# Patient Record
Sex: Female | Born: 1980 | Race: White | Hispanic: No | Marital: Single | Smoking: Current every day smoker
Health system: Southern US, Community
[De-identification: ages and names within clinical notes are randomized; demographics above are authoritative.]

## PROBLEM LIST (undated history)

## (undated) DIAGNOSIS — M5136 Other intervertebral disc degeneration, lumbar region: Secondary | ICD-10-CM

## (undated) DIAGNOSIS — G4700 Insomnia, unspecified: Secondary | ICD-10-CM

## (undated) DIAGNOSIS — F32A Depression, unspecified: Secondary | ICD-10-CM

## (undated) DIAGNOSIS — J189 Pneumonia, unspecified organism: Secondary | ICD-10-CM

## (undated) DIAGNOSIS — F329 Major depressive disorder, single episode, unspecified: Secondary | ICD-10-CM

## (undated) DIAGNOSIS — F419 Anxiety disorder, unspecified: Secondary | ICD-10-CM

## (undated) DIAGNOSIS — M51369 Other intervertebral disc degeneration, lumbar region without mention of lumbar back pain or lower extremity pain: Secondary | ICD-10-CM

## (undated) DIAGNOSIS — M5126 Other intervertebral disc displacement, lumbar region: Secondary | ICD-10-CM

## (undated) DIAGNOSIS — K8020 Calculus of gallbladder without cholecystitis without obstruction: Secondary | ICD-10-CM

## (undated) DIAGNOSIS — K8010 Calculus of gallbladder with chronic cholecystitis without obstruction: Secondary | ICD-10-CM

## (undated) DIAGNOSIS — Z8619 Personal history of other infectious and parasitic diseases: Secondary | ICD-10-CM

## (undated) HISTORY — PX: CHOLECYSTECTOMY: SHX55

## (undated) HISTORY — PX: BACK SURGERY: SHX140

## (undated) HISTORY — DX: Personal history of other infectious and parasitic diseases: Z86.19

---

## 2002-04-03 ENCOUNTER — Ambulatory Visit (HOSPITAL_COMMUNITY): Admission: RE | Admit: 2002-04-03 | Discharge: 2002-04-03 | Payer: Self-pay | Admitting: Family Medicine

## 2002-04-03 ENCOUNTER — Encounter: Payer: Self-pay | Admitting: Family Medicine

## 2002-09-04 ENCOUNTER — Emergency Department (HOSPITAL_COMMUNITY): Admission: EM | Admit: 2002-09-04 | Discharge: 2002-09-04 | Payer: Self-pay | Admitting: Emergency Medicine

## 2003-02-21 ENCOUNTER — Encounter: Payer: Self-pay | Admitting: Family Medicine

## 2003-02-21 ENCOUNTER — Encounter: Admission: RE | Admit: 2003-02-21 | Discharge: 2003-02-21 | Payer: Self-pay | Admitting: Family Medicine

## 2003-02-27 ENCOUNTER — Other Ambulatory Visit: Admission: RE | Admit: 2003-02-27 | Discharge: 2003-02-27 | Payer: Self-pay | Admitting: Family Medicine

## 2005-03-21 ENCOUNTER — Emergency Department (HOSPITAL_COMMUNITY): Admission: EM | Admit: 2005-03-21 | Discharge: 2005-03-21 | Payer: Self-pay | Admitting: Emergency Medicine

## 2005-04-16 ENCOUNTER — Emergency Department (HOSPITAL_COMMUNITY): Admission: EM | Admit: 2005-04-16 | Discharge: 2005-04-16 | Payer: Self-pay | Admitting: Emergency Medicine

## 2006-02-19 ENCOUNTER — Emergency Department (HOSPITAL_COMMUNITY): Admission: EM | Admit: 2006-02-19 | Discharge: 2006-02-19 | Payer: Self-pay | Admitting: Emergency Medicine

## 2007-03-17 ENCOUNTER — Emergency Department (HOSPITAL_COMMUNITY): Admission: EM | Admit: 2007-03-17 | Discharge: 2007-03-17 | Payer: Self-pay | Admitting: Emergency Medicine

## 2007-06-21 ENCOUNTER — Ambulatory Visit: Payer: Self-pay | Admitting: Internal Medicine

## 2007-06-21 LAB — CONVERTED CEMR LAB
AST: 20 units/L (ref 0–37)
Albumin: 4.1 g/dL (ref 3.5–5.2)
Alkaline Phosphatase: 56 units/L (ref 39–117)
BUN: 13 mg/dL (ref 6–23)
Basophils Absolute: 0.1 10*3/uL (ref 0.0–0.1)
Calcium: 9.3 mg/dL (ref 8.4–10.5)
Chloride: 107 meq/L (ref 96–112)
Creatinine, Ser: 0.7 mg/dL (ref 0.4–1.2)
Eosinophils Absolute: 0.3 10*3/uL (ref 0.0–0.6)
GFR calc non Af Amer: 108 mL/min
HCT: 43.7 % (ref 36.0–46.0)
MCHC: 34.3 g/dL (ref 30.0–36.0)
MCV: 89.7 fL (ref 78.0–100.0)
Monocytes Relative: 9.3 % (ref 3.0–11.0)
Platelets: 451 10*3/uL — ABNORMAL HIGH (ref 150–400)
RBC: 4.87 M/uL (ref 3.87–5.11)
RDW: 13.2 % (ref 11.5–14.6)
TSH: 2.14 microintl units/mL (ref 0.35–5.50)
Total Bilirubin: 0.9 mg/dL (ref 0.3–1.2)

## 2007-06-28 ENCOUNTER — Encounter: Admission: RE | Admit: 2007-06-28 | Discharge: 2007-06-28 | Payer: Self-pay | Admitting: Internal Medicine

## 2007-06-30 DIAGNOSIS — M545 Low back pain: Secondary | ICD-10-CM

## 2007-07-16 ENCOUNTER — Emergency Department (HOSPITAL_COMMUNITY): Admission: EM | Admit: 2007-07-16 | Discharge: 2007-07-16 | Payer: Self-pay | Admitting: Emergency Medicine

## 2007-08-27 ENCOUNTER — Other Ambulatory Visit: Admission: RE | Admit: 2007-08-27 | Discharge: 2007-08-27 | Payer: Self-pay | Admitting: Internal Medicine

## 2008-11-13 ENCOUNTER — Emergency Department (HOSPITAL_COMMUNITY): Admission: EM | Admit: 2008-11-13 | Discharge: 2008-11-13 | Payer: Self-pay | Admitting: Emergency Medicine

## 2009-10-08 ENCOUNTER — Emergency Department (HOSPITAL_COMMUNITY): Admission: EM | Admit: 2009-10-08 | Discharge: 2009-10-08 | Payer: Self-pay | Admitting: Emergency Medicine

## 2010-03-01 ENCOUNTER — Emergency Department (HOSPITAL_COMMUNITY): Admission: EM | Admit: 2010-03-01 | Discharge: 2010-03-02 | Payer: Self-pay | Admitting: Emergency Medicine

## 2010-03-06 ENCOUNTER — Emergency Department (HOSPITAL_COMMUNITY): Admission: EM | Admit: 2010-03-06 | Discharge: 2010-03-06 | Payer: Self-pay | Admitting: Emergency Medicine

## 2010-04-09 ENCOUNTER — Emergency Department: Payer: Self-pay | Admitting: Emergency Medicine

## 2010-08-19 ENCOUNTER — Emergency Department: Payer: Self-pay | Admitting: Emergency Medicine

## 2010-08-20 ENCOUNTER — Emergency Department (HOSPITAL_COMMUNITY): Admission: EM | Admit: 2010-08-20 | Discharge: 2010-08-20 | Payer: Self-pay | Admitting: Emergency Medicine

## 2010-10-04 ENCOUNTER — Inpatient Hospital Stay (HOSPITAL_COMMUNITY): Admission: AD | Admit: 2010-10-04 | Discharge: 2010-10-04 | Payer: Self-pay | Admitting: Obstetrics & Gynecology

## 2011-01-07 ENCOUNTER — Ambulatory Visit
Admission: RE | Admit: 2011-01-07 | Discharge: 2011-01-07 | Payer: Self-pay | Source: Home / Self Care | Attending: Internal Medicine | Admitting: Internal Medicine

## 2011-01-07 DIAGNOSIS — IMO0002 Reserved for concepts with insufficient information to code with codable children: Secondary | ICD-10-CM | POA: Insufficient documentation

## 2011-01-07 DIAGNOSIS — M5137 Other intervertebral disc degeneration, lumbosacral region: Secondary | ICD-10-CM | POA: Insufficient documentation

## 2011-01-15 NOTE — Assessment & Plan Note (Signed)
Summary: LAST APPT:  2008--BACK PAIN--STC   Vital Signs:  Patient profile:   30 year old female Height:      66 inches Weight:      194.50 pounds BMI:     31.51 O2 Sat:      97 % on Room air Temp:     98.5 degrees F oral Pulse rate:   121 / minute BP sitting:   132 / 80  (left arm) Cuff size:   regular  Vitals Entered By: Zella Ball Ewing CMA (AAMA) (January 07, 2011 3:04 PM)  O2 Flow:  Room air CC: Low Back Pain/RE   CC:  Low Back Pain/RE.  History of Present Illness: here to re-establish with recurrent episode of LBP , first episode since lumbar surgury in 2008, now 3 days severe, constant, persistent, left sided with radicaular pain to the calf; no numbness but ? mild worsening weakness (but was not back to 100% after the first surgury);  No fever, trauma, wt loss but does do fairfly freq bending with job. No falls.  No bowel or bladder changes.  Pt denies CP, worsening sob, doe, wheezing, orthopnea, pnd, worsening LE edema, palps, dizziness or syncope  Pt denies other new neuro symptoms such as headache, facial weakness.  standing makes better, sitting for over several mintues makes worse.  There are mult family members, cousins et al with similar issues and hx of lumbar surgury in their 20's  Preventive Screening-Counseling & Management  Alcohol-Tobacco     Smoking Status: current      Drug Use:  no.    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 mother with HTN grandmother with lung cancer grandfather with heart disease - died at 30yo  Social History: Reviewed history and no changes required. 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   Head:  normocephalic and atraumatic.   Eyes:  vision grossly intact, pupils equal, and pupils round.   Ears:  R ear normal and L ear normal.   Nose:  no external deformity and no nasal discharge.   Mouth:  no gingival abnormalities and pharynx pink and moist.   Neck:  supple and no masses.   Lungs:  normal respiratory effort and normal breath sounds.   Heart:  normal rate and regular rhythm.   Abdomen:  soft,  non-tender, and normal bowel sounds.   Msk:  no joint tenderness and no joint swelling.  , no lumbar or paravertebral tenderness, swelling , erythema or rash Extremities:  no edema, no erythema  Neurologic:  cranial nerves II-XII intact, strength normal in all extremities, sensation intact to light touch, and DTRs symmetrical and normal.  except for very mild LLE diffuse weakness, difficult to asses due to pain Psych:  not depressed appearing and slightly anxious.     Impression & Recommendations:  Problem # 1:  LUMBAR RADICULOPATHY, LEFT (ICD-724.4) acute, recurrent episode similar to prior 2008 culminating in surgury; suspect recurrent disc herniation ;  ok for  pain med, but ideally should have MRI and ortho referral;  pt will call her insurance to see if will cover the MRI and let us know as she has "partial insurance" with her current part time job, and to find out name of former ortho at [facility] who did so well for her with the previous surgury; so that we can order the MRI and do the referral which are appropriate  Her updated medication list for this problem includes:    Oxycodone Hcl 5 Mg Tabs (Oxycodone hcl) .Marland Kitchen... 1-2 by mouth q 6 hrs as needed pain  Complete Medication List: 1)  Oxycodone Hcl 5 Mg Tabs (Oxycodone hcl) .Marland Kitchen.. 1-2 by mouth q 6 hrs as needed pain  Patient Instructions: 1)  Please take all new medications as prescribed 2)  Continue all previous medications as before this visit  3)  Please let us know if you would like me to order the MRI, or have it done at [facility] 4)  Please let us know the name of the orthopedic at [facility] so that we can do the referral 5)  Continue all previous medications as before this visit  6)  Please schedule a follow-up appointment as needed. Prescriptions: OXYCODONE HCL 5 MG TABS (OXYCODONE HCL) 1-2 by mouth q 6 hrs as needed pain  #60 x 0   Entered and Authorized by:   Corwin Levins MD   Signed by:   Corwin Levins MD on 01/07/2011   Method used:   Print then Give to Patient   RxID:   [id number]    Orders Added: 1)  New Patient Level IV [86578]

## 2011-01-19 ENCOUNTER — Telehealth: Payer: Self-pay | Admitting: Internal Medicine

## 2011-01-20 ENCOUNTER — Encounter: Payer: Self-pay | Admitting: Internal Medicine

## 2011-02-03 ENCOUNTER — Telehealth: Payer: Self-pay | Admitting: Internal Medicine

## 2011-02-04 NOTE — Progress Notes (Signed)
Summary: MRI, Refill  Phone Note Call from Patient Call back at Home Phone [phone]   Caller: Patient Summary of Call: Pt called requesting referral to have MRI done (not at [facility]) in 6 weeks when her Insurance will be active. Pt is also requesting a refill of Oxycodone. Initial call taken by: Margaret Pyle, CMA,  January 19, 2011 2:46 PM  Follow-up for Phone Call        ok for refill now;  pt should call at the approp time and I will order the MRI Follow-up by: Corwin Levins MD,  January 19, 2011 4:59 PM  Additional Follow-up for Phone Call Additional follow up Details #1::        both pt's home and work numbers are disconnected. Rx in cabinet for pick up. Margaret Pyle, CMA  January 20, 2011 8:10 AM     Prescriptions: OXYCODONE HCL 5 MG TABS (OXYCODONE HCL) 1-2 by mouth q 6 hrs as needed pain  #80 x 0   Entered and Authorized by:   Corwin Levins MD   Signed by:   Corwin Levins MD on 01/19/2011   Method used:   Print then Give to Patient   RxID:   [id number]

## 2011-02-10 NOTE — Medication Information (Signed)
Summary: Rx History  Rx History   Imported By: Lester Alma 02/05/2011 11:01:53  _____________________________________________________________________  External Attachment:    Type:   Image     Comment:   External Document

## 2011-02-12 ENCOUNTER — Ambulatory Visit: Payer: Self-pay | Admitting: Internal Medicine

## 2011-02-12 DIAGNOSIS — Z0289 Encounter for other administrative examinations: Secondary | ICD-10-CM

## 2011-02-19 NOTE — Progress Notes (Signed)
Summary: Oxycodone  Phone Note Call from Patient Call back at Home Phone [phone]   Caller: Patient Summary of Call: Pt called requesting refill of Oxycodone Initial call taken by: Margaret Pyle, CMA,  February 03, 2011 2:37 PM  Follow-up for Phone Call        has the MRI been done? Follow-up by: Corwin Levins MD,  February 03, 2011 3:01 PM  Additional Follow-up for Phone Call Additional follow up Details #1::        Pt stated 02/06 that her Insurance will be active in 6 weeks Additional Follow-up by: Margaret Pyle, CMA,  February 03, 2011 3:13 PM    Additional Follow-up for Phone Call Additional follow up Details #2::    I am uncomfortable with continuing pain med as before for another 6 wks   can pt provide documentation that her insurance will be "active" on a specific date (such as a note from an HR person?) Follow-up by: Corwin Levins MD,  February 03, 2011 5:46 PM  Additional Follow-up for Phone Call Additional follow up Details #3:: Details for Additional Follow-up Action Taken: Pt advised and will request above information form her HR dept. Margaret Pyle, CMA  February 04, 2011 10:52 AM   Pt called stating that she has ben laid off and no longer has full insurance (partial insurance from other part time job only, that will not cover MRI per pt). Pt states she will check if insurance will cover Ortho but she says she does not think so. Pt is requesting Rx of oxycodone until she is "able to figure things out because this is affecting my quality if life". Please advise. Margaret Pyle, CMA  February 09, 2011 4:31 PM   Prescriptions: OXYCODONE HCL 5 MG TABS (OXYCODONE HCL) 1-2 by mouth q 6 hrs as needed pain  #80 x 0   Entered and Authorized by:   Corwin Levins MD   Signed by:   Corwin Levins MD on 02/09/2011   Method used:   Print then Give to Patient   RxID:   [id number]   ok for one med refill only, then we have to insist on  tapering off (such as change to tramadol with next refill)  as I cannot continue pain meds she has indefinitely without further eval and tx, as this is not good medical practice Corwin Levins MD  February 09, 2011 5:07 PM   left message on machine for pt to return my call. Margaret Pyle, CMA  February 10, 2011 8:39 AM  Pt advised ain detail and expressed understanding, Rx in cabinet for pt pick up Margaret Pyle, CMA  February 10, 2011 11:33 AM

## 2011-02-25 LAB — HCG, QUANTITATIVE, PREGNANCY: hCG, Beta Chain, Quant, S: 434 m[IU]/mL — ABNORMAL HIGH (ref <5)

## 2011-07-26 ENCOUNTER — Emergency Department (HOSPITAL_COMMUNITY)
Admission: EM | Admit: 2011-07-26 | Discharge: 2011-07-26 | Disposition: A | Discharge status: Home / Self Care | Payer: PRIVATE HEALTH INSURANCE | Attending: Emergency Medicine | Admitting: Emergency Medicine

## 2011-07-26 DIAGNOSIS — L0231 Cutaneous abscess of buttock: Secondary | ICD-10-CM | POA: Insufficient documentation

## 2011-07-30 LAB — WOUND CULTURE

## 2011-11-19 LAB — OB RESULTS CONSOLE GC/CHLAMYDIA
Chlamydia: NEGATIVE
Gonorrhea: NEGATIVE

## 2011-11-19 LAB — OB RESULTS CONSOLE ABO/RH

## 2011-11-19 LAB — OB RESULTS CONSOLE ANTIBODY SCREEN: Antibody Screen: NEGATIVE

## 2011-11-19 LAB — OB RESULTS CONSOLE RUBELLA ANTIBODY, IGM: Rubella: IMMUNE

## 2011-11-19 LAB — OB RESULTS CONSOLE HEPATITIS B SURFACE ANTIGEN: Hepatitis B Surface Ag: NEGATIVE

## 2011-12-15 NOTE — L&D Delivery Note (Signed)
Patient was C/C/2 and pushed for approx 2 hrs with epidural.   NSVD female infant, Apgars 8/9, weight 7#10.   The patient had a second laceration repaired with 2-0 vicryl. Fundus was firm. EBL approx 400cc Placenta was delivered, with abundant meconium stained membranes found adherent to uterine wall.  Manual removal performed. Vagina was clear.  Baby was vigorous to bedside.  Crystal Acosta

## 2012-05-02 LAB — OB RESULTS CONSOLE GBS: GBS: NEGATIVE

## 2012-05-31 ENCOUNTER — Encounter (HOSPITAL_COMMUNITY): Payer: Self-pay | Admitting: *Deleted

## 2012-05-31 ENCOUNTER — Telehealth (HOSPITAL_COMMUNITY): Payer: Self-pay | Admitting: *Deleted

## 2012-05-31 NOTE — Telephone Encounter (Signed)
Preadmission screen  

## 2012-06-05 ENCOUNTER — Encounter (HOSPITAL_COMMUNITY): Payer: Self-pay

## 2012-06-05 ENCOUNTER — Inpatient Hospital Stay (HOSPITAL_COMMUNITY)
Admission: RE | Admit: 2012-06-05 | Discharge: 2012-06-08 | DRG: 774 | Disposition: A | Discharge status: Home / Self Care | Payer: Medicaid Other | Source: Ambulatory Visit | Attending: Obstetrics and Gynecology | Admitting: Obstetrics and Gynecology

## 2012-06-05 VITALS — BP 98/66 | HR 85 | Temp 97.7°F | Resp 18 | Ht 66.0 in | Wt 222.0 lb

## 2012-06-05 DIAGNOSIS — O411090 Infection of amniotic sac and membranes, unspecified, unspecified trimester, not applicable or unspecified: Secondary | ICD-10-CM | POA: Diagnosis present

## 2012-06-05 DIAGNOSIS — O480 Post-term pregnancy: Secondary | ICD-10-CM

## 2012-06-05 LAB — TYPE AND SCREEN
ABO/RH(D): O POS
Antibody Screen: NEGATIVE

## 2012-06-05 LAB — CBC
MCV: 87.7 fL (ref 78.0–100.0)
Platelets: 378 10*3/uL (ref 150–400)
RBC: 4.47 MIL/uL (ref 3.87–5.11)
WBC: 14.8 10*3/uL — ABNORMAL HIGH (ref 4.0–10.5)

## 2012-06-05 MED ORDER — IBUPROFEN 600 MG PO TABS: 600.0000 mg | ORAL_TABLET | Freq: Four times a day (QID) | ORAL | Status: DC | PRN

## 2012-06-05 MED ORDER — MISOPROSTOL 25 MCG QUARTER TABLET
25.0000 ug | ORAL_TABLET | ORAL | Status: DC | PRN
  Administered 2012-06-05: 25 ug via VAGINAL
  Filled 2012-06-05: qty 1
  Filled 2012-06-05: qty 0.25

## 2012-06-05 MED ORDER — LACTATED RINGERS IV SOLN
INTRAVENOUS | Status: DC
  Administered 2012-06-05 – 2012-06-06 (×2): via INTRAVENOUS
  Administered 2012-06-06: 125 mL/h via INTRAVENOUS

## 2012-06-05 MED ORDER — ZOLPIDEM TARTRATE 10 MG PO TABS
10.0000 mg | ORAL_TABLET | Freq: Every evening | ORAL | Status: DC | PRN
  Administered 2012-06-05: 10 mg via ORAL
  Filled 2012-06-05: qty 1

## 2012-06-05 MED ORDER — LIDOCAINE HCL (PF) 1 % IJ SOLN
30.0000 mL | INTRAMUSCULAR | Status: DC | PRN
  Filled 2012-06-05: qty 30

## 2012-06-05 MED ORDER — ACETAMINOPHEN 325 MG PO TABS
650.0000 mg | ORAL_TABLET | ORAL | Status: DC | PRN
  Administered 2012-06-06 – 2012-06-07 (×2): 650 mg via ORAL
  Filled 2012-06-05 (×2): qty 2

## 2012-06-05 MED ORDER — CITRIC ACID-SODIUM CITRATE 334-500 MG/5ML PO SOLN: 30.0000 mL | ORAL | Status: DC | PRN

## 2012-06-05 MED ORDER — ONDANSETRON HCL 4 MG/2ML IJ SOLN
4.0000 mg | Freq: Four times a day (QID) | INTRAMUSCULAR | Status: DC | PRN
  Administered 2012-06-06 (×2): 4 mg via INTRAVENOUS
  Filled 2012-06-05 (×2): qty 2

## 2012-06-05 MED ORDER — OXYTOCIN BOLUS FROM INFUSION
250.0000 mL | Freq: Once | INTRAVENOUS | Status: AC
  Administered 2012-06-06: 250 mL via INTRAVENOUS
  Filled 2012-06-05: qty 500

## 2012-06-05 MED ORDER — LACTATED RINGERS IV SOLN
500.0000 mL | INTRAVENOUS | Status: DC | PRN
  Administered 2012-06-06 (×2): 500 mL via INTRAVENOUS

## 2012-06-05 MED ORDER — OXYTOCIN 40 UNITS IN LACTATED RINGERS INFUSION - SIMPLE MED
62.5000 mL/h | Freq: Once | INTRAVENOUS | Status: AC
  Administered 2012-06-06: 2.5 [IU]/h via INTRAVENOUS
  Filled 2012-06-05: qty 1000

## 2012-06-05 MED ORDER — FLEET ENEMA 7-19 GM/118ML RE ENEM: 1.0000 | ENEMA | RECTAL | Status: DC | PRN

## 2012-06-05 MED ORDER — OXYCODONE-ACETAMINOPHEN 5-325 MG PO TABS
1.0000 | ORAL_TABLET | ORAL | Status: DC | PRN
  Administered 2012-06-07 (×2): 1 via ORAL
  Filled 2012-06-05 (×2): qty 1

## 2012-06-06 ENCOUNTER — Encounter (HOSPITAL_COMMUNITY): Payer: Self-pay | Admitting: Anesthesiology

## 2012-06-06 ENCOUNTER — Inpatient Hospital Stay (HOSPITAL_COMMUNITY): Payer: Medicaid Other | Admitting: Anesthesiology

## 2012-06-06 ENCOUNTER — Encounter (HOSPITAL_COMMUNITY): Payer: Self-pay

## 2012-06-06 MED ORDER — PHENYLEPHRINE 40 MCG/ML (10ML) SYRINGE FOR IV PUSH (FOR BLOOD PRESSURE SUPPORT): 80.0000 ug | PREFILLED_SYRINGE | INTRAVENOUS | Status: DC | PRN

## 2012-06-06 MED ORDER — TERBUTALINE SULFATE 1 MG/ML IJ SOLN: 0.2500 mg | Freq: Once | INTRAMUSCULAR | Status: AC | PRN

## 2012-06-06 MED ORDER — DIPHENHYDRAMINE HCL 50 MG/ML IJ SOLN: 12.5000 mg | INTRAMUSCULAR | Status: DC | PRN

## 2012-06-06 MED ORDER — BUTORPHANOL TARTRATE 2 MG/ML IJ SOLN
1.0000 mg | INTRAMUSCULAR | Status: DC | PRN
  Administered 2012-06-06 (×2): 1 mg via INTRAVENOUS
  Filled 2012-06-06 (×2): qty 1

## 2012-06-06 MED ORDER — DEXTROSE 5 % IV SOLN
150.0000 mg | Freq: Three times a day (TID) | INTRAVENOUS | Status: DC
  Administered 2012-06-06: 150 mg via INTRAVENOUS
  Filled 2012-06-06 (×3): qty 3.75

## 2012-06-06 MED ORDER — OXYTOCIN 40 UNITS IN LACTATED RINGERS INFUSION - SIMPLE MED
1.0000 m[IU]/min | INTRAVENOUS | Status: DC
  Administered 2012-06-06: 1 m[IU]/min via INTRAVENOUS

## 2012-06-06 MED ORDER — LACTATED RINGERS IV SOLN
500.0000 mL | Freq: Once | INTRAVENOUS | Status: AC
  Administered 2012-06-06: 500 mL via INTRAVENOUS

## 2012-06-06 MED ORDER — FENTANYL 2.5 MCG/ML BUPIVACAINE 1/10 % EPIDURAL INFUSION (WH - ANES)
14.0000 mL/h | INTRAMUSCULAR | Status: DC
  Administered 2012-06-06 (×5): 14 mL/h via EPIDURAL
  Filled 2012-06-06 (×5): qty 60

## 2012-06-06 MED ORDER — EPHEDRINE 5 MG/ML INJ: 10.0000 mg | INTRAVENOUS | Status: DC | PRN

## 2012-06-06 MED ORDER — EPHEDRINE 5 MG/ML INJ
10.0000 mg | INTRAVENOUS | Status: DC | PRN
  Filled 2012-06-06 (×2): qty 4

## 2012-06-06 MED ORDER — OXYTOCIN 40 UNITS IN LACTATED RINGERS INFUSION - SIMPLE MED: 1.0000 m[IU]/min | INTRAVENOUS | Status: DC

## 2012-06-06 MED ORDER — PHENYLEPHRINE 40 MCG/ML (10ML) SYRINGE FOR IV PUSH (FOR BLOOD PRESSURE SUPPORT)
80.0000 ug | PREFILLED_SYRINGE | INTRAVENOUS | Status: DC | PRN
  Filled 2012-06-06 (×2): qty 5

## 2012-06-06 MED ORDER — LIDOCAINE HCL (PF) 1 % IJ SOLN
INTRAMUSCULAR | Status: DC | PRN
  Administered 2012-06-06 (×2): 5 mL

## 2012-06-06 MED ORDER — SODIUM BICARBONATE 8.4 % IV SOLN
INTRAVENOUS | Status: DC | PRN
  Administered 2012-06-06: 3 mL via EPIDURAL

## 2012-06-06 MED ORDER — SODIUM CHLORIDE 0.9 % IV SOLN
2.0000 g | Freq: Four times a day (QID) | INTRAVENOUS | Status: DC
  Administered 2012-06-06: 2 g via INTRAVENOUS
  Filled 2012-06-06 (×4): qty 2000

## 2012-06-06 NOTE — Anesthesia Procedure Notes (Signed)
Epidural Patient location during procedure: OB Start time: 06/06/2012 7:38 AM  Staffing Anesthesiologist: Brayton Caves R Performed by: anesthesiologist   Preanesthetic Checklist Completed: patient identified, site marked, surgical consent, pre-op evaluation, timeout performed, IV checked, risks and benefits discussed and monitors and equipment checked  Epidural Patient position: sitting Prep: site prepped and draped and DuraPrep Patient monitoring: continuous pulse ox and blood pressure Approach: midline Injection technique: LOR air and LOR saline  Needle:  Needle type: Tuohy  Needle gauge: 17 G Needle length: 9 cm Needle insertion depth: 6 cm Catheter type: closed end flexible Catheter size: 19 Gauge Catheter at skin depth: 11 cm Test dose: negative  Assessment Events: blood not aspirated, injection not painful, no injection resistance, negative IV test and no paresthesia  Additional Notes Patient identified.  Risk benefits discussed including failed block, incomplete pain control, headache, nerve damage, paralysis, blood pressure changes, nausea, vomiting, reactions to medication both toxic or allergic, and postpartum back pain.  Patient expressed understanding and wished to proceed.  All questions were answered.  Sterile technique used throughout procedure and epidural site dressed with sterile barrier dressing. No paresthesia or other complications noted.The patient did not experience any signs of intravascular injection such as tinnitus or metallic taste in mouth nor signs of intrathecal spread such as rapid motor block. Please see nursing notes for vital signs.

## 2012-06-06 NOTE — Progress Notes (Signed)
Pt feeling pressure with contractions. FHT 150 mid to mod var with early variable decels TOCO q2 SVE 9/100/+1 --no change in 2 hrs Alerted pt with temp of 101.3 Chorioamnionitis -  Start Ampicillin 2g q6 and Gentamicin 1.5mg /kg q8 Tylenol for fever Cont current mgmt. If no change at next exam will discuss c/s.

## 2012-06-06 NOTE — Progress Notes (Signed)
Pt comfortable s/p epidural. Pit @9  FHT 125 mod var +accels, period of decel/not picking up well after last cervix check, now resolved and reassurring TOCO q2-3 SVE 6/90/0 Cont pitocin, current mgmt

## 2012-06-06 NOTE — H&P (Signed)
31 y.o. [redacted]w[redacted]d  G3P0020 comes in for schedule IOL due to past dates.  Otherwise has good fetal movement and no bleeding.  Past Medical History  Diagnosis Date  . H/O varicella     Past Surgical History  Procedure Date  . Back surgery     OB History    Grav Para Term Preterm Abortions TAB SAB Ect Mult Living   3 0   2  2   0     # Outc Date GA Lbr Len/2nd Wgt Sex Del Anes PTL Lv   1 SAB 2010           2 SAB 2011           3 CUR               History   Social History  . Marital Status: Single    Spouse Name: N/A    Number of Children: N/A  . Years of Education: N/A   Occupational History  . Not on file.   Social History Main Topics  . Smoking status: Former Games developer  . Smokeless tobacco: Never Used  . Alcohol Use: No  . Drug Use: No  . Sexually Active:    Other Topics Concern  . Not on file   Social History Narrative  . No narrative on file   Review of patient's allergies indicates no known allergies.   Prenatal Course:  MOS, smoker, history of back surgery due to buglding disc.  Filed Vitals:   06/06/12 0900  BP: 115/64  Pulse: 78  Temp:   Resp: 18     Lungs/Cor:  NAD Abdomen:  soft, gravid Ex:  no cords, erythema SVE:  1/50/-2 at admission - now 1/70/-2/soft/midposition BS =5 FHTs:  125, good STV, NST R Toco:  q4-5   A/P   Admit to L&D for IOL Pt received one dose of misoprostol overnight, but was contracting to frequently to receive subsequent doses.  Ctx became strong and painful, patient received epidural and is now comfortable.  Still ctx too frequently for cytotec.  At next cervical check will consider starting low dose pitocin if still ctx to often.  GBS Neg  Naven Giambalvo

## 2012-06-06 NOTE — Anesthesia Preprocedure Evaluation (Signed)

## 2012-06-07 LAB — CBC
HCT: 35.5 % — ABNORMAL LOW (ref 36.0–46.0)
MCH: 29.7 pg (ref 26.0–34.0)
MCV: 89.4 fL (ref 78.0–100.0)
Platelets: 325 10*3/uL (ref 150–400)
RDW: 15.2 % (ref 11.5–15.5)

## 2012-06-07 MED ORDER — PRENATAL MULTIVITAMIN CH
1.0000 | ORAL_TABLET | Freq: Every day | ORAL | Status: DC
  Administered 2012-06-07 – 2012-06-08 (×2): 1 via ORAL
  Filled 2012-06-07 (×2): qty 1

## 2012-06-07 MED ORDER — ZOLPIDEM TARTRATE 5 MG PO TABS
5.0000 mg | ORAL_TABLET | Freq: Every evening | ORAL | Status: DC | PRN
  Administered 2012-06-07: 5 mg via ORAL
  Filled 2012-06-07: qty 1

## 2012-06-07 MED ORDER — TETANUS-DIPHTH-ACELL PERTUSSIS 5-2.5-18.5 LF-MCG/0.5 IM SUSP
0.5000 mL | Freq: Once | INTRAMUSCULAR | Status: AC
  Administered 2012-06-07: 0.5 mL via INTRAMUSCULAR
  Filled 2012-06-07: qty 0.5

## 2012-06-07 MED ORDER — SENNOSIDES-DOCUSATE SODIUM 8.6-50 MG PO TABS
2.0000 | ORAL_TABLET | Freq: Every day | ORAL | Status: DC
  Administered 2012-06-07: 2 via ORAL

## 2012-06-07 MED ORDER — GENTAMICIN SULFATE 40 MG/ML IJ SOLN
150.0000 mg | Freq: Once | INTRAVENOUS | Status: AC
  Administered 2012-06-07: 150 mg via INTRAVENOUS
  Filled 2012-06-07: qty 3.75

## 2012-06-07 MED ORDER — LACTATED RINGERS IV SOLN
INTRAVENOUS | Status: DC
  Administered 2012-06-07: 03:00:00 via INTRAVENOUS

## 2012-06-07 MED ORDER — LANOLIN HYDROUS EX OINT: TOPICAL_OINTMENT | CUTANEOUS | Status: DC | PRN

## 2012-06-07 MED ORDER — SODIUM CHLORIDE 0.9 % IV SOLN
2.0000 g | Freq: Once | INTRAVENOUS | Status: AC
  Administered 2012-06-07: 2 g via INTRAVENOUS
  Filled 2012-06-07: qty 2000

## 2012-06-07 MED ORDER — ONDANSETRON HCL 4 MG/2ML IJ SOLN: 4.0000 mg | INTRAMUSCULAR | Status: DC | PRN

## 2012-06-07 MED ORDER — SIMETHICONE 80 MG PO CHEW: 80.0000 mg | CHEWABLE_TABLET | ORAL | Status: DC | PRN

## 2012-06-07 MED ORDER — ONDANSETRON HCL 4 MG PO TABS: 4.0000 mg | ORAL_TABLET | ORAL | Status: DC | PRN

## 2012-06-07 MED ORDER — WITCH HAZEL-GLYCERIN EX PADS: 1.0000 "application " | MEDICATED_PAD | CUTANEOUS | Status: DC | PRN

## 2012-06-07 MED ORDER — OXYCODONE-ACETAMINOPHEN 5-325 MG PO TABS
1.0000 | ORAL_TABLET | ORAL | Status: DC | PRN
  Administered 2012-06-07 (×2): 2 via ORAL
  Administered 2012-06-07 (×2): 1 via ORAL
  Administered 2012-06-08 (×2): 2 via ORAL
  Filled 2012-06-07 (×2): qty 1
  Filled 2012-06-07 (×4): qty 2

## 2012-06-07 MED ORDER — DIPHENHYDRAMINE HCL 25 MG PO CAPS: 25.0000 mg | ORAL_CAPSULE | Freq: Four times a day (QID) | ORAL | Status: DC | PRN

## 2012-06-07 MED ORDER — IBUPROFEN 600 MG PO TABS
600.0000 mg | ORAL_TABLET | Freq: Four times a day (QID) | ORAL | Status: DC
  Administered 2012-06-07 – 2012-06-08 (×6): 600 mg via ORAL
  Filled 2012-06-07 (×6): qty 1

## 2012-06-07 MED ORDER — DIBUCAINE 1 % RE OINT: 1.0000 "application " | TOPICAL_OINTMENT | RECTAL | Status: DC | PRN

## 2012-06-07 MED ORDER — BENZOCAINE-MENTHOL 20-0.5 % EX AERO
1.0000 "application " | INHALATION_SPRAY | CUTANEOUS | Status: DC | PRN
  Administered 2012-06-07: 1 via TOPICAL
  Filled 2012-06-07: qty 56

## 2012-06-07 NOTE — Progress Notes (Signed)
UR chart review completed.  

## 2012-06-07 NOTE — Anesthesia Postprocedure Evaluation (Signed)
  Anesthesia Post-op Note  Patient: Crystal Acosta  Procedure(s) Performed: * No procedures listed *  Patient Location: Mother/KBaby Baby  Anesthesia Type: Epidural  Level of Consciousness: awake  Airway and Oxygen Therapy: Patient Spontanous Breathing  Post-op Pain: none  Post-op Assessment: Post-op Vital signs reviewed  Post-op Vital Signs: Reviewed and stable  Complications: No apparent anesthesia complications

## 2012-06-07 NOTE — Progress Notes (Signed)
PPD#1 Pt without complaints. Lochia-wnl Tmax- 102.4, no fever today WBC-34.4 ABD- nontender IMP/Stable Plan/ routine care

## 2012-06-08 MED ORDER — IBUPROFEN 600 MG PO TABS: 600.0000 mg | ORAL_TABLET | Freq: Four times a day (QID) | ORAL | Status: AC | PRN

## 2012-06-08 MED ORDER — OXYCODONE-ACETAMINOPHEN 5-325 MG PO TABS: 2.0000 | ORAL_TABLET | ORAL | Status: AC | PRN

## 2012-06-08 NOTE — Discharge Summary (Signed)
Obstetric Discharge Summary Reason for Admission: induction of labor Prenatal Procedures: NST and ultrasound Intrapartum Procedures: spontaneous vaginal delivery Postpartum Procedures: none Complications-Operative and Postpartum: 2nd degree perineal laceration Hemoglobin  Date Value Range Status  06/07/2012 11.8* 12.0 - 15.0 g/dL Final     HCT  Date Value Range Status  06/07/2012 35.5* 36.0 - 46.0 % Final    Physical Exam:  General: alert, cooperative and appears stated age Lochia: appropriate Uterine Fundus: firm   Discharge Diagnoses: Term Pregnancy-delivered  Discharge Information: Date: 06/08/2012 Activity: pelvic rest Diet: routine Medications: Ibuprofen and Percocet Condition: stable Instructions: refer to practice specific booklet Discharge to: home Follow-up Information    Follow up with CALLAHAN, SIDNEY, DO. Call in 4 weeks. (For a postpartum evaluation)    Contact information:   [address] [facility] [place] [address] [phone]          Newborn Data: Live born female  Birth Weight: 7 lb 10.2 oz (3464 g) APGAR: 8, 9  Home with mother.  Kadence Mikkelson H. 06/08/2012, 9:29 AM

## 2012-06-12 ENCOUNTER — Inpatient Hospital Stay (HOSPITAL_COMMUNITY): Admission: AD | Admit: 2012-06-12 | Payer: Self-pay | Source: Ambulatory Visit | Admitting: Obstetrics and Gynecology

## 2012-06-21 ENCOUNTER — Emergency Department (HOSPITAL_COMMUNITY)
Admission: EM | Admit: 2012-06-21 | Discharge: 2012-06-21 | Disposition: A | Discharge status: Home / Self Care | Payer: Medicaid Other | Attending: Emergency Medicine | Admitting: Emergency Medicine

## 2012-06-21 ENCOUNTER — Encounter (HOSPITAL_COMMUNITY): Payer: Self-pay | Admitting: Emergency Medicine

## 2012-06-21 ENCOUNTER — Emergency Department (HOSPITAL_COMMUNITY): Payer: Medicaid Other

## 2012-06-21 DIAGNOSIS — O99893 Other specified diseases and conditions complicating puerperium: Secondary | ICD-10-CM | POA: Insufficient documentation

## 2012-06-21 DIAGNOSIS — K829 Disease of gallbladder, unspecified: Secondary | ICD-10-CM | POA: Insufficient documentation

## 2012-06-21 DIAGNOSIS — R109 Unspecified abdominal pain: Secondary | ICD-10-CM | POA: Insufficient documentation

## 2012-06-21 DIAGNOSIS — K8020 Calculus of gallbladder without cholecystitis without obstruction: Secondary | ICD-10-CM | POA: Insufficient documentation

## 2012-06-21 DIAGNOSIS — R300 Dysuria: Secondary | ICD-10-CM | POA: Insufficient documentation

## 2012-06-21 DIAGNOSIS — R112 Nausea with vomiting, unspecified: Secondary | ICD-10-CM | POA: Insufficient documentation

## 2012-06-21 DIAGNOSIS — R10819 Abdominal tenderness, unspecified site: Secondary | ICD-10-CM | POA: Insufficient documentation

## 2012-06-21 LAB — CBC WITH DIFFERENTIAL/PLATELET
Basophils Absolute: 0 10*3/uL (ref 0.0–0.1)
Basophils Relative: 0 % (ref 0–1)
Eosinophils Absolute: 0.2 10*3/uL (ref 0.0–0.7)
HCT: 42.3 % (ref 36.0–46.0)
Hemoglobin: 14.4 g/dL (ref 12.0–15.0)
MCH: 29.9 pg (ref 26.0–34.0)
MCHC: 34 g/dL (ref 30.0–36.0)
Monocytes Absolute: 0.5 10*3/uL (ref 0.1–1.0)
Monocytes Relative: 5 % (ref 3–12)
Neutro Abs: 6.8 10*3/uL (ref 1.7–7.7)
Neutrophils Relative %: 71 % (ref 43–77)
RDW: 13.9 % (ref 11.5–15.5)

## 2012-06-21 LAB — COMPREHENSIVE METABOLIC PANEL
AST: 15 U/L (ref 0–37)
Albumin: 3.6 g/dL (ref 3.5–5.2)
BUN: 9 mg/dL (ref 6–23)
Chloride: 105 mEq/L (ref 96–112)
Creatinine, Ser: 0.66 mg/dL (ref 0.50–1.10)
Total Bilirubin: 0.5 mg/dL (ref 0.3–1.2)
Total Protein: 7.3 g/dL (ref 6.0–8.3)

## 2012-06-21 LAB — URINALYSIS, ROUTINE W REFLEX MICROSCOPIC
Hgb urine dipstick: NEGATIVE
Nitrite: NEGATIVE
Specific Gravity, Urine: 1.021 (ref 1.005–1.030)
Urobilinogen, UA: 0.2 mg/dL (ref 0.0–1.0)
pH: 7.5 (ref 5.0–8.0)

## 2012-06-21 LAB — LIPASE, BLOOD: Lipase: 21 U/L (ref 11–59)

## 2012-06-21 MED ORDER — ONDANSETRON HCL 4 MG/2ML IJ SOLN
4.0000 mg | Freq: Once | INTRAMUSCULAR | Status: AC
  Administered 2012-06-21: 4 mg via INTRAVENOUS
  Filled 2012-06-21: qty 2

## 2012-06-21 MED ORDER — HYDROMORPHONE HCL PF 1 MG/ML IJ SOLN
1.0000 mg | Freq: Once | INTRAMUSCULAR | Status: AC
  Administered 2012-06-21: 1 mg via INTRAVENOUS
  Filled 2012-06-21: qty 1

## 2012-06-21 MED ORDER — ONDANSETRON 8 MG PO TBDP: 8.0000 mg | ORAL_TABLET | Freq: Three times a day (TID) | ORAL | Status: AC | PRN

## 2012-06-21 MED ORDER — OXYCODONE-ACETAMINOPHEN 7.5-325 MG PO TABS: 1.0000 | ORAL_TABLET | ORAL | Status: DC | PRN

## 2012-06-21 MED ORDER — SODIUM CHLORIDE 0.9 % IV SOLN
Freq: Once | INTRAVENOUS | Status: AC
  Administered 2012-06-21: 11:00:00 via INTRAVENOUS

## 2012-06-21 MED ORDER — ONDANSETRON 8 MG PO TBDP
8.0000 mg | ORAL_TABLET | Freq: Once | ORAL | Status: AC
  Administered 2012-06-21: 8 mg via ORAL
  Filled 2012-06-21: qty 1

## 2012-06-21 MED ORDER — HYDROMORPHONE HCL PF 1 MG/ML IJ SOLN
2.0000 mg | Freq: Once | INTRAMUSCULAR | Status: AC
  Administered 2012-06-21: 2 mg via INTRAVENOUS
  Filled 2012-06-21: qty 2

## 2012-06-21 NOTE — ED Notes (Signed)
[id number]<AV> Expected date:<BR> Expected time:<BR> Means of arrival:<BR> Comments:<BR> Closed

## 2012-06-21 NOTE — ED Notes (Signed)
States that this am she was awaken with right flank pain and dysuria. States that she tries to urinate and it burns badly. States that she has nausea and vomited before arrival.

## 2012-06-21 NOTE — ED Provider Notes (Addendum)
History     CSN: [id number]  Arrival date & time 06/21/12  6213   First MD Initiated Contact with Patient 06/21/12 [phone]      Chief Complaint  Patient presents with  . Flank Pain  . Nausea  . Emesis  . Dysuria    (Consider location/radiation/quality/duration/timing/severity/associated sxs/prior treatment) Patient is a 31 y.o. female presenting with flank pain, vomiting, and dysuria. The history is provided by the patient.  Flank Pain  Emesis   Dysuria  Associated symptoms include vomiting and flank pain.   patient here with colicky right-sided abdominal pain x1 day. No fever or vomiting. Does note nausea. No association with food. Pain starts in her right upper to mid abdomen and radiates to her back. Pain has been colicky and nothing makes it better or worse. No hematuria. No medications taken prior to arrival  Past Medical History  Diagnosis Date  . H/O varicella     Past Surgical History  Procedure Date  . Back surgery     Family History  Problem Relation Age of Onset  . Hypertension Mother   . Heart disease Maternal Grandfather     History  Substance Use Topics  . Smoking status: Former Games developer  . Smokeless tobacco: Never Used  . Alcohol Use: No    OB History    Grav Para Term Preterm Abortions TAB SAB Ect Mult Living   3 1 1  2  2   1       Review of Systems  Gastrointestinal: Positive for vomiting.  Genitourinary: Positive for dysuria and flank pain.  All other systems reviewed and are negative.    Allergies  Review of patient's allergies indicates no known allergies.  Home Medications   Current Outpatient Rx  Name Route Sig Dispense Refill  . PRENATAL MULTIVITAMIN CH Oral Take 1 tablet by mouth daily.      BP 133/92  Pulse 84  Temp 98.6 F (37 C) (Oral)  Resp 23  SpO2 100%  LMP 08/21/2011  Breastfeeding? No  Physical Exam  Nursing note and vitals reviewed. Constitutional: She is oriented to person, place, and time. She appears  well-developed and well-nourished.  Non-toxic appearance. No distress.  HENT:  Head: Normocephalic and atraumatic.  Eyes: Conjunctivae, EOM and lids are normal. Pupils are equal, round, and reactive to light.  Neck: Normal range of motion. Neck supple. No tracheal deviation present. No mass present.  Cardiovascular: Normal rate, regular rhythm and normal heart sounds.  Exam reveals no gallop.   No murmur heard. Pulmonary/Chest: Effort normal and breath sounds normal. No stridor. No respiratory distress. She has no decreased breath sounds. She has no wheezes. She has no rhonchi. She has no rales.  Abdominal: Soft. Normal appearance and bowel sounds are normal. She exhibits no distension. There is tenderness in the right upper quadrant and right lower quadrant. There is no rigidity, no rebound, no guarding and no CVA tenderness.  Musculoskeletal: Normal range of motion. She exhibits no edema and no tenderness.  Neurological: She is alert and oriented to person, place, and time. She has normal strength. No cranial nerve deficit or sensory deficit. GCS eye subscore is 4. GCS verbal subscore is 5. GCS motor subscore is 6.  Skin: Skin is warm and dry. No abrasion and no rash noted.  Psychiatric: She has a normal mood and affect. Her speech is normal and behavior is normal.    ED Course  Procedures (including critical care time)   Labs Reviewed  CBC WITH DIFFERENTIAL  COMPREHENSIVE METABOLIC PANEL  LIPASE, BLOOD  URINALYSIS, ROUTINE W REFLEX MICROSCOPIC  URINE CULTURE   No results found.   No diagnosis found.    MDM  CT and ultrasound results noted and patient has cholelithiasis without cholelithiasis. She's been given pain medication here and her pain is controlled and she will be given referral to the general surgeon on call        Toy Baker, MD 06/21/12 1440  Toy Baker, MD 06/21/12 1453

## 2012-06-22 LAB — URINE CULTURE

## 2012-07-02 ENCOUNTER — Encounter (HOSPITAL_COMMUNITY): Payer: Self-pay | Admitting: Emergency Medicine

## 2012-07-02 ENCOUNTER — Emergency Department (HOSPITAL_COMMUNITY): Payer: Medicaid Other | Admitting: Anesthesiology

## 2012-07-02 ENCOUNTER — Encounter (HOSPITAL_COMMUNITY)
Admission: EM | Disposition: A | Discharge status: Home / Self Care | Payer: Self-pay | Source: Home / Self Care | Attending: Emergency Medicine

## 2012-07-02 ENCOUNTER — Encounter (HOSPITAL_COMMUNITY): Payer: Self-pay | Admitting: Anesthesiology

## 2012-07-02 ENCOUNTER — Emergency Department (HOSPITAL_COMMUNITY)
Admission: EM | Admit: 2012-07-02 | Discharge: 2012-07-02 | Disposition: A | Discharge status: Home / Self Care | Payer: Medicaid Other | Attending: Emergency Medicine | Admitting: Emergency Medicine

## 2012-07-02 DIAGNOSIS — K8020 Calculus of gallbladder without cholecystitis without obstruction: Secondary | ICD-10-CM

## 2012-07-02 DIAGNOSIS — R1011 Right upper quadrant pain: Secondary | ICD-10-CM

## 2012-07-02 DIAGNOSIS — K8050 Calculus of bile duct without cholangitis or cholecystitis without obstruction: Secondary | ICD-10-CM

## 2012-07-02 HISTORY — DX: Calculus of gallbladder without cholecystitis without obstruction: K80.20

## 2012-07-02 LAB — CBC WITH DIFFERENTIAL/PLATELET
Basophils Relative: 0 % (ref 0–1)
Eosinophils Absolute: 0.3 10*3/uL (ref 0.0–0.7)
Eosinophils Relative: 3 % (ref 0–5)
HCT: 41.5 % (ref 36.0–46.0)
Hemoglobin: 14.3 g/dL (ref 12.0–15.0)
MCH: 30.7 pg (ref 26.0–34.0)
MCHC: 34.5 g/dL (ref 30.0–36.0)
Monocytes Absolute: 0.5 10*3/uL (ref 0.1–1.0)
Monocytes Relative: 6 % (ref 3–12)

## 2012-07-02 LAB — COMPREHENSIVE METABOLIC PANEL
Albumin: 3.8 g/dL (ref 3.5–5.2)
BUN: 9 mg/dL (ref 6–23)
Calcium: 9.5 mg/dL (ref 8.4–10.5)
Creatinine, Ser: 0.68 mg/dL (ref 0.50–1.10)
Total Bilirubin: 0.4 mg/dL (ref 0.3–1.2)
Total Protein: 7.2 g/dL (ref 6.0–8.3)

## 2012-07-02 LAB — LIPASE, BLOOD: Lipase: 18 U/L (ref 11–59)

## 2012-07-02 LAB — URINALYSIS, ROUTINE W REFLEX MICROSCOPIC
Ketones, ur: NEGATIVE mg/dL
Nitrite: NEGATIVE
Specific Gravity, Urine: 1.015 (ref 1.005–1.030)
pH: 7.5 (ref 5.0–8.0)

## 2012-07-02 LAB — URINE MICROSCOPIC-ADD ON

## 2012-07-02 SURGERY — LAPAROSCOPIC CHOLECYSTECTOMY WITH INTRAOPERATIVE CHOLANGIOGRAM
Anesthesia: General

## 2012-07-02 MED ORDER — FENTANYL CITRATE 0.05 MG/ML IJ SOLN
50.0000 ug | Freq: Once | INTRAMUSCULAR | Status: AC
  Administered 2012-07-02: 50 ug via INTRAVENOUS

## 2012-07-02 MED ORDER — SODIUM CHLORIDE 0.9 % IV BOLUS (SEPSIS)
500.0000 mL | Freq: Once | INTRAVENOUS | Status: AC
  Administered 2012-07-02: 1000 mL via INTRAVENOUS

## 2012-07-02 MED ORDER — HYDROMORPHONE HCL PF 1 MG/ML IJ SOLN: 0.5000 mg | INTRAMUSCULAR | Status: DC | PRN

## 2012-07-02 MED ORDER — SODIUM CHLORIDE 0.9 % IV SOLN
INTRAVENOUS | Status: DC
  Administered 2012-07-02: 15:00:00 via INTRAVENOUS

## 2012-07-02 MED ORDER — FENTANYL CITRATE 0.05 MG/ML IJ SOLN
50.0000 ug | Freq: Once | INTRAMUSCULAR | Status: AC
  Administered 2012-07-02: 50 ug via INTRAVENOUS
  Filled 2012-07-02: qty 2

## 2012-07-02 MED ORDER — OXYCODONE-ACETAMINOPHEN 5-325 MG PO TABS: 2.0000 | ORAL_TABLET | ORAL | Status: AC | PRN

## 2012-07-02 MED ORDER — ONDANSETRON HCL 4 MG/2ML IJ SOLN
4.0000 mg | Freq: Once | INTRAMUSCULAR | Status: AC
  Administered 2012-07-02: 4 mg via INTRAVENOUS
  Filled 2012-07-02: qty 2

## 2012-07-02 MED ORDER — ONDANSETRON HCL 4 MG/2ML IJ SOLN: 4.0000 mg | Freq: Four times a day (QID) | INTRAMUSCULAR | Status: DC | PRN

## 2012-07-02 NOTE — ED Provider Notes (Signed)
History     CSN: [id number]  Arrival date & time 07/02/12  1151   First MD Initiated Contact with Patient 07/02/12 1242      Chief Complaint  Patient presents with  . Abdominal Pain    (Consider location/radiation/quality/duration/timing/severity/associated sxs/prior treatment) Patient is a 31 y.o. female presenting with abdominal pain. The history is provided by the patient.  Abdominal Pain The primary symptoms of the illness include abdominal pain and nausea. The primary symptoms of the illness do not include shortness of breath, vomiting or diarrhea.  Symptoms associated with the illness do not include back pain.   patient's had abdominal pain for last couple weeks. She was seen in ER on July 9 and diagnosed with gallstones after an ultrasound and CT. She's had increasing frequency and intensity the pains. It initially was with eating, nausea and state whether it is associated with it anymore. No fevers. She's had some nausea without vomiting. No diarrhea.  Past Medical History  Diagnosis Date  . H/O varicella   . Gallstones     Past Surgical History  Procedure Date  . Back surgery     Family History  Problem Relation Age of Onset  . Hypertension Mother   . Heart disease Maternal Grandfather     History  Substance Use Topics  . Smoking status: Former Games developer  . Smokeless tobacco: Never Used  . Alcohol Use: No    OB History    Grav Para Term Preterm Abortions TAB SAB Ect Mult Living   3 1 1  2  2   1       Review of Systems  Constitutional: Positive for appetite change. Negative for activity change.  HENT: Negative for neck stiffness.   Eyes: Negative for pain.  Respiratory: Negative for chest tightness and shortness of breath.   Cardiovascular: Negative for chest pain and leg swelling.  Gastrointestinal: Positive for nausea and abdominal pain. Negative for vomiting and diarrhea.  Genitourinary: Negative for flank pain.  Musculoskeletal: Negative for back  pain.  Skin: Negative for rash.  Neurological: Negative for weakness, numbness and headaches.  Psychiatric/Behavioral: Negative for behavioral problems.    Allergies  Review of patient's allergies indicates no known allergies.  Home Medications   Current Outpatient Rx  Name Route Sig Dispense Refill  . ONDANSETRON HCL 8 MG PO TABS Oral Take 8 mg by mouth every 8 (eight) hours as needed. nausea    . PRENATAL MULTIVITAMIN CH Oral Take 1 tablet by mouth daily.      BP 102/60  Pulse 78  Temp 98.7 F (37.1 C) (Oral)  Resp 16  Ht 5\' 6"  (1.676 m)  Wt 195 lb (88.451 kg)  BMI 31.47 kg/m2  SpO2 98%  Breastfeeding? Unknown  Physical Exam  Nursing note and vitals reviewed. Constitutional: She is oriented to person, place, and time. She appears well-developed and well-nourished.       Patient appears uncomfortable  HENT:  Head: Normocephalic and atraumatic.  Eyes: EOM are normal. Pupils are equal, round, and reactive to light.  Neck: Normal range of motion. Neck supple.  Cardiovascular: Regular rhythm and normal heart sounds.   No murmur heard.      Mild tachycardia  Pulmonary/Chest: Effort normal and breath sounds normal. No respiratory distress. She has no wheezes. She has no rales.  Abdominal: Soft. Bowel sounds are normal. She exhibits no distension. There is tenderness. There is no rebound and no guarding.       Upper abdominal tenderness,  worse in the right side without guarding or rebound.  Musculoskeletal: Normal range of motion.  Neurological: She is alert and oriented to person, place, and time. No cranial nerve deficit.  Skin: Skin is warm and dry.  Psychiatric: She has a normal mood and affect. Her speech is normal.    ED Course  Procedures (including critical care time)  Labs Reviewed  CBC WITH DIFFERENTIAL - Abnormal; Notable for the following:    Platelets 468 (*)     All other components within normal limits  URINALYSIS, ROUTINE W REFLEX MICROSCOPIC -  Abnormal; Notable for the following:    APPearance CLOUDY (*)     Hgb urine dipstick LARGE (*)     Leukocytes, UA LARGE (*)     All other components within normal limits  COMPREHENSIVE METABOLIC PANEL  LIPASE, BLOOD  PREGNANCY, URINE  URINE MICROSCOPIC-ADD ON   No results found.   1. Biliary colic       MDM  Patient with right upper abdominal pain. Recently diagnosed with gallstones. Continued and increasing pain. Laboratory reassuring. Patient was seen in the ER by DrByerly and will be admitted.        Juliet Rude. Rubin Payor, MD 07/02/12 1555

## 2012-07-02 NOTE — Anesthesia Preprocedure Evaluation (Deleted)
Anesthesia Evaluation  Patient identified by MRN, date of birth, ID band Patient awake    Reviewed: Allergy & Precautions, H&P , Patient's Chart, lab work & pertinent test results  Airway Mallampati: II TM Distance: >3 FB Neck ROM: full    Dental No notable dental hx. (+) Teeth Intact and Dental Advisory Given   Pulmonary neg pulmonary ROS,  breath sounds clear to auscultation  Pulmonary exam normal       Cardiovascular negative cardio ROS  Rhythm:regular Rate:Normal     Neuro/Psych negative neurological ROS  negative psych ROS   GI/Hepatic negative GI ROS, Neg liver ROS,   Endo/Other  negative endocrine ROS  Renal/GU negative Renal ROS     Musculoskeletal   Abdominal   Peds  Hematology negative hematology ROS (+)   Anesthesia Other Findings   Reproductive/Obstetrics                           Anesthesia Physical Anesthesia Plan  ASA: I and Emergent  Anesthesia Plan:    Post-op Pain Management:    Induction: Intravenous  Airway Management Planned: Oral ETT  Additional Equipment:   Intra-op Plan:   Post-operative Plan: Extubation in OR  Informed Consent: I have reviewed the patients History and Physical, chart, labs and discussed the procedure including the risks, benefits and alternatives for the proposed anesthesia with the patient or authorized representative who has indicated his/her understanding and acceptance.   Dental advisory given  Plan Discussed with: CRNA  Anesthesia Plan Comments:         Anesthesia Quick Evaluation

## 2012-07-02 NOTE — H&P (Signed)
Crystal Acosta is an 31 y.o. female.   Chief Complaint: Gallstones HPI:  Pt is a 31 year old female who presents with increasing frequency of pain attacks in RUQ.  She is 6 weeks post partum.  She was seen in ED July 9, and was diagnosed with gallstones.  She had severe episode of pain at that time.  She now is at the point where she has a constant aching, nagging pain in the RUQ.  She has severe pains that have been occuring daily, even when not eating anything.  She has nausea and vomiting.  She denies fevers/ chills.  She has an appointment at [facility], but it is not for several weeks.  The pain is worse than labor.    Past Medical History  Diagnosis Date  . H/O varicella   . Gallstones     Past Surgical History  Procedure Date  . Back surgery     Family History  Problem Relation Age of Onset  . Hypertension Mother   . Heart disease Maternal Grandfather    Social History:  reports that she has quit smoking. She has never used smokeless tobacco. She reports that she does not drink alcohol or use illicit drugs.  Allergies: No Known Allergies  MedicationsLong-Term  Prescriptions Show Facility-Administered Medications   ondansetron (ZOFRAN) 8 MG tablet  Prenatal Vit-Fe Fumarate-FA (PRENATAL MULTIVITAMIN) TABS     Results for orders placed during the hospital encounter of 07/02/12 (from the past 48 hour(s))  URINALYSIS, ROUTINE W REFLEX MICROSCOPIC     Status: Abnormal   Collection Time   07/02/12  1:00 PM      Component Value Range Comment   Color, Urine YELLOW  YELLOW    APPearance CLOUDY (*) CLEAR    Specific Gravity, Urine 1.015  1.005 - 1.030    pH 7.5  5.0 - 8.0    Glucose, UA NEGATIVE  NEGATIVE mg/dL    Hgb urine dipstick LARGE (*) NEGATIVE    Bilirubin Urine NEGATIVE  NEGATIVE    Ketones, ur NEGATIVE  NEGATIVE mg/dL    Protein, ur NEGATIVE  NEGATIVE mg/dL    Urobilinogen, UA 1.0  0.0 - 1.0 mg/dL    Nitrite NEGATIVE  NEGATIVE    Leukocytes, UA LARGE (*) NEGATIVE     PREGNANCY, URINE     Status: Normal   Collection Time   07/02/12  1:00 PM      Component Value Range Comment   Preg Test, Ur NEGATIVE  NEGATIVE   URINE MICROSCOPIC-ADD ON     Status: Normal   Collection Time   07/02/12  1:00 PM      Component Value Range Comment   Squamous Epithelial / LPF RARE  RARE    WBC, UA 11-20  <3 WBC/hpf    RBC / HPF 0-2  <3 RBC/hpf    Bacteria, UA RARE  RARE   CBC WITH DIFFERENTIAL     Status: Abnormal   Collection Time   07/02/12  1:30 PM      Component Value Range Comment   WBC 8.5  4.0 - 10.5 K/uL    RBC 4.66  3.87 - 5.11 MIL/uL    Hemoglobin 14.3  12.0 - 15.0 g/dL    HCT 91.4  78.2 - 95.6 %    MCV 89.1  78.0 - 100.0 fL    MCH 30.7  26.0 - 34.0 pg    MCHC 34.5  30.0 - 36.0 g/dL    RDW 13.4  11.5 - 15.5 %    Platelets 468 (*) 150 - 400 K/uL    Neutrophils Relative 63  43 - 77 %    Neutro Abs 5.3  1.7 - 7.7 K/uL    Lymphocytes Relative 28  12 - 46 %    Lymphs Abs 2.4  0.7 - 4.0 K/uL    Monocytes Relative 6  3 - 12 %    Monocytes Absolute 0.5  0.1 - 1.0 K/uL    Eosinophils Relative 3  0 - 5 %    Eosinophils Absolute 0.3  0.0 - 0.7 K/uL    Basophils Relative 0  0 - 1 %    Basophils Absolute 0.0  0.0 - 0.1 K/uL   COMPREHENSIVE METABOLIC PANEL     Status: Normal   Collection Time   07/02/12  1:30 PM      Component Value Range Comment   Sodium 135  135 - 145 mEq/L    Potassium 3.8  3.5 - 5.1 mEq/L    Chloride 100  96 - 112 mEq/L    CO2 24  19 - 32 mEq/L    Glucose, Bld 99  70 - 99 mg/dL    BUN 9  6 - 23 mg/dL    Creatinine, Ser 4.09  0.50 - 1.10 mg/dL    Calcium 9.5  8.4 - 81.1 mg/dL    Total Protein 7.2  6.0 - 8.3 g/dL    Albumin 3.8  3.5 - 5.2 g/dL    AST 19  0 - 37 U/L    ALT 17  0 - 35 U/L    Alkaline Phosphatase 75  39 - 117 U/L    Total Bilirubin 0.4  0.3 - 1.2 mg/dL    GFR calc non Af Amer >90  >90 mL/min    GFR calc Af Amer >90  >90 mL/min   LIPASE, BLOOD     Status: Normal   Collection Time   07/02/12  1:30 PM      Component Value  Range Comment   Lipase 18  11 - 59 U/L    No results found.  Review of Systems  Constitutional: Negative.   HENT: Negative.   Eyes: Negative.   Respiratory: Negative.   Cardiovascular: Negative.   Gastrointestinal: Positive for nausea, vomiting and abdominal pain. Negative for diarrhea and constipation.  Genitourinary: Positive for flank pain.  Musculoskeletal: Negative.   Skin: Negative.   Neurological: Negative.   Endo/Heme/Allergies: Negative.   Psychiatric/Behavioral: Negative.     Blood pressure 102/60, pulse 78, temperature 98.7 F (37.1 C), temperature source Oral, resp. rate 16, height 5\' 6"  (1.676 m), weight 195 lb (88.451 kg), SpO2 98.00%, unknown if currently breastfeeding. Physical Exam  Constitutional: She is oriented to person, place, and time. She appears well-developed and well-nourished. She appears distressed.  HENT:  Head: Normocephalic and atraumatic.  Mouth/Throat: No oropharyngeal exudate.  Eyes: Conjunctivae are normal. Pupils are equal, round, and reactive to light. No scleral icterus.  Neck: Normal range of motion. Neck supple. No tracheal deviation present. No thyromegaly present.  Cardiovascular: Normal rate, regular rhythm, normal heart sounds and intact distal pulses.  Exam reveals no gallop and no friction rub.   No murmur heard. Respiratory: Effort normal and breath sounds normal. No respiratory distress. She has no wheezes. She has no rales. She exhibits no tenderness.  GI: Soft. Bowel sounds are normal. She exhibits no distension and no mass. There is tenderness (ruq). There is no rebound and  no guarding.  Musculoskeletal: Normal range of motion. She exhibits no edema and no tenderness.  Lymphadenopathy:    She has no cervical adenopathy.  Neurological: She is alert and oriented to person, place, and time. Coordination normal.  Skin: Skin is warm and dry. No rash noted. She is not diaphoretic. No erythema. No pallor.  Psychiatric: She has a  normal mood and affect. Her behavior is normal. Judgment and thought content normal.     Assessment/Plan Cholecystitis- IVF, IV antibiotics To OR for lap chole with cholangiogram.   The surgical procedure was described to the patient in detail.  The patient was given educational material.  I discussed the incision type and location, the location of the gallbladder, the anatomy of the bile ducts and arteries, and the typical progression of surgery.  I discussed the possibility of converting to an open operation.  I advised of the risks of bleeding, infection, damage to other structures (such as the bile duct, intestine or liver), bile leak, need for other procedures or surgeries, and post op diarrhea/constipation.      Karolyn Messing 07/02/2012, 3:42 PM

## 2012-07-02 NOTE — ED Notes (Signed)
Spoke with Dr. Donell Beers regarding admission/pain management

## 2012-07-02 NOTE — ED Provider Notes (Signed)
8 PM patient resting comfortably alert no distress. Per discussion with Dr. Rubin Payor plan patient to have prescription for Percocet. Patient to followup with [facility] [place] [facility] on 07/04/2012 for followup and to arrange cholecystectomy. Return for fever if feels worse  Doug Sou, MD 07/02/12 2001

## 2012-07-02 NOTE — ED Notes (Signed)
RN to obtain labs with start of IV 

## 2012-07-02 NOTE — ED Notes (Signed)
Continues to c/o right upper abd pain. Radiates into back, between shoulder blades.

## 2012-07-02 NOTE — ED Notes (Signed)
Pt presents w/ RUQ pain onset 2.5 hrs w/ emesis x 2. Pt Dx w/ gallstones July 9, appt w/ surgeon is not until Aug 6. Pt states she continues to have attacks of pain w/ emesis and is unable to get into see the surgeon any sooner.

## 2012-07-04 ENCOUNTER — Other Ambulatory Visit (INDEPENDENT_AMBULATORY_CARE_PROVIDER_SITE_OTHER): Payer: Self-pay | Admitting: General Surgery

## 2012-07-04 ENCOUNTER — Telehealth (INDEPENDENT_AMBULATORY_CARE_PROVIDER_SITE_OTHER): Payer: Self-pay | Admitting: General Surgery

## 2012-07-04 NOTE — Telephone Encounter (Signed)
Pt called to request pain meds to bridge to GB surgery.  Stated she was seen in ER over weekend by Dr. Donell Beers.  Please advise.

## 2012-07-06 ENCOUNTER — Ambulatory Visit: Admit: 2012-07-06 | Payer: Self-pay | Admitting: General Surgery

## 2012-07-06 SURGERY — LAPAROSCOPIC CHOLECYSTECTOMY WITH INTRAOPERATIVE CHOLANGIOGRAM
Anesthesia: General

## 2012-07-07 ENCOUNTER — Ambulatory Visit (INDEPENDENT_AMBULATORY_CARE_PROVIDER_SITE_OTHER): Payer: Medicaid Other | Admitting: General Surgery

## 2012-07-08 ENCOUNTER — Encounter (HOSPITAL_COMMUNITY): Payer: Self-pay | Admitting: Pharmacy Technician

## 2012-07-08 ENCOUNTER — Inpatient Hospital Stay (HOSPITAL_COMMUNITY): Admission: RE | Admit: 2012-07-08 | Payer: Medicaid Other | Source: Ambulatory Visit

## 2012-07-08 ENCOUNTER — Encounter (HOSPITAL_COMMUNITY): Payer: Self-pay | Admitting: *Deleted

## 2012-07-08 NOTE — Pre-Procedure Instructions (Signed)
PT CALLED-STATES SHE IS UNABLE TO GET TRANSPORTATION TO [facility] FOR PREOP VISIT TODAY - STATES SHE DOES NOT HAVE A CAR.  SAME DAY SURGERY INSTRUCTIONS DISCUSSED WITH PT BY PHONE--PT TOLD SHE MUST ARRIVE TO SHORT STAY BY 5:30 AM Monday 7/29--THAT SHE SHOULD PLAN TO BE IN THE PARKING AREA BETWEEN 0500 AND 0515--BECAUSE IT TAKES A WHILE TO PARK AND WALK UP TO HOSPITAL ENTRANCE AND THEN FIND SHORT STAY.  PT VOICED UNDERSTANDING. PT INSTRUCTED TO TRY TO BUY BETASEPT SOAP AT THE DRUG STORE TO SHOWER WITH THE NIGHT BEFORE HER SURGERY AND THE AM OF HER SURGERY.  PT INSTRUCTED NOT TO USE ON HER FACE OR PRIVATE AREAS AND NOT TO SHAVE SKIN 48 HOURS BEFORE USING-TO AVOID SKIN IRRITATION.

## 2012-07-10 ENCOUNTER — Inpatient Hospital Stay (HOSPITAL_COMMUNITY)
Admission: EM | Admit: 2012-07-10 | Discharge: 2012-07-13 | DRG: 419 | Disposition: A | Discharge status: Home / Self Care | Payer: Medicaid Other | Attending: General Surgery | Admitting: General Surgery

## 2012-07-10 ENCOUNTER — Encounter (HOSPITAL_COMMUNITY): Payer: Self-pay | Admitting: *Deleted

## 2012-07-10 DIAGNOSIS — K8020 Calculus of gallbladder without cholecystitis without obstruction: Secondary | ICD-10-CM

## 2012-07-10 DIAGNOSIS — R1011 Right upper quadrant pain: Secondary | ICD-10-CM

## 2012-07-10 DIAGNOSIS — K838 Other specified diseases of biliary tract: Secondary | ICD-10-CM | POA: Diagnosis present

## 2012-07-10 DIAGNOSIS — F17200 Nicotine dependence, unspecified, uncomplicated: Secondary | ICD-10-CM | POA: Diagnosis present

## 2012-07-10 DIAGNOSIS — K8001 Calculus of gallbladder with acute cholecystitis with obstruction: Principal | ICD-10-CM | POA: Diagnosis present

## 2012-07-10 DIAGNOSIS — K8010 Calculus of gallbladder with chronic cholecystitis without obstruction: Secondary | ICD-10-CM

## 2012-07-10 HISTORY — DX: Calculus of gallbladder with chronic cholecystitis without obstruction: K80.10

## 2012-07-10 LAB — URINALYSIS, ROUTINE W REFLEX MICROSCOPIC
Glucose, UA: NEGATIVE mg/dL
Ketones, ur: NEGATIVE mg/dL
Protein, ur: NEGATIVE mg/dL
pH: 8 (ref 5.0–8.0)

## 2012-07-10 LAB — CBC WITH DIFFERENTIAL/PLATELET
Basophils Absolute: 0 10*3/uL (ref 0.0–0.1)
Lymphocytes Relative: 24 % (ref 12–46)
Neutro Abs: 5.7 10*3/uL (ref 1.7–7.7)
Neutrophils Relative %: 68 % (ref 43–77)
Platelets: 389 10*3/uL (ref 150–400)
RDW: 13.3 % (ref 11.5–15.5)
WBC: 8.3 10*3/uL (ref 4.0–10.5)

## 2012-07-10 LAB — COMPREHENSIVE METABOLIC PANEL
ALT: 12 U/L (ref 0–35)
AST: 21 U/L (ref 0–37)
Albumin: 4.1 g/dL (ref 3.5–5.2)
CO2: 19 mEq/L (ref 19–32)
Chloride: 102 mEq/L (ref 96–112)
GFR calc non Af Amer: >90 mL/min (ref >90)
Sodium: 137 mEq/L (ref 135–145)
Total Bilirubin: 0.4 mg/dL (ref 0.3–1.2)

## 2012-07-10 LAB — SURGICAL PCR SCREEN
MRSA, PCR: NEGATIVE
Staphylococcus aureus: NEGATIVE

## 2012-07-10 LAB — URINE MICROSCOPIC-ADD ON

## 2012-07-10 MED ORDER — HYDROMORPHONE HCL PF 1 MG/ML IJ SOLN
1.0000 mg | Freq: Once | INTRAMUSCULAR | Status: AC
  Administered 2012-07-10: 1 mg via INTRAVENOUS

## 2012-07-10 MED ORDER — HYDROMORPHONE 0.3 MG/ML IV SOLN
INTRAVENOUS | Status: DC
  Administered 2012-07-10 – 2012-07-11 (×3): via INTRAVENOUS
  Administered 2012-07-11: 2.7 mg via INTRAVENOUS
  Administered 2012-07-11: 4.8 mg via INTRAVENOUS
  Administered 2012-07-11: 4.3 mg via INTRAVENOUS
  Administered 2012-07-11: 3.83 mg via INTRAVENOUS
  Administered 2012-07-11: 0.3 mg via INTRAVENOUS
  Administered 2012-07-12: 14:00:00 via INTRAVENOUS
  Administered 2012-07-12: 2.4 mg via INTRAVENOUS
  Administered 2012-07-12: 20:00:00 via INTRAVENOUS
  Administered 2012-07-13: 7.77 mg via INTRAVENOUS
  Administered 2012-07-13: 04:00:00 via INTRAVENOUS
  Administered 2012-07-13: 4.2 mg via INTRAVENOUS
  Administered 2012-07-13: 1.8 mg via INTRAVENOUS
  Filled 2012-07-10 (×8): qty 25

## 2012-07-10 MED ORDER — CEFAZOLIN SODIUM-DEXTROSE 2-3 GM-% IV SOLR
2.0000 g | Freq: Three times a day (TID) | INTRAVENOUS | Status: DC
  Administered 2012-07-10 – 2012-07-11 (×2): 2 g via INTRAVENOUS
  Filled 2012-07-10 (×3): qty 50

## 2012-07-10 MED ORDER — SODIUM CHLORIDE 0.9 % IV SOLN
INTRAVENOUS | Status: DC
  Administered 2012-07-10: 1000 mL via INTRAVENOUS

## 2012-07-10 MED ORDER — ONDANSETRON HCL 4 MG/2ML IJ SOLN
4.0000 mg | Freq: Once | INTRAMUSCULAR | Status: AC
Start: 2012-07-10 — End: 2012-07-10
  Administered 2012-07-10: 4 mg via INTRAVENOUS
  Filled 2012-07-10: qty 2

## 2012-07-10 MED ORDER — HYDROMORPHONE HCL PF 1 MG/ML IJ SOLN
INTRAMUSCULAR | Status: AC
  Filled 2012-07-10: qty 1

## 2012-07-10 MED ORDER — ONDANSETRON HCL 4 MG/2ML IJ SOLN: 4.0000 mg | Freq: Four times a day (QID) | INTRAMUSCULAR | Status: DC | PRN

## 2012-07-10 MED ORDER — NALOXONE HCL 0.4 MG/ML IJ SOLN: 0.4000 mg | INTRAMUSCULAR | Status: DC | PRN

## 2012-07-10 MED ORDER — DIPHENHYDRAMINE HCL 50 MG/ML IJ SOLN: 12.5000 mg | Freq: Four times a day (QID) | INTRAMUSCULAR | Status: DC | PRN

## 2012-07-10 MED ORDER — ZOLPIDEM TARTRATE 10 MG PO TABS
10.0000 mg | ORAL_TABLET | Freq: Once | ORAL | Status: AC
  Administered 2012-07-10: 10 mg via ORAL
  Filled 2012-07-10: qty 1

## 2012-07-10 MED ORDER — POTASSIUM CHLORIDE IN NACL 20-0.9 MEQ/L-% IV SOLN
INTRAVENOUS | Status: DC
  Administered 2012-07-10 – 2012-07-13 (×5): via INTRAVENOUS
  Filled 2012-07-10 (×8): qty 1000

## 2012-07-10 MED ORDER — DIPHENHYDRAMINE HCL 12.5 MG/5ML PO ELIX: 12.5000 mg | ORAL_SOLUTION | Freq: Four times a day (QID) | ORAL | Status: DC | PRN

## 2012-07-10 MED ORDER — SODIUM CHLORIDE 0.9 % IJ SOLN: 9.0000 mL | INTRAMUSCULAR | Status: DC | PRN

## 2012-07-10 MED ORDER — HYDROMORPHONE HCL PF 1 MG/ML IJ SOLN
1.0000 mg | Freq: Once | INTRAMUSCULAR | Status: AC
  Administered 2012-07-10: 1 mg via INTRAVENOUS
  Filled 2012-07-10: qty 1

## 2012-07-10 NOTE — Anesthesia Preprocedure Evaluation (Addendum)

## 2012-07-10 NOTE — ED Notes (Signed)
Pt with right sided abdominal pain which started this AM.  Pt. Was due for cholecystectomy tomorrow.  Pt reports vomiting x1.

## 2012-07-10 NOTE — H&P (Signed)
NAMEANJELIQUE MAKAR DOB: [birth year] MRN: [id number]                                                                                      DATE: 07/10/2012 IMPRESSION:  Gallstones, chronic calculus cholecystitis with acute biliary colic; maybe developing acute cholecystitis  PLAN:   She is already scheduled for laparoscopic cholecystectomy tomorrow. We will go ahead and admit her this afternoon, start her on IV fluids and antibiotics, and get her better pain control for the evening.                 CC:  Chief Complaint  Patient presents with  . Abdominal Pain    HPI:  Crystal Acosta is a 31 y.o.  female who presents for evaluation of Right upper quadrant abdominal pain. She is about 6 weeks postpartum. She developed biliary-type symptoms a few weeks ago and was seen in the emergency department, scheduled for surgery and then rescheduled for tomorrow. She has developed another episode of biliary colic which started early this morning and has not resolved despite a few doses of Dilaudid within the emergency department. She has some nausea that developed at the onset of her pain but is not nauseated now. She has not had any diarrhea  PMH:  has a past medical history of H/O varicella and Gallstones.  PSH:   has past surgical history that includes Back surgery.  ALLERGIES:  No Known Allergies  MEDICATIONS: Current facility-administered medications:0.9 %  sodium chloride infusion, , Intravenous, Continuous, Cheri Guppy, MD, Last Rate: 125 mL/hr at 07/10/12 1319, 1,000 mL at 07/10/12 1319;  HYDROmorphone (DILAUDID) injection 1 mg, 1 mg, Intravenous, Once, Flint Melter, MD, 1 mg at 07/10/12 1328;  HYDROmorphone (DILAUDID) injection 1 mg, 1 mg, Intravenous, Once, Flint Melter, MD, 1 mg at 07/10/12 1422 ondansetron (ZOFRAN) injection 4 mg, 4 mg, Intravenous, Once, Flint Melter, MD, 4 mg at 07/10/12 1328 Current outpatient prescriptions:ibuprofen (ADVIL,MOTRIN) 600 MG tablet, Take 600  mg by mouth every 6 (six) hours as needed., Disp: , Rfl: ;  ondansetron (ZOFRAN) 8 MG tablet, Take 8 mg by mouth every 8 (eight) hours as needed. nausea, Disp: , Rfl: ;  oxyCODONE-acetaminophen (PERCOCET/ROXICET) 5-325 MG per tablet, Take 2 tablets by mouth every 4 (four) hours as needed for pain., Disp: 16 tablet, Rfl: 0 Prenatal Vit-Fe Fumarate-FA (PRENATAL MULTIVITAMIN) TABS, Take 1 tablet by mouth daily., Disp: , Rfl:   ROS: She Has a negative review of systems except for pertinent things in the history of present illness EXAM:   VITAL SIGNS: BP 109/63  Pulse 64  Temp 98 F (36.7 C) (Oral)  Resp 20  Wt 195 lb (88.451 kg)  SpO2 96%  LMP 07/09/2012 GENERAL:  The patient is alert, oriented, and generally healthy-appearing, NAD. Mood and affect are normal.  HEENT:  The head is normocephalic, the eyes nonicteric, the pupils were round regular and equal. EOMs are normal. Pharynx normal. Dentition good.  NECK:  The neck is supple and there are no masses or thyromegaly.  LUNGS: Normal respirations and clear to auscultation.  HEART: Regular rhythm, with no murmurs rubs or  gallops. Pulses are intact carotid dorsalis pedis and posterior tibial. No significant varicosities are noted.   ABDOMEN: She is here tender in the right upper quadrant with mild guarding. Rest of her abdomen is soft. There is no rebound. Bowel sounds are present.  EXTREMITIES:  Good range of motion, no edema.   DATA REVIEWED:  Her ultrasound previously showed gallstones without evidence of acute cholecystitis. Today her white count is normal as are her liver functions.    Daivon Rayos J 07/10/2012  CC: No ref. provider found, No primary provider on file.

## 2012-07-10 NOTE — ED Provider Notes (Signed)
History     CSN: [id number]  Arrival date & time 07/10/12  1243   First MD Initiated Contact with Patient 07/10/12 1318      Chief Complaint  Patient presents with  . Abdominal Pain    (Consider location/radiation/quality/duration/timing/severity/associated sxs/prior treatment) HPI Comments: Crystal Acosta is a 31 y.o. Female who has had right upper quadrant pain for several days, worsening. She's also had intermittent pain for about 3 weeks. She was diagnosed with cholecystitis and is due for me tomorrow morning. She's had nausea without vomiting. She denies fever, chills, cough, shortness of breath, or chest pain. She did not take narcotics at home today.  Patient is a 31 y.o. female presenting with abdominal pain. The history is provided by the patient.  Abdominal Pain The primary symptoms of the illness include abdominal pain.    Past Medical History  Diagnosis Date  . H/O varicella   . Gallstones     Past Surgical History  Procedure Date  . Back surgery     Family History  Problem Relation Age of Onset  . Hypertension Mother   . Heart disease Maternal Grandfather     History  Substance Use Topics  . Smoking status: Current Everyday Smoker -- 0.5 packs/day for 12 years    Types: Cigarettes  . Smokeless tobacco: Never Used  . Alcohol Use: No    OB History    Grav Para Term Preterm Abortions TAB SAB Ect Mult Living   3 1 1  2  2   1       Review of Systems  Gastrointestinal: Positive for abdominal pain.  All other systems reviewed and are negative.    Allergies  Review of patient's allergies indicates no known allergies.  Home Medications   Current Outpatient Rx  Name Route Sig Dispense Refill  . IBUPROFEN 600 MG PO TABS Oral Take 600 mg by mouth every 6 (six) hours as needed.    Marland Kitchen ONDANSETRON HCL 8 MG PO TABS Oral Take 8 mg by mouth every 8 (eight) hours as needed. nausea    . OXYCODONE-ACETAMINOPHEN 5-325 MG PO TABS Oral Take 2 tablets by mouth  every 4 (four) hours as needed for pain. 16 tablet 0  . PRENATAL MULTIVITAMIN CH Oral Take 1 tablet by mouth daily.      BP 109/63  Pulse 64  Temp 98 F (36.7 C) (Oral)  Resp 20  Wt 195 lb (88.451 kg)  SpO2 96%  LMP 07/09/2012  Physical Exam  Nursing note and vitals reviewed. Constitutional: She is oriented to person, place, and time. She appears well-developed and well-nourished.  HENT:  Head: Normocephalic and atraumatic.  Eyes: Conjunctivae and EOM are normal. Pupils are equal, round, and reactive to light.  Neck: Normal range of motion and phonation normal. Neck supple.  Cardiovascular: Normal rate, regular rhythm and intact distal pulses.   Pulmonary/Chest: Effort normal and breath sounds normal. She exhibits no tenderness.  Abdominal: Soft. She exhibits no distension. There is tenderness (moderate tenderness to deep palpation right upper quadrant). There is no rebound and no guarding.  Musculoskeletal: Normal range of motion.  Neurological: She is alert and oriented to person, place, and time. She has normal strength. She exhibits normal muscle tone.  Skin: Skin is warm and dry.  Psychiatric: She has a normal mood and affect. Her behavior is normal. Judgment and thought content normal.    ED Course  Procedures (including critical care time) Emergency department treatment: IV fluids. IV,  Dilaudid, x3.  Case discussed with on-call general surgeon. He will admit the patient.   Labs Reviewed  COMPREHENSIVE METABOLIC PANEL - Abnormal; Notable for the following:    Glucose, Bld 100 (*)     BUN 5 (*)     All other components within normal limits  URINALYSIS, ROUTINE W REFLEX MICROSCOPIC - Abnormal; Notable for the following:    APPearance CLOUDY (*)     Hgb urine dipstick SMALL (*)     Leukocytes, UA SMALL (*)     All other components within normal limits  CBC WITH DIFFERENTIAL  LIPASE, BLOOD  URINE MICROSCOPIC-ADD ON   No results found.   1. Cholelithiasis        MDM  Cholelithiasis with persistent pain. Patient admitted for management.        Flint Melter, MD 07/10/12 [phone]

## 2012-07-10 NOTE — ED Notes (Signed)
Pt states "was scheduled to have gallbladder removed tomorrow, was told if I had to come back before she may go ahead and do the surgery"; Dr. Is Donell Beers

## 2012-07-11 ENCOUNTER — Inpatient Hospital Stay (HOSPITAL_COMMUNITY): Payer: Medicaid Other

## 2012-07-11 ENCOUNTER — Ambulatory Visit (HOSPITAL_COMMUNITY): Admission: RE | Admit: 2012-07-11 | Payer: Medicaid Other | Source: Ambulatory Visit | Admitting: General Surgery

## 2012-07-11 ENCOUNTER — Inpatient Hospital Stay (HOSPITAL_COMMUNITY): Payer: Medicaid Other | Admitting: Anesthesiology

## 2012-07-11 ENCOUNTER — Encounter (HOSPITAL_COMMUNITY): Payer: Self-pay | Admitting: Anesthesiology

## 2012-07-11 ENCOUNTER — Encounter (HOSPITAL_COMMUNITY): Admission: EM | Disposition: A | Discharge status: Home / Self Care | Payer: Self-pay | Source: Home / Self Care

## 2012-07-11 HISTORY — PX: CHOLECYSTECTOMY: SHX55

## 2012-07-11 LAB — COMPREHENSIVE METABOLIC PANEL
ALT: 10 U/L (ref 0–35)
AST: 11 U/L (ref 0–37)
Albumin: 2.9 g/dL — ABNORMAL LOW (ref 3.5–5.2)
Alkaline Phosphatase: 52 U/L (ref 39–117)
CO2: 25 mEq/L (ref 19–32)
Chloride: 107 mEq/L (ref 96–112)
Creatinine, Ser: 0.75 mg/dL (ref 0.50–1.10)
GFR calc non Af Amer: >90 mL/min (ref >90)
Potassium: 3.8 mEq/L (ref 3.5–5.1)
Sodium: 140 mEq/L (ref 135–145)
Total Bilirubin: 0.1 mg/dL — ABNORMAL LOW (ref 0.3–1.2)

## 2012-07-11 LAB — LIPASE, BLOOD: Lipase: 18 U/L (ref 11–59)

## 2012-07-11 LAB — HEPATIC FUNCTION PANEL
ALT: 10 U/L (ref 0–35)
AST: 12 U/L (ref 0–37)
Albumin: 2.9 g/dL — ABNORMAL LOW (ref 3.5–5.2)

## 2012-07-11 LAB — CBC
Hemoglobin: 12.7 g/dL (ref 12.0–15.0)
MCH: 30.3 pg (ref 26.0–34.0)
Platelets: 351 10*3/uL (ref 150–400)
RBC: 4.19 MIL/uL (ref 3.87–5.11)
WBC: 10.3 10*3/uL (ref 4.0–10.5)

## 2012-07-11 SURGERY — LAPAROSCOPIC CHOLECYSTECTOMY WITH INTRAOPERATIVE CHOLANGIOGRAM
Anesthesia: General | Site: Abdomen | Wound class: Clean Contaminated

## 2012-07-11 MED ORDER — LIDOCAINE HCL 1 % IJ SOLN
INTRAMUSCULAR | Status: DC | PRN
  Administered 2012-07-11: 8.5 mL

## 2012-07-11 MED ORDER — LACTATED RINGERS IV SOLN: INTRAVENOUS | Status: DC

## 2012-07-11 MED ORDER — BUPIVACAINE-EPINEPHRINE PF 0.25-1:200000 % IJ SOLN
INTRAMUSCULAR | Status: DC | PRN
  Administered 2012-07-11: 8.5 mL

## 2012-07-11 MED ORDER — SODIUM CHLORIDE 0.9 % IV SOLN
1.5000 g | Freq: Once | INTRAVENOUS | Status: AC
  Administered 2012-07-12: 1.5 g via INTRAVENOUS
  Filled 2012-07-11: qty 1.5

## 2012-07-11 MED ORDER — LIDOCAINE HCL (CARDIAC) 20 MG/ML IV SOLN
INTRAVENOUS | Status: DC | PRN
  Administered 2012-07-11: 75 mg via INTRAVENOUS

## 2012-07-11 MED ORDER — IOHEXOL 300 MG/ML  SOLN
INTRAMUSCULAR | Status: DC | PRN
  Administered 2012-07-11: 7 mL via INTRAVENOUS

## 2012-07-11 MED ORDER — LORAZEPAM 2 MG/ML IJ SOLN
0.5000 mg | Freq: Three times a day (TID) | INTRAMUSCULAR | Status: DC | PRN
  Administered 2012-07-11 – 2012-07-13 (×4): 1 mg via INTRAVENOUS
  Filled 2012-07-11 (×4): qty 1

## 2012-07-11 MED ORDER — PRENATAL MULTIVITAMIN CH
1.0000 | ORAL_TABLET | Freq: Every day | ORAL | Status: DC
  Administered 2012-07-11 – 2012-07-13 (×2): 1 via ORAL
  Filled 2012-07-11 (×3): qty 1

## 2012-07-11 MED ORDER — LACTATED RINGERS IV SOLN
INTRAVENOUS | Status: DC | PRN
  Administered 2012-07-11: 07:00:00 via INTRAVENOUS

## 2012-07-11 MED ORDER — PROPOFOL 10 MG/ML IV BOLUS
INTRAVENOUS | Status: DC | PRN
  Administered 2012-07-11: 200 mg via INTRAVENOUS

## 2012-07-11 MED ORDER — LACTATED RINGERS IR SOLN
Status: DC | PRN
  Administered 2012-07-11: 1000 mL

## 2012-07-11 MED ORDER — CYCLOSPORINE 0.05 % OP EMUL
1.0000 [drp] | Freq: Two times a day (BID) | OPHTHALMIC | Status: DC
  Administered 2012-07-11 – 2012-07-13 (×4): 1 [drp] via OPHTHALMIC
  Filled 2012-07-11 (×7): qty 1

## 2012-07-11 MED ORDER — ZOLPIDEM TARTRATE 10 MG PO TABS
10.0000 mg | ORAL_TABLET | Freq: Once | ORAL | Status: AC
  Administered 2012-07-11: 10 mg via ORAL
  Filled 2012-07-11: qty 1

## 2012-07-11 MED ORDER — ROCURONIUM BROMIDE 100 MG/10ML IV SOLN
INTRAVENOUS | Status: DC | PRN
  Administered 2012-07-11: 50 mg via INTRAVENOUS

## 2012-07-11 MED ORDER — DEXTROSE 5 % IV SOLN
2.0000 g | INTRAVENOUS | Status: AC
  Administered 2012-07-11: 2 g via INTRAVENOUS
  Filled 2012-07-11: qty 2

## 2012-07-11 MED ORDER — ONDANSETRON HCL 4 MG/2ML IJ SOLN
INTRAMUSCULAR | Status: DC | PRN
  Administered 2012-07-11: 4 mg via INTRAVENOUS

## 2012-07-11 MED ORDER — GLYCOPYRROLATE 0.2 MG/ML IJ SOLN
INTRAMUSCULAR | Status: DC | PRN
  Administered 2012-07-11: .5 mg via INTRAVENOUS

## 2012-07-11 MED ORDER — IBUPROFEN 600 MG PO TABS
600.0000 mg | ORAL_TABLET | Freq: Four times a day (QID) | ORAL | Status: DC | PRN
  Filled 2012-07-11: qty 1

## 2012-07-11 MED ORDER — NEOSTIGMINE METHYLSULFATE 1 MG/ML IJ SOLN
INTRAMUSCULAR | Status: DC | PRN
  Administered 2012-07-11: 3.5 mg via INTRAVENOUS

## 2012-07-11 MED ORDER — ONDANSETRON HCL 4 MG PO TABS: 8.0000 mg | ORAL_TABLET | Freq: Three times a day (TID) | ORAL | Status: DC | PRN

## 2012-07-11 MED ORDER — LORAZEPAM BOLUS VIA INFUSION: 0.5000 mg | Freq: Three times a day (TID) | INTRAVENOUS | Status: DC | PRN

## 2012-07-11 MED ORDER — FENTANYL CITRATE 0.05 MG/ML IJ SOLN
INTRAMUSCULAR | Status: DC | PRN
  Administered 2012-07-11 (×2): 50 ug via INTRAVENOUS
  Administered 2012-07-11: 100 ug via INTRAVENOUS
  Administered 2012-07-11: 150 ug via INTRAVENOUS

## 2012-07-11 MED ORDER — FENTANYL CITRATE 0.05 MG/ML IJ SOLN
25.0000 ug | INTRAMUSCULAR | Status: DC | PRN
  Administered 2012-07-11 (×4): 25 ug via INTRAVENOUS

## 2012-07-11 MED ORDER — SODIUM CHLORIDE 0.9 % IR SOLN
Status: DC | PRN
  Administered 2012-07-11: 1000 mL

## 2012-07-11 MED ORDER — OXYCODONE-ACETAMINOPHEN 5-325 MG PO TABS
2.0000 | ORAL_TABLET | ORAL | Status: DC | PRN
  Administered 2012-07-12: 2 via ORAL
  Filled 2012-07-11 (×2): qty 1

## 2012-07-11 MED ORDER — MIDAZOLAM HCL 5 MG/5ML IJ SOLN
INTRAMUSCULAR | Status: DC | PRN
  Administered 2012-07-11: 2 mg via INTRAVENOUS

## 2012-07-11 MED ORDER — ACETAMINOPHEN 10 MG/ML IV SOLN
INTRAVENOUS | Status: DC | PRN
  Administered 2012-07-11: 1000 mg via INTRAVENOUS

## 2012-07-11 SURGICAL SUPPLY — 37 items
APPLIER CLIP ROT 10 11.4 M/L (STAPLE) ×2
CANISTER SUCTION 2500CC (MISCELLANEOUS) ×2 IMPLANT
CHLORAPREP W/TINT 26ML (MISCELLANEOUS) ×2 IMPLANT
CLIP APPLIE ROT 10 11.4 M/L (STAPLE) ×1 IMPLANT
CLOTH BEACON ORANGE TIMEOUT ST (SAFETY) ×2 IMPLANT
CONT SPECI 4OZ STER CLIK (MISCELLANEOUS) ×2 IMPLANT
COVER MAYO STAND STRL (DRAPES) ×2 IMPLANT
COVER SURGICAL LIGHT HANDLE (MISCELLANEOUS) ×2 IMPLANT
DECANTER SPIKE VIAL GLASS SM (MISCELLANEOUS) ×2 IMPLANT
DERMABOND ADVANCED (GAUZE/BANDAGES/DRESSINGS) ×1
DERMABOND ADVANCED .7 DNX12 (GAUZE/BANDAGES/DRESSINGS) ×1 IMPLANT
DRAPE C-ARM 42X72 X-RAY (DRAPES) ×2 IMPLANT
DRAPE LAPAROSCOPIC ABDOMINAL (DRAPES) ×2 IMPLANT
ELECT REM PT RETURN 9FT ADLT (ELECTROSURGICAL) ×2
ELECTRODE REM PT RTRN 9FT ADLT (ELECTROSURGICAL) ×1 IMPLANT
GLOVE BIO SURGEON STRL SZ 6 (GLOVE) ×4 IMPLANT
GLOVE BIOGEL PI IND STRL 6.5 (GLOVE) ×1 IMPLANT
GLOVE BIOGEL PI IND STRL 7.0 (GLOVE) ×1 IMPLANT
GLOVE BIOGEL PI INDICATOR 6.5 (GLOVE) ×1
GLOVE BIOGEL PI INDICATOR 7.0 (GLOVE) ×1
GLOVE INDICATOR 6.5 STRL GRN (GLOVE) ×4 IMPLANT
GOWN PREVENTION PLUS XLARGE (GOWN DISPOSABLE) IMPLANT
GOWN PREVENTION PLUS XXLARGE (GOWN DISPOSABLE) ×2 IMPLANT
GOWN STRL NON-REIN LRG LVL3 (GOWN DISPOSABLE) IMPLANT
HEMOSTAT SURGICEL 4X8 (HEMOSTASIS) IMPLANT
KIT BASIN OR (CUSTOM PROCEDURE TRAY) ×2 IMPLANT
POUCH SPECIMEN RETRIEVAL 10MM (ENDOMECHANICALS) ×2 IMPLANT
SET CHOLANGIOGRAPH MIX (MISCELLANEOUS) ×2 IMPLANT
SET IRRIG TUBING LAPAROSCOPIC (IRRIGATION / IRRIGATOR) ×2 IMPLANT
SOLUTION ANTI FOG 6CC (MISCELLANEOUS) ×2 IMPLANT
SUT MNCRL AB 4-0 PS2 18 (SUTURE) ×2 IMPLANT
TOWEL OR 17X26 10 PK STRL BLUE (TOWEL DISPOSABLE) ×4 IMPLANT
TRAY LAP CHOLE (CUSTOM PROCEDURE TRAY) ×2 IMPLANT
TROCAR BLADELESS OPT 5 75 (ENDOMECHANICALS) ×4 IMPLANT
TROCAR XCEL BLUNT TIP 100MML (ENDOMECHANICALS) ×2 IMPLANT
TROCAR XCEL NON-BLD 11X100MML (ENDOMECHANICALS) ×2 IMPLANT
TUBING INSUFFLATION 10FT LAP (TUBING) ×2 IMPLANT

## 2012-07-11 NOTE — Progress Notes (Signed)
Pt does not feel comfortable signing the consent for for her procedure tomorrow and is requesting further explanation.  Crystal Acosta

## 2012-07-11 NOTE — Op Note (Signed)
Laparoscopic Cholecystectomy with IOC Procedure Note  Indications: This patient presents with biliary colic and will undergo laparoscopic cholecystectomy.  Pre-operative Diagnosis: Calculus of bile duct with acute cholecystitis and obstruction  Post-operative Diagnosis: Same  Surgeon: Almond Lint   Anesthesia: General endotracheal anesthesia and local  ASA Class: 2  Procedure Details  The patient was seen again in the Holding Room. The risks, benefits, complications, treatment options, and expected outcomes were discussed with the patient. The possibilities of  bleeding, recurrent infection, damage to nearby structures, the need for additional procedures, failure to diagnose a condition, the possible need to convert to an open procedure, and creating a complication requiring transfusion or operation were discussed with the patient. The likelihood of improving the patient's symptoms with return to their baseline status is good.    The patient and/or family concurred with the proposed plan, giving informed consent. The site of surgery properly noted. The patient was taken to Operating Room, and the procedure verified as Laparoscopic Cholecystectomy with Intraoperative Cholangiogram. A Time Out was held and the above information confirmed.  Prior to the induction of general anesthesia, antibiotic prophylaxis was administered. General endotracheal anesthesia was then administered and tolerated well. After the induction, the abdomen was prepped with Chloraprep and draped in the sterile fashion. The patient was positioned in the supine position.  Local anesthetic agent was injected into the skin near the umbilicus and an incision made. We dissected down to the abdominal fascia with blunt dissection.  The fascia was incised vertically and we entered the peritoneal cavity bluntly.  A pursestring suture of 0-Vicryl was placed around the fascial opening.  The Hasson cannula was inserted and secured with  the stay suture.  Pneumoperitoneum was then created with CO2 and tolerated well without any adverse changes in the patient's vital signs. An 11-mm port was placed in the subxiphoid position.  Two 5-mm ports were placed in the right upper quadrant. All skin incisions were infiltrated with a local anesthetic agent before making the incision and placing the trocars.   We positioned the patient in reverse Trendelenburg, tilted slightly to the patient's left.  The gallbladder was identified, the fundus grasped and retracted cephalad. Adhesions were lysed bluntly and with the electrocautery where indicated, taking care not to injure any adjacent organs or viscus. The infundibulum was grasped and retracted laterally, exposing the peritoneum overlying the triangle of Calot. This was then divided and exposed in a blunt fashion. A critical view of the cystic duct and cystic artery was obtained.  The cystic duct was clearly identified and bluntly dissected circumferentially. The cystic duct was ligated with a clip distally.   An incision was made in the cystic duct and the [facility] cholangiogram catheter introduced. The catheter was secured using a clip. A cholangiogram was then performed, which showed good visualization of the distal and proximal biliary tree with what appeared to be a two filling defects in the common duct. However, contrast did flow easily into the duodenum. The catheter was then removed. These could not be flushed out.  The cystic duct was then ligated with clips and divided. The cystic artery was identified, dissected free, ligated with clips and divided as well.   The gallbladder was dissected from the liver bed in retrograde fashion with the electrocautery. The gallbladder was removed and placed in an Endocatch bag.  The gallbladder and Endocatch bag were then removed through the umbilical port site.  The liver bed was irrigated and inspected. Hemostasis was achieved with the  electrocautery. Copious  irrigation was utilized and was repeatedly aspirated until clear.    We again inspected the right upper quadrant for hemostasis.  Pneumoperitoneum was released as we removed the trocars.   The pursestring suture was used to close the umbilical fascia.  4-0 Monocryl was used to close the skin.   The skin was cleaned and dry, and Dermabond was applied. The patient was then extubated and brought to the recovery room in stable condition. Instrument, sponge, and needle counts were correct at closure and at the conclusion of the case.   Findings:  Cholelithiasis, cholangiogram with a two filling defects in the duct, but contrast went into the duodenum.    Estimated Blood Loss: Minimal         Specimens: Gallbladder           Complications: None; patient tolerated the procedure well.         Disposition: PACU - hemodynamically stable.         Condition: stable

## 2012-07-11 NOTE — Interval H&P Note (Signed)
History and Physical Interval Note:  07/11/2012 7:30 AM  Crystal Acosta  has presented today for surgery, with the diagnosis of cholecystitis  The various methods of treatment have been discussed with the patient and family. After consideration of risks, benefits and other options for treatment, the patient has consented to  Procedure(s) (LRB): LAPAROSCOPIC CHOLECYSTECTOMY WITH INTRAOPERATIVE CHOLANGIOGRAM (N/A) as a surgical intervention .  The patient's history has been reviewed, patient examined, no change in status, stable for surgery.  I have reviewed the patient's chart and labs.  Questions were answered to the patient's satisfaction.     [name]

## 2012-07-11 NOTE — Anesthesia Postprocedure Evaluation (Signed)
  Anesthesia Post-op Note  Patient: Crystal Acosta  Procedure(s) Performed: Procedure(s) (LRB): LAPAROSCOPIC CHOLECYSTECTOMY WITH INTRAOPERATIVE CHOLANGIOGRAM (N/A)  Patient Location: PACU  Anesthesia Type: General  Level of Consciousness: awake and alert   Airway and Oxygen Therapy: Patient Spontanous Breathing  Post-op Pain: mild  Post-op Assessment: Post-op Vital signs reviewed, Patient's Cardiovascular Status Stable, Respiratory Function Stable, Patent Airway and No signs of Nausea or vomiting  Post-op Vital Signs: stable  Complications: No apparent anesthesia complications

## 2012-07-11 NOTE — Consult Note (Signed)
[facility] CON[facility] Reason for consult:CBD stone  Referring Physician: Dr Hurshel Party is an 31 y.o. female.  HPI: Patient has been found to have gallstones and was scheduled for an elective laparoscopic cholecystectomy it had to be admitted due to worsening biliary colic yesterday. She underwent laparoscopic cholecystectomy today with a finding of 2 small filling defects in the common bile duct consistent with floating common bile duct stones.  Past Medical History  Diagnosis Date  . H/O varicella   . Gallstones     Past Surgical History  Procedure Date  . Back surgery     Family History  Problem Relation Age of Onset  . Hypertension Mother   . Heart disease Maternal Grandfather     Social History:  reports that she has been smoking Cigarettes.  She has a 6 pack-year smoking history. She has never used smokeless tobacco. She reports that she does not drink alcohol or use illicit drugs.  Allergies: No Known Allergies  Medications;    . cefOXitin  2 g Intravenous 60 min Pre-Op  . cycloSPORINE  1 drop Both Eyes BID  .  HYDROmorphone (DILAUDID) injection  1 mg Intravenous Once  . HYDROmorphone PCA 0.3 mg/mL   Intravenous Q4H  . prenatal multivitamin  1 tablet Oral Daily  . zolpidem  10 mg Oral Once  . DISCONTD:  ceFAZolin (ANCEF) IV  2 g Intravenous Q8H   PRN Meds diphenhydrAMINE, diphenhydrAMINE, ibuprofen, LORazepam, naloxone, ondansetron (ZOFRAN) IV, ondansetron, oxyCODONE-acetaminophen, sodium chloride, DISCONTD: bupivacaine-EPINEPHrine (PF), DISCONTD: fentaNYL, DISCONTD: iohexol, DISCONTD: lactated ringers, DISCONTD: lidocaine, DISCONTD: LORazepam, DISCONTD: sodium chloride irrigation Results for orders placed during the hospital encounter of 07/10/12 (from the past 48 hour(s))  CBC WITH DIFFERENTIAL     Status: Normal   Collection Time   07/10/12  1:25 PM      Component Value Range Comment   WBC 8.3  4.0 - 10.5 K/uL    RBC 4.79  3.87 - 5.11  MIL/uL    Hemoglobin 14.5  12.0 - 15.0 g/dL    HCT 16.1  09.6 - 04.5 %    MCV 86.6  78.0 - 100.0 fL    MCH 30.3  26.0 - 34.0 pg    MCHC 34.9  30.0 - 36.0 g/dL    RDW 40.9  81.1 - 91.4 %    Platelets 389  150 - 400 K/uL    Neutrophils Relative 68  43 - 77 %    Neutro Abs 5.7  1.7 - 7.7 K/uL    Lymphocytes Relative 24  12 - 46 %    Lymphs Abs 2.0  0.7 - 4.0 K/uL    Monocytes Relative 5  3 - 12 %    Monocytes Absolute 0.4  0.1 - 1.0 K/uL    Eosinophils Relative 2  0 - 5 %    Eosinophils Absolute 0.2  0.0 - 0.7 K/uL    Basophils Relative 1  0 - 1 %    Basophils Absolute 0.0  0.0 - 0.1 K/uL   COMPREHENSIVE METABOLIC PANEL     Status: Abnormal   Collection Time   07/10/12  1:25 PM      Component Value Range Comment   Sodium 137  135 - 145 mEq/L    Potassium 4.2  3.5 - 5.1 mEq/L    Chloride 102  96 - 112 mEq/L    CO2 19  19 - 32 mEq/L    Glucose, Bld 100 (*) 70 -  99 mg/dL    BUN 5 (*) 6 - 23 mg/dL    Creatinine, Ser 0.98  0.50 - 1.10 mg/dL    Calcium 9.7  8.4 - 11.9 mg/dL    Total Protein 7.4  6.0 - 8.3 g/dL    Albumin 4.1  3.5 - 5.2 g/dL    AST 21  0 - 37 U/L SLIGHT HEMOLYSIS   ALT 12  0 - 35 U/L    Alkaline Phosphatase 66  39 - 117 U/L    Total Bilirubin 0.4  0.3 - 1.2 mg/dL    GFR calc non Af Amer >90  >90 mL/min    GFR calc Af Amer >90  >90 mL/min   LIPASE, BLOOD     Status: Normal   Collection Time   07/10/12  1:25 PM      Component Value Range Comment   Lipase 16  11 - 59 U/L   URINALYSIS, ROUTINE W REFLEX MICROSCOPIC     Status: Abnormal   Collection Time   07/10/12  1:34 PM      Component Value Range Comment   Color, Urine YELLOW  YELLOW    APPearance CLOUDY (*) CLEAR    Specific Gravity, Urine 1.007  1.005 - 1.030    pH 8.0  5.0 - 8.0    Glucose, UA NEGATIVE  NEGATIVE mg/dL    Hgb urine dipstick SMALL (*) NEGATIVE    Bilirubin Urine NEGATIVE  NEGATIVE    Ketones, ur NEGATIVE  NEGATIVE mg/dL    Protein, ur NEGATIVE  NEGATIVE mg/dL    Urobilinogen, UA 1.0  0.0 -  1.0 mg/dL    Nitrite NEGATIVE  NEGATIVE    Leukocytes, UA SMALL (*) NEGATIVE   URINE MICROSCOPIC-ADD ON     Status: Normal   Collection Time   07/10/12  1:34 PM      Component Value Range Comment   Squamous Epithelial / LPF RARE  RARE    WBC, UA 0-2  <3 WBC/hpf    RBC / HPF 0-2  <3 RBC/hpf   SURGICAL PCR SCREEN     Status: Normal   Collection Time   07/10/12  9:47 PM      Component Value Range Comment   MRSA, PCR NEGATIVE  NEGATIVE    Staphylococcus aureus NEGATIVE  NEGATIVE   CBC     Status: Normal   Collection Time   07/11/12  3:20 AM      Component Value Range Comment   WBC 10.3  4.0 - 10.5 K/uL    RBC 4.19  3.87 - 5.11 MIL/uL    Hemoglobin 12.7  12.0 - 15.0 g/dL    HCT 14.7  82.9 - 56.2 %    MCV 89.0  78.0 - 100.0 fL    MCH 30.3  26.0 - 34.0 pg    MCHC 34.0  30.0 - 36.0 g/dL    RDW 13.0  86.5 - 78.4 %    Platelets 351  150 - 400 K/uL   LIPASE, BLOOD     Status: Normal   Collection Time   07/11/12  3:20 AM      Component Value Range Comment   Lipase 18  11 - 59 U/L   HEPATIC FUNCTION PANEL     Status: Abnormal   Collection Time   07/11/12  3:20 AM      Component Value Range Comment   Total Protein 5.5 (*) 6.0 - 8.3 g/dL    Albumin 2.9 (*) 3.5 -  5.2 g/dL    AST 12  0 - 37 U/L    ALT 10  0 - 35 U/L    Alkaline Phosphatase 52  39 - 117 U/L    Total Bilirubin 0.1 (*) 0.3 - 1.2 mg/dL    Bilirubin, Direct <1.6  0.0 - 0.3 mg/dL    Indirect Bilirubin NOT CALCULATED  0.3 - 0.9 mg/dL   HCG, SERUM, QUALITATIVE     Status: Normal   Collection Time   07/11/12  3:20 AM      Component Value Range Comment   Preg, Serum NEGATIVE  NEGATIVE   COMPREHENSIVE METABOLIC PANEL     Status: Abnormal   Collection Time   07/11/12  3:20 AM      Component Value Range Comment   Sodium 140  135 - 145 mEq/L    Potassium 3.8  3.5 - 5.1 mEq/L    Chloride 107  96 - 112 mEq/L    CO2 25  19 - 32 mEq/L    Glucose, Bld 92  70 - 99 mg/dL    BUN 8  6 - 23 mg/dL    Creatinine, Ser 1.09  0.50 - 1.10  mg/dL    Calcium 8.2 (*) 8.4 - 10.5 mg/dL    Total Protein 5.4 (*) 6.0 - 8.3 g/dL    Albumin 2.9 (*) 3.5 - 5.2 g/dL    AST 11  0 - 37 U/L    ALT 10  0 - 35 U/L    Alkaline Phosphatase 52  39 - 117 U/L    Total Bilirubin 0.1 (*) 0.3 - 1.2 mg/dL    GFR calc non Af Amer >90  >90 mL/min    GFR calc Af Amer >90  >90 mL/min     Dg Cholangiogram Operative  07/11/2012  *RADIOLOGY REPORT*  Clinical Data:   Cholelithiasis, Laproscopic cholecystectomy  INTRAOPERATIVE CHOLANGIOGRAM  Technique:  Cholangiographic images from the C-arm fluoroscopic device were submitted for interpretation post-operatively.  Please see the procedural report for the amount of contrast and the fluoroscopy time utilized.  Comparison:  06/21/2012  Findings:  2 small round filling defects in the proximal CBD suspicious for nonobstructing choledocholithiasis.  No biliary dilatation.  Visualized biliary confluence, common hepatic duct, cystic duct and common bile duct are all patent.  Contrast opacifies duodenum.  IMPRESSION: Proximal CBD small round filling defects (2) consistent with nonobstructing choledocholithiasis.  Original Report Authenticated By: Judie Petit. Ruel Favors, M.D.                Blood pressure 107/65, pulse 71, temperature 98.9 F (37.2 C), temperature source Oral, resp. rate 24, height 5\' 6"  (1.676 m), weight 88.451 kg (195 lb), last menstrual period 07/09/2012, SpO2 100.00%.  Physical exam:   Gen.-somewhat obese white female who is complaining of abdominal pain but is alert and oriented Eyes-sclerae are nonicteric Lungs-clear Heart-regular rate and rhythm without murmurs or gallops. Abdomen-nondistended and soft but tender in the epigastrium. Bowel sounds are faint.   Assessment: 1. CBD stones on intraoperative cholangiogram. Have reviewed films clearly 2 floating defects consistent with CBD stones.  Plan: 1. We'll proceed with ERCP/sphincterotomy/stone extraction tomorrow at 10:15 by Dr. Ewing Schlein.  Has been approximately 15 minutes discussing the procedure with the patient and her mother. Risk of failure to cannulate, pancreatitis, bleeding discussed. Questions were asked and answered.   Crystal Acosta JR,Sherine Cortese L 07/11/2012, 3:45 PM

## 2012-07-11 NOTE — Transfer of Care (Signed)
Immediate Anesthesia Transfer of Care Note  Patient: Crystal Acosta  Procedure(s) Performed: Procedure(s) (LRB): LAPAROSCOPIC CHOLECYSTECTOMY WITH INTRAOPERATIVE CHOLANGIOGRAM (N/A)  Patient Location: PACU  Anesthesia Type: General  Level of Consciousness: awake, alert  and oriented  Airway & Oxygen Therapy: Patient Spontanous Breathing and Patient connected to face mask oxygen  Post-op Assessment: Report given to PACU RN and Post -op Vital signs reviewed and stable  Post vital signs: Reviewed and stable  Complications: No apparent anesthesia complications

## 2012-07-12 ENCOUNTER — Encounter (HOSPITAL_COMMUNITY): Admission: EM | Disposition: A | Discharge status: Home / Self Care | Payer: Self-pay | Source: Home / Self Care

## 2012-07-12 ENCOUNTER — Encounter (HOSPITAL_COMMUNITY): Payer: Self-pay | Admitting: Anesthesiology

## 2012-07-12 ENCOUNTER — Inpatient Hospital Stay (HOSPITAL_COMMUNITY): Payer: Medicaid Other | Admitting: Anesthesiology

## 2012-07-12 ENCOUNTER — Inpatient Hospital Stay (HOSPITAL_COMMUNITY): Payer: Medicaid Other

## 2012-07-12 ENCOUNTER — Encounter (HOSPITAL_COMMUNITY): Payer: Self-pay

## 2012-07-12 HISTORY — PX: ERCP: SHX5425

## 2012-07-12 LAB — CBC
MCHC: 32.5 g/dL (ref 30.0–36.0)
Platelets: 296 10*3/uL (ref 150–400)
RDW: 14 % (ref 11.5–15.5)
WBC: 11.3 10*3/uL — ABNORMAL HIGH (ref 4.0–10.5)

## 2012-07-12 LAB — COMPREHENSIVE METABOLIC PANEL
ALT: 37 U/L — ABNORMAL HIGH (ref 0–35)
Alkaline Phosphatase: 86 U/L (ref 39–117)
BUN: 6 mg/dL (ref 6–23)
CO2: 28 mEq/L (ref 19–32)
GFR calc Af Amer: >90 mL/min (ref >90)
GFR calc non Af Amer: >90 mL/min (ref >90)
Glucose, Bld: 100 mg/dL — ABNORMAL HIGH (ref 70–99)
Potassium: 3.6 mEq/L (ref 3.5–5.1)
Sodium: 139 mEq/L (ref 135–145)
Total Bilirubin: 0.2 mg/dL — ABNORMAL LOW (ref 0.3–1.2)

## 2012-07-12 LAB — TYPE AND SCREEN
ABO/RH(D): O POS
Antibody Screen: NEGATIVE

## 2012-07-12 SURGERY — ERCP, WITH INTERVENTION IF INDICATED
Anesthesia: Monitor Anesthesia Care

## 2012-07-12 SURGERY — ENDOSCOPIC RETROGRADE CHOLANGIOPANCREATOGRAPHY (ERCP)
Anesthesia: General

## 2012-07-12 MED ORDER — SODIUM CHLORIDE 0.9 % IV SOLN
INTRAVENOUS | Status: DC | PRN
  Administered 2012-07-12: 12:00:00

## 2012-07-12 MED ORDER — SUCCINYLCHOLINE CHLORIDE 20 MG/ML IJ SOLN
INTRAMUSCULAR | Status: DC | PRN
  Administered 2012-07-12: 100 mg via INTRAVENOUS

## 2012-07-12 MED ORDER — MIDAZOLAM HCL 5 MG/5ML IJ SOLN
INTRAMUSCULAR | Status: DC | PRN
  Administered 2012-07-12 (×2): 1 mg via INTRAVENOUS

## 2012-07-12 MED ORDER — LACTATED RINGERS IV SOLN
INTRAVENOUS | Status: DC | PRN
  Administered 2012-07-12: 10:00:00 via INTRAVENOUS

## 2012-07-12 MED ORDER — PROPOFOL 10 MG/ML IV EMUL
INTRAVENOUS | Status: DC | PRN
  Administered 2012-07-12: 200 mg via INTRAVENOUS

## 2012-07-12 MED ORDER — IBUPROFEN 600 MG PO TABS
600.0000 mg | ORAL_TABLET | Freq: Four times a day (QID) | ORAL | Status: DC | PRN
  Filled 2012-07-12: qty 1

## 2012-07-12 MED ORDER — LACTATED RINGERS IV SOLN: INTRAVENOUS | Status: DC

## 2012-07-12 MED ORDER — HYDROMORPHONE HCL PF 1 MG/ML IJ SOLN
INTRAMUSCULAR | Status: AC
  Filled 2012-07-12: qty 2

## 2012-07-12 MED ORDER — KETOROLAC TROMETHAMINE 30 MG/ML IJ SOLN: 30.0000 mg | Freq: Three times a day (TID) | INTRAMUSCULAR | Status: DC | PRN

## 2012-07-12 MED ORDER — FENTANYL CITRATE 0.05 MG/ML IJ SOLN
INTRAMUSCULAR | Status: DC | PRN
  Administered 2012-07-12 (×4): 50 ug via INTRAVENOUS

## 2012-07-12 MED ORDER — FENTANYL CITRATE 0.05 MG/ML IJ SOLN: 25.0000 ug | INTRAMUSCULAR | Status: DC | PRN

## 2012-07-12 MED ORDER — ZOLPIDEM TARTRATE 10 MG PO TABS
10.0000 mg | ORAL_TABLET | Freq: Once | ORAL | Status: AC
  Administered 2012-07-12: 10 mg via ORAL
  Filled 2012-07-12: qty 1

## 2012-07-12 MED ORDER — ZOLPIDEM TARTRATE 10 MG PO TABS: 10.0000 mg | ORAL_TABLET | Freq: Once | ORAL | Status: DC

## 2012-07-12 MED ORDER — LIDOCAINE HCL (CARDIAC) 20 MG/ML IV SOLN
INTRAVENOUS | Status: DC | PRN
  Administered 2012-07-12: 50 mg via INTRAVENOUS

## 2012-07-12 MED ORDER — HYDROMORPHONE HCL PF 2 MG/ML IJ SOLN
1.5000 mg | Freq: Once | INTRAMUSCULAR | Status: AC
  Administered 2012-07-12: 1.5 mg via INTRAVENOUS

## 2012-07-12 NOTE — Op Note (Signed)
[facility] [address] [place], [place]  [address]  ERCP PROCEDURE REPORT  PATIENT:  Crystal Acosta, Crystal Acosta  MR#:  [id number] BIRTHDATE:  September 05, 1981  GENDER:  female  ENDOSCOPIST:  Vida Rigger, MD ASSISTANT:  Claudie Revering, RN York Pellant, Dorisann Frames  PROCEDURE DATE:  07/12/2012 PROCEDURE:  ERCP with balloon dilation, ERCP with sphincterotomy ASA CLASS:  Class I  INDICATIONS:  Positive Intra-Op cholangiogram  MEDICATIONS:  Per general anesthesia TOPICAL ANESTHETIC: Not used   DESCRIPTION OF PROCEDURE:   After the risks benefits and alternatives of the procedure were thoroughly explained, informed consent was obtained.  The Pentax ERCP [id number] endoscope was introduced through the mouth and advanced to the second portion of the duodenum. A normal appearing ampulla was brought into view and using the triple lumen sphincterotome loaded with the JAG Jagwire deep selective cannulation was obtained on the first attempt and the wire was advanced into the intrahepatics and contrast was injected which on the first injection we thought we saw at least 1 small stone. There were no pancreatic duct injections and no wire advancements and we went ahead and initially proceeded with a medium-sized sphincterotomy in the customary fashion until we had adequate biliary drainage and could get the fully bowed sphincterotome easily in and out of the duct. We then exchanged the sphincter tone for the 12-15 mm adjustable balloon and proceeded with 3 balloon pull-through at 12 mm and some minimal sludge was delivered on the first pull-through but no obvious stone was seen we then proceeded with an occlusion cholangiogram which was normal and we then withdrew the wire back to the CBD and advanced to the other intrahepatics branch and proceeded with one more balloon pull-through of the other side again without any stones being delivered or any resistance. The wire and the balloon were  removed The examination was normal with no endoscopic findings except a small second portion of the duodenum diverticula.  The scope was then completely withdrawn from the patient and the procedure terminated. <<PROCEDUREIMAGES>>  COMPLICATIONS:  A complication of none occurred on 07/12/2012 at.  ENDOSCOPIC IMPRESSION: Questionable  CBD stones seen on initial injection with minimal sludge only delivered after multiple balloon pull-through after sphincterotomy as above 2. No pancreatic duct injections or wire advancements 3. Small second portion of the duodenum diverticulum seen but no other endoscopic finding on limited exam  RECOMMENDATIONS: Customary post ERCP orders and observation and if no delayed complication hopefully home soon per surgery and happy to see back when necessary  ______________________________ Vida Rigger, MD  CC:  Almond Lint, MD  n. Rosalie DoctorVida Rigger at 07/12/2012 11:56 AM  Adora Fridge, [phone]

## 2012-07-12 NOTE — Anesthesia Preprocedure Evaluation (Addendum)

## 2012-07-12 NOTE — Preoperative (Signed)
Beta Blockers   Reason not to administer Beta Blockers:Not Applicable 

## 2012-07-12 NOTE — Discharge Summary (Signed)
Physician Discharge Summary  Patient ID: DEREK HUNEYCUTT MRN: [id number] DOB/AGE: [birth year] 31 y.o.  Admit date: 07/10/2012 Discharge date: 07/12/2012  Admission Diagnoses: Calculus of bile duct with acute cholecystitis and obstruction   Discharge Diagnoses: Calculus of bile duct with acute cholecystitis and obstruction, s/p cholecystectomy with IOC 07/11/12, Dr. Donell Beers  ERCP with balloon dilation, ERCP with sphincterotomy, 07/12/12 Dr. Ewing Schlein  Pt has a 57 week old baby at home. She is not breast feeding.  Possible anxiety issues Principal Problem:  *Chronic calculus cholecystitis   PROCEDURES: 1. Laparoscopic cholecystectomy with IOC 07/11/12, Dr. Donell Beers 2.ERCP with sphincterotomy 07/12/12 DR. [facility] Course: Gallstones, chronic calculus cholecystitis with acute biliary colic; maybe developing acute cholecystitis She is already scheduled for laparoscopic cholecystectomy tomorrow. We will go ahead and admit her this afternoon, start her on IV fluids and antibiotics, and get her better pain control for the evening. She was admitted by Dr. Jamey Ripa, and taken to the OR the following day.  Post op IOC showed 2 filling defects and plans were made for ERCP on 07/12/12 by Dr. Ewing Schlein.  She was having trouble with pain and anxiety post op and was placed on dilaudid PCA, and ativan IV.  Post ERCP she was again having significant issues with pain, and was placed on PCA and toradol.IV. She is 5 weeks post partum, but not breast feeding. She continued to have what she called shooting pains RUQ, incision looks fine, her labs are all normal.  We planned on leaving her here POD 1, but she wants to go home and be with her child.  She has lost her IV and does not want to restart it. She is off her PCA and want to go home. She says she will call if she has more problems.  We have told her to continue ibuprofen and percocet for now.    Condition on D/C:  Improving  Disposition: 01-Home or Self  Care   Medication List  As of 07/12/2012  1:59 PM   ASK your doctor about these medications         ibuprofen 600 MG tablet   Commonly known as: ADVIL,MOTRIN   Take 600 mg by mouth every 6 (six) hours as needed.      ondansetron 8 MG tablet   Commonly known as: ZOFRAN   Take 8 mg by mouth every 8 (eight) hours as needed. nausea      oxyCODONE-acetaminophen 5-325 MG per tablet   Commonly known as: PERCOCET/ROXICET   Take 2 tablets by mouth every 4 (four) hours as needed for pain.      prenatal multivitamin Tabs   Take 1 tablet by mouth daily.           Follow-up Information    Follow up with [facility], [place]. Schedule an appointment as soon as possible for a visit in 2 weeks.   Contact information:   [employer] [address] [place] [address] [phone]          Signed: Sherrie George 07/12/2012, 1:59 PM

## 2012-07-12 NOTE — Progress Notes (Signed)
Day of Surgery  Subjective: Having allot of pain, both before the ERCP and even more now, after ERCP. She was maxing out PCA yesterday before her ERCP today.  Objective: Vital signs in last 24 hours: Temp:  [98.1 F (36.7 C)-99.6 F (37.6 C)] 99.6 F (37.6 C) (07/30 1310) Pulse Rate:  [62-79] 67  (07/30 0600) Resp:  [15-25] 25  (07/30 1320) BP: (101-136)/(50-90) 136/89 mmHg (07/30 1320) SpO2:  [93 %-100 %] 93 % (07/30 1320) Last BM Date: 07/09/12  720 PO yesterday, Tm 99.6, WBC up some, new labs in AM  Intake/Output from previous day: 07/29 0701 - 07/30 0700 In: 4076.7 [P.O.:720; I.V.:3356.7] Out: 4150 [Urine:4000] Intake/Output this shift: Total I/O In: 700 [I.V.:700] Out: 1000 [Urine:1000]  General appearance: alert, moderate distress and rapid short respirations GI: incisions look fine, no distension, complaining of pain RUQ.  BS hypoactive  Lab Results:   Basename 07/12/12 0405 07/11/12 0320  WBC 11.3* 10.3  HGB 12.3 12.7  HCT 37.8 37.3  PLT 296 351    BMET  Basename 07/12/12 0405 07/11/12 0320  NA 139 140  K 3.6 3.8  CL 102 107  CO2 28 25  GLUCOSE 100* 92  BUN 6 8  CREATININE 0.75 0.75  CALCIUM 8.9 8.2*   PT/INR No results found for this basename: LABPROT:2,INR:2 in the last 72 hours   Lab 07/12/12 0405 07/11/12 0320 07/10/12 1325  AST 33 1211 21  ALT 37* 1010 12  ALKPHOS 86 5252 66  BILITOT 0.2* 0.1*0.1* 0.4  PROT 5.8* 5.5*5.4* 7.4  ALBUMIN 3.0* 2.9*2.9* 4.1     Lipase     Component Value Date/Time   LIPASE 18 07/11/2012 0320     Studies/Results: Dg Cholangiogram Operative  07/11/2012  *RADIOLOGY REPORT*  Clinical Data:   Cholelithiasis, Laproscopic cholecystectomy  INTRAOPERATIVE CHOLANGIOGRAM  Technique:  Cholangiographic images from the C-arm fluoroscopic device were submitted for interpretation post-operatively.  Please see the procedural report for the amount of contrast and the fluoroscopy time utilized.  Comparison:  06/21/2012   Findings:  2 small round filling defects in the proximal CBD suspicious for nonobstructing choledocholithiasis.  No biliary dilatation.  Visualized biliary confluence, common hepatic duct, cystic duct and common bile duct are all patent.  Contrast opacifies duodenum.  IMPRESSION: Proximal CBD small round filling defects (2) consistent with nonobstructing choledocholithiasis.  Original Report Authenticated By: Judie Petit. Ruel Favors, M.D.   Dg Ercp With Sphincterotomy  07/12/2012  *RADIOLOGY REPORT*  Clinical Data: Choledocholithiasis  ERCP with sphincterotomy  Comparison:  Images obtained during laparoscopic cholecystectomy and intraoperative cholangiogram 07/11/2012, abdominal ultrasound 06/21/2012, CT scan abdomen/pelvis 06/21/2012  Technique: Two single spot images obtained with the fluoroscopic device and submitted for interpretation post-procedure.  ERCP was performed by Dr. Ewing Schlein  Findings: Two single fluoroscopic images obtained during ERCP with sphincterotomy demonstrate an endoscope positioned in the duodenum. The common bile duct is cannulated with a wire.  Partial opacification of the intrahepatic and common bile duct demonstrates no significant ductal dilatation.  IMPRESSION: Fluoroscopic spot images obtained during ERCP with sphincterotomy for choledocholithiasis.  These images were submitted for radiologic interpretation only. Please see the procedural report for the amount of contrast and the fluoroscopy time utilized.  Original Report Authenticated By: Vilma Prader    Medications:    . ampicillin-sulbactam (UNASYN) 1.5 g IVPB  1.5 g Intravenous Once  . cycloSPORINE  1 drop Both Eyes BID  . HYDROmorphone PCA 0.3 mg/mL   Intravenous Q4H  . prenatal multivitamin  1  tablet Oral Daily  . zolpidem  10 mg Oral Once  . zolpidem  10 mg Oral Once    Assessment/Plan Calculus of bile duct with acute cholecystitis and obstruction, s/p cholecystectomy with IOC 07/11/12, Dr. Donell Beers ERCP with balloon dilation,  ERCP with sphincterotomy, 07/12/12 Dr. Ewing Schlein Pt has a 30 week old baby at home.  She is not breast feeding. Some anxiety issues.  She has PRN ativan also started yesterday.  Plan:  Dilaudid bolus while they hook up PCA, Toradol, IV also.  Labs in AM.       LOS: 2 days    Crystal Acosta 07/12/2012

## 2012-07-12 NOTE — Transfer of Care (Signed)
Immediate Anesthesia Transfer of Care Note  Patient: Crystal Acosta  Procedure(s) Performed: Procedure(s) (LRB): ENDOSCOPIC RETROGRADE CHOLANGIOPANCREATOGRAPHY (ERCP) (N/A)  Patient Location: PACU  Anesthesia Type: General  Level of Consciousness: awake, alert , oriented and patient cooperative  Airway & Oxygen Therapy: Patient Spontanous Breathing and Patient connected to face mask oxygen  Post-op Assessment: Report given to PACU RN, Post -op Vital signs reviewed and stable and Patient moving all extremities X 4  Post vital signs: Reviewed and stable  Complications: No apparent anesthesia complications

## 2012-07-12 NOTE — Progress Notes (Signed)
Doing well post -op.    Had ERCP today.  Just sludge in duct.

## 2012-07-12 NOTE — Progress Notes (Signed)
Crystal Acosta 10:02 AM  Subjective: The patient is doing fairly well postop day 1 still having some incisional pain but no new complaint  Objective: Vital signs stable afebrile labs stable exam okay except some tender by her incisions  Assessment: Positive for CBD stones  Plan: Okay to proceed with ERCP and rediscussed risks and benefits with the patient and mother  [facility]

## 2012-07-12 NOTE — Anesthesia Postprocedure Evaluation (Signed)
  Anesthesia Post-op Note  Patient: Crystal Acosta  Procedure(s) Performed: Procedure(s) (LRB): ENDOSCOPIC RETROGRADE CHOLANGIOPANCREATOGRAPHY (ERCP) (N/A)  Patient Location: PACU  Anesthesia Type: General  Level of Consciousness: awake and alert   Airway and Oxygen Therapy: Patient Spontanous Breathing  Post-op Pain: mild  Post-op Assessment: Post-op Vital signs reviewed, Patient's Cardiovascular Status Stable, Respiratory Function Stable, Patent Airway and No signs of Nausea or vomiting  Post-op Vital Signs: stable  Complications: No apparent anesthesia complications

## 2012-07-13 ENCOUNTER — Encounter (HOSPITAL_COMMUNITY): Payer: Self-pay | Admitting: Gastroenterology

## 2012-07-13 LAB — COMPREHENSIVE METABOLIC PANEL
ALT: 33 U/L (ref 0–35)
AST: 25 U/L (ref 0–37)
Albumin: 3 g/dL — ABNORMAL LOW (ref 3.5–5.2)
Alkaline Phosphatase: 85 U/L (ref 39–117)
BUN: 4 mg/dL — ABNORMAL LOW (ref 6–23)
Chloride: 101 mEq/L (ref 96–112)
Potassium: 3.6 mEq/L (ref 3.5–5.1)
Sodium: 137 mEq/L (ref 135–145)
Total Bilirubin: 0.5 mg/dL (ref 0.3–1.2)
Total Protein: 5.9 g/dL — ABNORMAL LOW (ref 6.0–8.3)

## 2012-07-13 LAB — CBC WITH DIFFERENTIAL/PLATELET
Basophils Absolute: 0 10*3/uL (ref 0.0–0.1)
Basophils Relative: 0 % (ref 0–1)
Eosinophils Absolute: 0.3 10*3/uL (ref 0.0–0.7)
Hemoglobin: 12.3 g/dL (ref 12.0–15.0)
MCHC: 34.2 g/dL (ref 30.0–36.0)
Monocytes Relative: 10 % (ref 3–12)
Neutro Abs: 6.1 10*3/uL (ref 1.7–7.7)
Neutrophils Relative %: 63 % (ref 43–77)
Platelets: 285 10*3/uL (ref 150–400)

## 2012-07-13 MED ORDER — OXYCODONE-ACETAMINOPHEN 7.5-325 MG PO TABS: 1.0000 | ORAL_TABLET | ORAL | Status: DC | PRN

## 2012-07-13 MED ORDER — OXYCODONE HCL 5 MG PO TABS: 5.0000 mg | ORAL_TABLET | ORAL | Status: DC | PRN

## 2012-07-13 MED ORDER — POLYETHYLENE GLYCOL 3350 17 G PO PACK
17.0000 g | PACK | Freq: Every day | ORAL | Status: DC
  Administered 2012-07-13: 17 g via ORAL
  Filled 2012-07-13: qty 1

## 2012-07-13 MED ORDER — OXYCODONE-ACETAMINOPHEN 5-325 MG PO TABS
1.0000 | ORAL_TABLET | ORAL | Status: DC | PRN
  Administered 2012-07-13: 1 via ORAL
  Filled 2012-07-13: qty 1

## 2012-07-13 NOTE — Progress Notes (Signed)
[facility] PROGRESS NOTE Subjective Feels some better. Tolerating CLs without worsening pain.  Still c/o upper pain.  Objective: Vital signs in last 24 hours: Temp:  [98.1 F (36.7 C)-99.6 F (37.6 C)] 99.2 F (37.3 C) (07/31 0524) Pulse Rate:  [85-94] 94  (07/30 2102) Resp:  [16-25] 21  (07/31 0524) BP: (98-137)/(50-89) 98/60 mmHg (07/31 0524) SpO2:  [93 %-100 %] 96 % (07/31 0524) FiO2 (%):  [44 %-45 %] 45 % (07/31 0349) Last BM Date: 07/09/12  Intake/Output from previous day: 07/30 0701 - 07/31 0700 In: 3043.3 [I.V.:3043.3] Out: 3900 [Urine:3900] Intake/Output this shift:    PE: Gen-looks ok NAD Abd-obese, +BSs, soft c/o mild tenderness EG  Lab Results:  Basename 07/13/12 0410 07/12/12 0405 07/11/12 0320 07/10/12 1325  WBC 9.7 11.3* 10.3 8.3  HGB 12.3 12.3 12.7 14.5  HCT 36.0 37.8 37.3 41.5  PLT 285 296 351 389   BMET  Basename 07/13/12 0410 07/12/12 0405 07/11/12 0320 07/10/12 1325  NA 137 139 140 137  K 3.6 3.6 3.8 4.2  CL 101 102 107 102  CO2 27 28 25 19   CREATININE 0.68 0.75 4.09 0.62   LFT  Basename 07/13/12 0410 07/12/12 0405 07/11/12 0320  PROT 5.9* 5.8* 5.5*5.4*  AST 25 33 1211  ALT 33 37* 1010  ALKPHOS 85 86 5252  BILITOT 0.5 0.2* 0.1*0.1*  BILIDIR -- -- <0.1  IBILI -- -- NOT CALCULATED   PT/INR No results found for this basename: LABPROT:3,INR:3 in the last 72 hours PANCREAS  Basename 07/13/12 0410 07/11/12 0320 07/10/12 1325  LIPASE 34 18 16         Studies/Results: Dg Cholangiogram Operative  07/11/2012  *RADIOLOGY REPORT*  Clinical Data:   Cholelithiasis, Laproscopic cholecystectomy  INTRAOPERATIVE CHOLANGIOGRAM  Technique:  Cholangiographic images from the C-arm fluoroscopic device were submitted for interpretation post-operatively.  Please see the procedural report for the amount of contrast and the fluoroscopy time utilized.  Comparison:  06/21/2012  Findings:  2 small round filling defects in the proximal CBD  suspicious for nonobstructing choledocholithiasis.  No biliary dilatation.  Visualized biliary confluence, common hepatic duct, cystic duct and common bile duct are all patent.  Contrast opacifies duodenum.  IMPRESSION: Proximal CBD small round filling defects (2) consistent with nonobstructing choledocholithiasis.  Original Report Authenticated By: Judie Petit. Ruel Favors, M.D.   Dg Ercp With Sphincterotomy  07/12/2012  *RADIOLOGY REPORT*  Clinical Data: Choledocholithiasis  ERCP with sphincterotomy  Comparison:  Images obtained during laparoscopic cholecystectomy and intraoperative cholangiogram 07/11/2012, abdominal ultrasound 06/21/2012, CT scan abdomen/pelvis 06/21/2012  Technique: Two single spot images obtained with the fluoroscopic device and submitted for interpretation post-procedure.  ERCP was performed by Dr. Ewing Schlein  Findings: Two single fluoroscopic images obtained during ERCP with sphincterotomy demonstrate an endoscope positioned in the duodenum. The common bile duct is cannulated with a wire.  Partial opacification of the intrahepatic and common bile duct demonstrates no significant ductal dilatation.  IMPRESSION: Fluoroscopic spot images obtained during ERCP with sphincterotomy for choledocholithiasis.  These images were submitted for radiologic interpretation only. Please see the procedural report for the amount of contrast and the fluoroscopy time utilized.  Original Report Authenticated By: Vilma Prader    Medications: I have reviewed the patient's current medications.  Assessment/Plan: 1. CBD sludge.  S/p ERCP/ERS/stone removal. Labs ok no pancreatitis.  Appears to be improving. Advance diet and follow clinically   Lavayah Vita JR,Keno Caraway L 07/13/2012, 8:15 AM

## 2012-07-13 NOTE — Discharge Summary (Signed)
Seen and agree with above.   Follow up in 2-4 weeks.

## 2012-07-13 NOTE — Progress Notes (Signed)
1 Day Post-Op  Subjective: Feels better, eating some, taking PCA for "sharpe shooting pains." No Bm for several days, taking percocet 7.5/325 at home for pain.  Objective: Vital signs in last 24 hours: Temp:  [98.2 F (36.8 C)-99.6 F (37.6 C)] 99.2 F (37.3 C) (07/31 0524) Pulse Rate:  [85-94] 94  (07/30 2102) Resp:  [16-25] 16  (07/31 0800) BP: (98-137)/(60-89) 98/60 mmHg (07/31 0524) SpO2:  [93 %-100 %] 94 % (07/31 0800) FiO2 (%):  [44 %-45 %] 45 % (07/31 0349) Last BM Date: 07/09/12  Diet: Bland, Tm 99.2,labs are normal,  Intake/Output from previous day: 07/30 0701 - 07/31 0700 In: 3043.3 [I.V.:3043.3] Out: 3900 [Urine:3900] Intake/Output this shift: Total I/O In: -  Out: 1000 [Urine:1000]  General appearance: alert, cooperative and no distress Resp: clear to auscultation bilaterally GI: soft, tender still complaining of pain, + flatus, no BM for last few days.  Constipated since pregnancy. Incisions look good.  Lab Results:   Basename 07/13/12 0410 07/12/12 0405  WBC 9.7 11.3*  HGB 12.3 12.3  HCT 36.0 37.8  PLT 285 296    BMET  Basename 07/13/12 0410 07/12/12 0405  NA 137 139  K 3.6 3.6  CL 101 102  CO2 27 28  GLUCOSE 103* 100*  BUN 4* 6  CREATININE 0.68 0.75  CALCIUM 8.6 8.9   PT/INR No results found for this basename: LABPROT:2,INR:2 in the last 72 hours   Lab 07/13/12 0410 07/12/12 0405 07/11/12 0320 07/10/12 1325  AST 25 33 1211 21  ALT 33 37* 1010 12  ALKPHOS 85 86 5252 66  BILITOT 0.5 0.2* 0.1*0.1* 0.4  PROT 5.9* 5.8* 5.5*5.4* 7.4  ALBUMIN 3.0* 3.0* 2.9*2.9* 4.1     Lipase     Component Value Date/Time   LIPASE 34 07/13/2012 0410     Studies/Results: Dg Ercp With Sphincterotomy  07/12/2012  *RADIOLOGY REPORT*  Clinical Data: Choledocholithiasis  ERCP with sphincterotomy  Comparison:  Images obtained during laparoscopic cholecystectomy and intraoperative cholangiogram 07/11/2012, abdominal ultrasound 06/21/2012, CT scan  abdomen/pelvis 06/21/2012  Technique: Two single spot images obtained with the fluoroscopic device and submitted for interpretation post-procedure.  ERCP was performed by Dr. Ewing Schlein  Findings: Two single fluoroscopic images obtained during ERCP with sphincterotomy demonstrate an endoscope positioned in the duodenum. The common bile duct is cannulated with a wire.  Partial opacification of the intrahepatic and common bile duct demonstrates no significant ductal dilatation.  IMPRESSION: Fluoroscopic spot images obtained during ERCP with sphincterotomy for choledocholithiasis.  These images were submitted for radiologic interpretation only. Please see the procedural report for the amount of contrast and the fluoroscopy time utilized.  Original Report Authenticated By: Vilma Prader    Medications:    . cycloSPORINE  1 drop Both Eyes BID  . HYDROmorphone      .  HYDROmorphone (DILAUDID) injection  1.5 mg Intravenous Once  . HYDROmorphone PCA 0.3 mg/mL   Intravenous Q4H  . prenatal multivitamin  1 tablet Oral Daily  . zolpidem  10 mg Oral Once  . DISCONTD: zolpidem  10 mg Oral Once    Assessment/Plan Calculus of bile duct with acute cholecystitis and obstruction, s/p cholecystectomy with IOC 07/11/12, Dr. Donell Beers  ERCP with balloon dilation, ERCP with sphincterotomy, 07/12/12 Dr. Ewing Schlein 5 weeks Post partum Anxiety  Plan:  Mobilize, home when she's off IV pain meds, Miralax today. If she is still here recheck labs in am.       LOS: 3 days    Crystal Acosta  07/13/2012   

## 2012-07-13 NOTE — Progress Notes (Signed)
Working on walking.  Seen, agree. Still having some shooting pain. Advised that if she is not feeling better, it is Ok to stay overnight.

## 2012-07-13 NOTE — Progress Notes (Signed)
Pt reports she is feeling better and would like to be discharged. Will, P.A notified. Said he would be up to see patient. IV leaking; therefore site D/Cd.  Vwilliams,rn.

## 2012-07-14 NOTE — Progress Notes (Signed)
Utilization review completed.  

## 2012-07-19 ENCOUNTER — Ambulatory Visit (INDEPENDENT_AMBULATORY_CARE_PROVIDER_SITE_OTHER): Payer: Medicaid Other | Admitting: Surgery

## 2012-07-20 ENCOUNTER — Telehealth (INDEPENDENT_AMBULATORY_CARE_PROVIDER_SITE_OTHER): Payer: Self-pay | Admitting: General Surgery

## 2012-07-20 ENCOUNTER — Encounter (INDEPENDENT_AMBULATORY_CARE_PROVIDER_SITE_OTHER): Payer: Self-pay | Admitting: General Surgery

## 2012-07-20 ENCOUNTER — Telehealth (INDEPENDENT_AMBULATORY_CARE_PROVIDER_SITE_OTHER): Payer: Self-pay

## 2012-07-20 ENCOUNTER — Ambulatory Visit (INDEPENDENT_AMBULATORY_CARE_PROVIDER_SITE_OTHER): Payer: Medicaid Other | Admitting: General Surgery

## 2012-07-20 VITALS — BP 130/88 | HR 106 | Temp 98.0°F | Ht 66.0 in | Wt 187.0 lb

## 2012-07-20 DIAGNOSIS — K8010 Calculus of gallbladder with chronic cholecystitis without obstruction: Secondary | ICD-10-CM

## 2012-07-20 MED ORDER — CIPROFLOXACIN HCL 750 MG PO TABS: 750.0000 mg | ORAL_TABLET | Freq: Two times a day (BID) | ORAL | Status: AC

## 2012-07-20 MED ORDER — ONDANSETRON 8 MG PO TBDP: 8.0000 mg | ORAL_TABLET | Freq: Three times a day (TID) | ORAL | Status: AC | PRN

## 2012-07-20 MED ORDER — OXYCODONE-ACETAMINOPHEN 7.5-325 MG PO TABS: 1.0000 | ORAL_TABLET | ORAL | Status: AC | PRN

## 2012-07-20 MED ORDER — METRONIDAZOLE 500 MG PO TABS: 500.0000 mg | ORAL_TABLET | Freq: Three times a day (TID) | ORAL | Status: DC

## 2012-07-20 MED ORDER — PROMETHAZINE HCL 25 MG RE SUPP: 25.0000 mg | Freq: Four times a day (QID) | RECTAL | Status: DC | PRN

## 2012-07-20 NOTE — Telephone Encounter (Signed)
Pt called yesterday to request a refill of her pain medication.  I told her we would call in Vicodin 5/325 #30 1-2 po prn pain w/ no refills.

## 2012-07-20 NOTE — Telephone Encounter (Signed)
Spoke with Dr. Donell Beers who wanted the pt to receive a CDC w/ diff, CMET , and CT or abd pelvis.  Because all of this was done at [facility] I had them fax it all over here.  I called the patient to let them know that we wanted them to come in today at 3:45 to be seen by Dr. Donell Beers.

## 2012-07-20 NOTE — Progress Notes (Signed)
HISTORY: Patient is a 31 year old female who is now 9 days status post laparoscopic cholecystectomy. She had cholelithiasis and mild chronic cholecystitis.  She had a look like filling defects on her cholangiogram so she underwent ERCP the next day. No stones were retrieved, but sphincterotomy was performed.  She has continued to have stabbing pain in her right upper quadrant. She also complains of nausea and vomiting. She complains the pain goes around to the back. She went to [facility] emergency department last night. She underwent these lab testing and CT scan. The lab tests showed a white count of 10.2 normal and not clearly count of 518 her hematocrit was also normal with a bilirubin of 0.2 and amylase and lipase in the normal range. Her urinalysis was positive for ketones but negative for bilirubin or urobilinogen. She underwent CT scan which showed a small fluid in the right upper quadrants consistent with postoperative seroma or hematoma. There was no incidents or ear concerning for abscess. She also had some reactive changes around the cecum. She is having fevers at home as high as 102.    EXAM: General:  Alert and oriented. Holding abdomen Incision:  Soft, non tender except at costal margin.  Non distended.     PATHOLOGY: Chronic cholecystitis and cholelithiasis.     ASSESSMENT AND PLAN:   Chronic calculus cholecystitis Patient is having stabbing abdominal pain status post laparoscopic cholecystectomy. She has had a CAT scan which showed a small amount of fluid in the right upper quadrant and reactive changes around the cecum. It is possible that she has a bile leak.  However, her liver function tests and CBC are completely normal.  It would be uncommon for her to have a bile leak without some abnormality in her vital signs or hyperbilirubinemia.   I am concerned that she is having such significant discomfort. I am ordering a HIDA scan to evaluate her for a bile leak. If this is  negative, I would get an MRCP. Given the fact that she is having diarrhea and pain, I would rule out Clostridium difficile. I am placing her on antibiotics for this.  I am also giving her a refill of her narcotics and her antibiotics.      Maudry Diego, MD Surgical Oncology, General & Endocrine Surgery [facility]  No primary provider on file. No ref. provider found

## 2012-07-20 NOTE — Patient Instructions (Signed)
Have written oral disintegrating tablets for zofran and suppositories of phenergan for nausea.  Also refilled oxycodone.  You will need stool specimen for C. Difficile (a type of bacteria that can cause colitis or inflammation of colon)  Have written for antibiotics for colitis.    These have gone to pharmacy.

## 2012-07-20 NOTE — Assessment & Plan Note (Signed)
Patient is having stabbing abdominal pain status post laparoscopic cholecystectomy. She has had a CAT scan which showed a small amount of fluid in the right upper quadrant and reactive changes around the cecum. It is possible that she has a bile leak.  However, her liver function tests and CBC are completely normal.  It would be uncommon for her to have a bile leak without some abnormality in her vital signs or hyperbilirubinemia.   I am concerned that she is having such significant discomfort. I am ordering a HIDA scan to evaluate her for a bile leak. If this is negative, I would get an MRCP. Given the fact that she is having diarrhea and pain, I would rule out Clostridium difficile. I am placing her on antibiotics for this.  I am also giving her a refill of her narcotics and her antibiotics.

## 2012-07-23 LAB — CLOSTRIDIUM DIFFICILE EIA: CDIFTX: NEGATIVE

## 2012-07-25 ENCOUNTER — Other Ambulatory Visit (INDEPENDENT_AMBULATORY_CARE_PROVIDER_SITE_OTHER): Payer: Self-pay

## 2012-07-25 ENCOUNTER — Encounter (HOSPITAL_COMMUNITY)
Admission: RE | Admit: 2012-07-25 | Discharge: 2012-07-25 | Disposition: A | Discharge status: Home / Self Care | Payer: Medicaid Other | Source: Ambulatory Visit | Attending: General Surgery | Admitting: General Surgery

## 2012-07-25 DIAGNOSIS — K8010 Calculus of gallbladder with chronic cholecystitis without obstruction: Secondary | ICD-10-CM

## 2012-07-25 DIAGNOSIS — Z9089 Acquired absence of other organs: Secondary | ICD-10-CM | POA: Insufficient documentation

## 2012-07-25 MED ORDER — TECHNETIUM TC 99M MEBROFENIN IV KIT
5.1000 | PACK | Freq: Once | INTRAVENOUS | Status: AC | PRN
  Administered 2012-07-25: 5 via INTRAVENOUS

## 2012-07-25 MED ORDER — CHOLESTYRAMINE 4 G PO PACK: 1.0000 | PACK | Freq: Two times a day (BID) | ORAL | Status: DC

## 2012-07-25 NOTE — Telephone Encounter (Signed)
[facility]ran called to [facility] [place], [place]. Per Dr Donell Beers. Patient made aware.

## 2012-07-25 NOTE — Telephone Encounter (Signed)
Patient called to see if she could get an RX to help with her diarrhea. She has tried immodium over the counter. Please advise. Call patient at [phone].

## 2012-07-25 NOTE — Addendum Note (Signed)
Addended byLiliana Cline on: 07/25/2012 05:07 PM   Modules accepted: Orders

## 2012-07-25 NOTE — Addendum Note (Signed)
Addended byLiliana Cline on: 07/25/2012 05:00 PM   Modules accepted: Orders

## 2012-07-27 ENCOUNTER — Telehealth (INDEPENDENT_AMBULATORY_CARE_PROVIDER_SITE_OTHER): Payer: Self-pay | Admitting: General Surgery

## 2012-07-27 ENCOUNTER — Other Ambulatory Visit (INDEPENDENT_AMBULATORY_CARE_PROVIDER_SITE_OTHER): Payer: Self-pay | Admitting: General Surgery

## 2012-07-27 DIAGNOSIS — R109 Unspecified abdominal pain: Secondary | ICD-10-CM

## 2012-07-27 NOTE — Telephone Encounter (Signed)
Pt called to report questionable allergy to Questran.  She took it last night and woke today with rash all over.  She has already taken 2 Benadryl.  Denies itching.  Instructed to repeat Benadryl after 6 hours, if rash was responding to it, otherwise no need to try more.  Will update Dr. Donell Beers and call her back with MD response and MRI instructions.

## 2012-07-27 NOTE — Telephone Encounter (Signed)
Mother calling for information, but is not on HIPPA form.  She is helping pt at home and noted caller ID on her cell phone where message was left.  Mother states pt has had diarrhea all day and needs med other than Questran.  Mother then informed to go to pharmacy to pick up "new prescription" when pt is sleeping and have pt call [facility] tomorrow for MRI information.  Mother understands.  Called in Lomotil tabs, # 60, 2 tabs TID prn diarrhea, 1 refill to [facility]:  [phone].

## 2012-07-27 NOTE — Telephone Encounter (Signed)
It sounds as though she is allergic to [place].  I would d/c and try lomotil tabs 2 tabs TID as needed for diarrhea.  # 60, 1 refill.

## 2012-07-28 ENCOUNTER — Encounter (INDEPENDENT_AMBULATORY_CARE_PROVIDER_SITE_OTHER): Payer: Self-pay

## 2012-07-29 ENCOUNTER — Encounter (HOSPITAL_COMMUNITY): Payer: Self-pay | Admitting: *Deleted

## 2012-07-29 ENCOUNTER — Telehealth (INDEPENDENT_AMBULATORY_CARE_PROVIDER_SITE_OTHER): Payer: Self-pay

## 2012-07-29 ENCOUNTER — Observation Stay (HOSPITAL_COMMUNITY): Payer: Medicaid Other

## 2012-07-29 ENCOUNTER — Observation Stay (HOSPITAL_COMMUNITY)
Admission: EM | Admit: 2012-07-29 | Discharge: 2012-08-02 | DRG: 392 | Disposition: A | Discharge status: Home / Self Care | Payer: Medicaid Other | Attending: General Surgery | Admitting: General Surgery

## 2012-07-29 DIAGNOSIS — K3189 Other diseases of stomach and duodenum: Secondary | ICD-10-CM | POA: Insufficient documentation

## 2012-07-29 DIAGNOSIS — M5137 Other intervertebral disc degeneration, lumbosacral region: Secondary | ICD-10-CM | POA: Diagnosis present

## 2012-07-29 DIAGNOSIS — F17200 Nicotine dependence, unspecified, uncomplicated: Secondary | ICD-10-CM | POA: Insufficient documentation

## 2012-07-29 DIAGNOSIS — R1011 Right upper quadrant pain: Principal | ICD-10-CM | POA: Insufficient documentation

## 2012-07-29 DIAGNOSIS — M545 Low back pain, unspecified: Secondary | ICD-10-CM | POA: Insufficient documentation

## 2012-07-29 DIAGNOSIS — R109 Unspecified abdominal pain: Secondary | ICD-10-CM

## 2012-07-29 DIAGNOSIS — M519 Unspecified thoracic, thoracolumbar and lumbosacral intervertebral disc disorder: Secondary | ICD-10-CM | POA: Insufficient documentation

## 2012-07-29 DIAGNOSIS — Z9049 Acquired absence of other specified parts of digestive tract: Secondary | ICD-10-CM

## 2012-07-29 DIAGNOSIS — Z9089 Acquired absence of other organs: Secondary | ICD-10-CM | POA: Insufficient documentation

## 2012-07-29 DIAGNOSIS — IMO0002 Reserved for concepts with insufficient information to code with codable children: Secondary | ICD-10-CM | POA: Diagnosis present

## 2012-07-29 DIAGNOSIS — R197 Diarrhea, unspecified: Secondary | ICD-10-CM

## 2012-07-29 HISTORY — DX: Calculus of gallbladder with chronic cholecystitis without obstruction: K80.10

## 2012-07-29 HISTORY — DX: Anxiety disorder, unspecified: F41.9

## 2012-07-29 LAB — CBC WITH DIFFERENTIAL/PLATELET
Basophils Absolute: 0 10*3/uL (ref 0.0–0.1)
Basophils Relative: 0 % (ref 0–1)
Eosinophils Relative: 3 % (ref 0–5)
HCT: 41 % (ref 36.0–46.0)
Hemoglobin: 13.9 g/dL (ref 12.0–15.0)
MCHC: 33.9 g/dL (ref 30.0–36.0)
MCV: 88.6 fL (ref 78.0–100.0)
Monocytes Absolute: 0.6 10*3/uL (ref 0.1–1.0)
Monocytes Relative: 7 % (ref 3–12)
Neutro Abs: 5.4 10*3/uL (ref 1.7–7.7)
RDW: 13.8 % (ref 11.5–15.5)

## 2012-07-29 LAB — COMPREHENSIVE METABOLIC PANEL
ALT: 21 U/L (ref 0–35)
AST: 15 U/L (ref 0–37)
Albumin: 4.3 g/dL (ref 3.5–5.2)
Alkaline Phosphatase: 73 U/L (ref 39–117)
Calcium: 9.5 mg/dL (ref 8.4–10.5)
GFR calc Af Amer: >90 mL/min (ref >90)
Potassium: 3.6 mEq/L (ref 3.5–5.1)
Sodium: 137 mEq/L (ref 135–145)
Total Protein: 7.3 g/dL (ref 6.0–8.3)

## 2012-07-29 LAB — LIPASE, BLOOD: Lipase: 30 U/L (ref 11–59)

## 2012-07-29 MED ORDER — LACTATED RINGERS IV BOLUS (SEPSIS): 1000.0000 mL | Freq: Three times a day (TID) | INTRAVENOUS | Status: AC | PRN

## 2012-07-29 MED ORDER — KCL IN DEXTROSE-NACL 20-5-0.45 MEQ/L-%-% IV SOLN
INTRAVENOUS | Status: DC
  Administered 2012-07-29 – 2012-07-30 (×2): via INTRAVENOUS
  Administered 2012-07-30: 125 mL via INTRAVENOUS
  Administered 2012-07-31: 06:00:00 via INTRAVENOUS
  Filled 2012-07-29 (×5): qty 1000

## 2012-07-29 MED ORDER — DIPHENOXYLATE-ATROPINE 2.5-0.025 MG/5ML PO LIQD: 10.0000 mL | Freq: Four times a day (QID) | ORAL | Status: DC | PRN

## 2012-07-29 MED ORDER — PANTOPRAZOLE SODIUM 40 MG IV SOLR
40.0000 mg | Freq: Every day | INTRAVENOUS | Status: DC
  Filled 2012-07-29: qty 40

## 2012-07-29 MED ORDER — DIPHENHYDRAMINE HCL 12.5 MG/5ML PO ELIX: 12.5000 mg | ORAL_SOLUTION | Freq: Four times a day (QID) | ORAL | Status: DC | PRN

## 2012-07-29 MED ORDER — ONDANSETRON HCL 4 MG PO TABS: 8.0000 mg | ORAL_TABLET | Freq: Three times a day (TID) | ORAL | Status: DC | PRN

## 2012-07-29 MED ORDER — SODIUM CHLORIDE 0.9 % IV BOLUS (SEPSIS)
1000.0000 mL | Freq: Once | INTRAVENOUS | Status: AC
  Administered 2012-07-29: 1000 mL via INTRAVENOUS

## 2012-07-29 MED ORDER — ZOLPIDEM TARTRATE 10 MG PO TABS
10.0000 mg | ORAL_TABLET | Freq: Every evening | ORAL | Status: DC | PRN
  Administered 2012-07-30 – 2012-08-01 (×2): 10 mg via ORAL
  Filled 2012-07-29 (×2): qty 1

## 2012-07-29 MED ORDER — ONDANSETRON HCL 4 MG/2ML IJ SOLN: 4.0000 mg | Freq: Four times a day (QID) | INTRAMUSCULAR | Status: DC | PRN

## 2012-07-29 MED ORDER — LIP MEDEX EX OINT
1.0000 "application " | TOPICAL_OINTMENT | Freq: Two times a day (BID) | CUTANEOUS | Status: DC
  Administered 2012-07-29 – 2012-08-01 (×5): 1 via TOPICAL
  Filled 2012-07-29 (×3): qty 7

## 2012-07-29 MED ORDER — NALOXONE HCL 0.4 MG/ML IJ SOLN: 0.4000 mg | INTRAMUSCULAR | Status: DC | PRN

## 2012-07-29 MED ORDER — BISMUTH SUBSALICYLATE 262 MG/15ML PO SUSP
30.0000 mL | Freq: Two times a day (BID) | ORAL | Status: DC
  Administered 2012-07-30: 30 mL via ORAL
  Filled 2012-07-29 (×2): qty 236

## 2012-07-29 MED ORDER — ACETAMINOPHEN 650 MG RE SUPP: 650.0000 mg | Freq: Four times a day (QID) | RECTAL | Status: DC | PRN

## 2012-07-29 MED ORDER — FENTANYL CITRATE 0.05 MG/ML IJ SOLN
100.0000 ug | Freq: Once | INTRAMUSCULAR | Status: AC
  Administered 2012-07-29: 100 ug via INTRAVENOUS
  Filled 2012-07-29: qty 2

## 2012-07-29 MED ORDER — DIPHENHYDRAMINE HCL 50 MG/ML IJ SOLN: 12.5000 mg | Freq: Four times a day (QID) | INTRAMUSCULAR | Status: DC | PRN

## 2012-07-29 MED ORDER — BIOTENE DRY MOUTH MT LIQD
15.0000 mL | Freq: Two times a day (BID) | OROMUCOSAL | Status: DC
  Administered 2012-07-30: 15 mL via OROMUCOSAL

## 2012-07-29 MED ORDER — SODIUM CHLORIDE 0.9 % IJ SOLN: 9.0000 mL | INTRAMUSCULAR | Status: DC | PRN

## 2012-07-29 MED ORDER — LOPERAMIDE HCL 2 MG PO CAPS
2.0000 mg | ORAL_CAPSULE | Freq: Every day | ORAL | Status: DC
  Administered 2012-07-29: 2 mg via ORAL
  Filled 2012-07-29 (×2): qty 1

## 2012-07-29 MED ORDER — CHLORHEXIDINE GLUCONATE 0.12 % MT SOLN
15.0000 mL | Freq: Two times a day (BID) | OROMUCOSAL | Status: DC
  Administered 2012-07-30 (×3): 15 mL via OROMUCOSAL
  Filled 2012-07-29 (×6): qty 15

## 2012-07-29 MED ORDER — GADOBENATE DIMEGLUMINE 529 MG/ML IV SOLN
17.0000 mL | Freq: Once | INTRAVENOUS | Status: AC | PRN
  Administered 2012-07-29: 17 mL via INTRAVENOUS

## 2012-07-29 MED ORDER — LOPERAMIDE HCL 2 MG PO CAPS: 2.0000 mg | ORAL_CAPSULE | Freq: Three times a day (TID) | ORAL | Status: DC | PRN

## 2012-07-29 MED ORDER — ONDANSETRON HCL 4 MG/2ML IJ SOLN
4.0000 mg | Freq: Once | INTRAMUSCULAR | Status: AC
  Administered 2012-07-29: 4 mg via INTRAVENOUS
  Filled 2012-07-29: qty 2

## 2012-07-29 MED ORDER — HYDROMORPHONE 0.3 MG/ML IV SOLN
INTRAVENOUS | Status: DC
  Administered 2012-07-29: 22:00:00 via INTRAVENOUS
  Administered 2012-07-30: 1.19 mg via INTRAVENOUS
  Administered 2012-07-30: 2.99 mg via INTRAVENOUS
  Administered 2012-07-30: 07:00:00 via INTRAVENOUS
  Administered 2012-07-30: 1.39 mg via INTRAVENOUS
  Administered 2012-07-30: 4.19 mg via INTRAVENOUS
  Administered 2012-07-30: 4.39 mg via INTRAVENOUS
  Filled 2012-07-29 (×2): qty 25

## 2012-07-29 MED ORDER — ALUM & MAG HYDROXIDE-SIMETH 200-200-20 MG/5ML PO SUSP: 30.0000 mL | Freq: Four times a day (QID) | ORAL | Status: DC | PRN

## 2012-07-29 MED ORDER — SACCHAROMYCES BOULARDII 250 MG PO CAPS
250.0000 mg | ORAL_CAPSULE | Freq: Two times a day (BID) | ORAL | Status: DC
  Administered 2012-07-29 – 2012-08-01 (×6): 250 mg via ORAL
  Filled 2012-07-29 (×9): qty 1

## 2012-07-29 MED ORDER — PSYLLIUM 95 % PO PACK
1.0000 | PACK | Freq: Two times a day (BID) | ORAL | Status: DC
  Administered 2012-07-29 – 2012-07-31 (×4): 1 via ORAL
  Filled 2012-07-29 (×9): qty 1

## 2012-07-29 NOTE — Plan of Care (Signed)
Problem: Phase I Progression Outcomes Goal: Pain controlled with appropriate interventions Outcome: Progressing progressing     

## 2012-07-29 NOTE — ED Notes (Signed)
Nurse in patients room. Will call back.

## 2012-07-29 NOTE — ED Notes (Signed)
Pt had cholecystectomy x 2.5 weeks ago. Continues to have abdominal pain. Pt has had continual right side pain since surgery. Nausea/vomiting. Sent over by MD. Scheduled to have MRI. Has taken antibiotics- day 11 of diarrhea.

## 2012-07-29 NOTE — Telephone Encounter (Signed)
Pt calling stating her symptoms are becoming worse and she does not know what to do. She state her mri is not until tomorrow. Reviewed with Dr Donell Beers. Per Dr Arita Miss  Request pt advised to go to [facility] ER and Dr Donell Beers will call orders to out PA at [facility]. Pt states she understands and will go.

## 2012-07-29 NOTE — H&P (Signed)
Patient seen with KO,PA and agree with A&P - she has a benign abdomen at present. Dr Donell Beers had planned MRCP so will get that, admit for pain control

## 2012-07-29 NOTE — H&P (Signed)
[name] [birth year]  [id number].   Chief Complaint/Reason for Consult: abdominal pain, s/p lap chole HPI: this is a 31 yo female who had her gallbladder removed by Almond Lint 18 days ago.  She had 2 stones in her CBD on IOC.  An ERCP was done where no stones were seen.  She went home.  She has since had sharp stabbing pain in her RUQ.  She has had a CT and HIDA scan which were all negative.  She has had persistent diarrhea that lomotil has helped.  She had a c. Diff checked last Friday that was negative.  She is scheduled for a MRCP tomorrow, but due to continued pain and nausea she came to the [facility] today.  Review of Systems: See HPI, otherwise negative  Family History  Problem Relation Age of Onset  . Hypertension Mother   . Heart disease Maternal Grandfather     Past Medical History  Diagnosis Date  . H/O varicella   . Gallstones     Past Surgical History  Procedure Date  . Back surgery   . Cholecystectomy   . Cholecystectomy 07/11/2012    Procedure: LAPAROSCOPIC CHOLECYSTECTOMY WITH INTRAOPERATIVE CHOLANGIOGRAM;  Surgeon: Almond Lint, MD;  Location: [facility];  Service: General;  Laterality: N/A;  . Ercp 07/12/2012    Procedure: ENDOSCOPIC RETROGRADE CHOLANGIOPANCREATOGRAPHY (ERCP);  Surgeon: Petra Kuba, MD;  Location: [facility];  Service: Endoscopy;  Laterality: N/A;    Social History:  reports that she has been smoking Cigarettes.  She has a 6 pack-year smoking history. She has never used smokeless tobacco. She reports that she does not drink alcohol or use illicit drugs.  Allergies:  Allergies  Allergen Reactions  . Questran (Cholestyramine) Hives     (Not in a hospital admission)  Blood pressure 127/78, pulse 78, temperature 98.4 F (36.9 C), temperature source Oral, resp. rate 16, last menstrual period 07/18/2012, SpO2 100.00%, not currently breastfeeding. Physical Exam: General: pleasant, WD, WN white female who is laying in bed in NAD, but  tearful HEENT: head is normocephalic, atraumatic.  Sclera are noninjected.  PERRL.  Ears and nose without any masses or lesions.  Mouth is pink and moist Heart: regular, rate, and rhythm.  Normal s1,s2. No obvious murmurs, gallops, or rubs noted.  Palpable radial and pedal pulses bilaterally Lungs: CTAB, no wheezes, rhonchi, or rales noted.  Respiratory effort nonlabored Abd: soft, minimal RUQ tenderness, ND, +BS, no masses, hernias, or organomegaly MS: all 4 extremities are symmetrical with no cyanosis, clubbing, or edema. Skin: warm and dry with no masses, lesions, or rashes Psych: A&Ox3 who is teraful at times.    Results for orders placed during the hospital encounter of 07/29/12 (from the past 48 hour(s))  LIPASE, BLOOD     Status: Normal   Collection Time   07/29/12  3:45 PM      Component Value Range Comment   Lipase 30  11 - 59 U/L   COMPREHENSIVE METABOLIC PANEL     Status: Normal   Collection Time   07/29/12  3:45 PM      Component Value Range Comment   Sodium 137  135 - 145 mEq/L    Potassium 3.6  3.5 - 5.1 mEq/L    Chloride 99  96 - 112 mEq/L    CO2 28  19 - 32 mEq/L    Glucose, Bld 95  70 - 99 mg/dL    BUN 8  6 - 23 mg/dL  Creatinine, Ser 0.70  0.50 - 1.10 mg/dL    Calcium 9.5  8.4 - 84.1 mg/dL    Total Protein 7.3  6.0 - 8.3 g/dL    Albumin 4.3  3.5 - 5.2 g/dL    AST 15  0 - 37 U/L    ALT 21  0 - 35 U/L    Alkaline Phosphatase 73  39 - 117 U/L    Total Bilirubin 0.3  0.3 - 1.2 mg/dL    GFR calc non Af Amer >90  >90 mL/min    GFR calc Af Amer >90  >90 mL/min   CBC WITH DIFFERENTIAL     Status: Abnormal   Collection Time   07/29/12  3:45 PM      Component Value Range Comment   WBC 9.0  4.0 - 10.5 K/uL    RBC 4.63  3.87 - 5.11 MIL/uL    Hemoglobin 13.9  12.0 - 15.0 g/dL    HCT 32.4  40.1 - 02.7 %    MCV 88.6  78.0 - 100.0 fL    MCH 30.0  26.0 - 34.0 pg    MCHC 33.9  30.0 - 36.0 g/dL    RDW 25.3  66.4 - 40.3 %    Platelets 571 (*) 150 - 400 K/uL     Neutrophils Relative 60  43 - 77 %    Neutro Abs 5.4  1.7 - 7.7 K/uL    Lymphocytes Relative 29  12 - 46 %    Lymphs Abs 2.7  0.7 - 4.0 K/uL    Monocytes Relative 7  3 - 12 %    Monocytes Absolute 0.6  0.1 - 1.0 K/uL    Eosinophils Relative 3  0 - 5 %    Eosinophils Absolute 0.3  0.0 - 0.7 K/uL    Basophils Relative 0  0 - 1 %    Basophils Absolute 0.0  0.0 - 0.1 K/uL    No results found.     Assessment/Plan 1. Abdominal pain, s/p lap chole  Plan: 1. Will admit the patient for pain control. 2. Order a MRCP to eval this area, however her LFTs are normal so I doubt she has a CBD stone, but will await MRCP. 3. If MRCP negative, would ask GI to eval as all post-op complications would be ruled out.  Efstathios Sawin E 07/29/2012, 4:30 PM

## 2012-07-30 ENCOUNTER — Inpatient Hospital Stay: Admission: RE | Admit: 2012-07-30 | Payer: Medicaid Other | Source: Ambulatory Visit

## 2012-07-30 MED ORDER — QUETIAPINE FUMARATE 100 MG PO TABS
100.0000 mg | ORAL_TABLET | Freq: Two times a day (BID) | ORAL | Status: DC
  Administered 2012-07-30 – 2012-08-01 (×5): 100 mg via ORAL
  Filled 2012-07-30 (×9): qty 1

## 2012-07-30 MED ORDER — ACETAMINOPHEN 325 MG PO TABS: 325.0000 mg | ORAL_TABLET | Freq: Four times a day (QID) | ORAL | Status: DC | PRN

## 2012-07-30 MED ORDER — NAPROXEN 500 MG PO TABS
500.0000 mg | ORAL_TABLET | Freq: Two times a day (BID) | ORAL | Status: DC
  Administered 2012-07-30 – 2012-08-02 (×6): 500 mg via ORAL
  Filled 2012-07-30 (×8): qty 1

## 2012-07-30 MED ORDER — PROMETHAZINE HCL 25 MG/ML IJ SOLN: 12.5000 mg | Freq: Four times a day (QID) | INTRAMUSCULAR | Status: DC | PRN

## 2012-07-30 MED ORDER — HYDROMORPHONE 0.3 MG/ML IV SOLN: INTRAVENOUS | Status: DC

## 2012-07-30 MED ORDER — HYDROMORPHONE HCL PF 1 MG/ML IJ SOLN
0.5000 mg | INTRAMUSCULAR | Status: DC | PRN
  Administered 2012-07-30 – 2012-07-31 (×5): 2 mg via INTRAVENOUS
  Filled 2012-07-30 (×5): qty 2

## 2012-07-30 MED ORDER — LORAZEPAM BOLUS VIA INFUSION
0.5000 mg | Freq: Three times a day (TID) | INTRAVENOUS | Status: DC | PRN
  Filled 2012-07-30: qty 1

## 2012-07-30 MED ORDER — OXYCODONE HCL 5 MG PO TABS
5.0000 mg | ORAL_TABLET | ORAL | Status: DC | PRN
  Administered 2012-07-30: 5 mg via ORAL
  Administered 2012-07-31: 10 mg via ORAL
  Filled 2012-07-30: qty 1
  Filled 2012-07-30: qty 2

## 2012-07-30 NOTE — Progress Notes (Addendum)
Crystal Acosta [id number] [birth year]   Subjective:  No mjr events Pt frustrated w PCA Pain better controlled No more diarrhea Scared  Objective:  Vital signs:  Filed Vitals:   07/30/12 0535 07/30/12 0720 07/30/12 1000 07/30/12 1146  BP: 95/63  95/60   Pulse: 66  65   Temp: 97.8 F (36.6 C)  98.3 F (36.8 C)   TempSrc: Oral  Oral   Resp: 12 13 16 14   Height:      Weight:      SpO2: 98% 98% 98% 99%    Last BM Date: 07/28/12  Intake/Output   Yesterday:  08/16 0701 - 08/17 0700 In: -  Out: 1100 [Urine:1100] This shift:  Total I/O In: 1820.8 [I.V.:1820.8] Out: 600 [Urine:600]  Bowel function:  Flatus: Yes  BM: No  Physical Exam:  General: Pt awake/alert/oriented x4 in no major acute distress Eyes: PERRL, normal EOM. Sclera nonicteric Neuro: CN II-XII intact w/o focal sensory/motor deficits. Lymph: No head/neck/groin lymphadenopathy Psych:  No delerium/psychosis/paranoia.  Tearful but somewhat consolable HENT: Normocephalic, Mucus membranes moist.  No thrush Neck: Supple, No tracheal deviation Chest: No pain.  Good respiratory excursion. CV:  Pulses intact.  Regular rhythm Abdomen: Soft, Nondistended.  Min tender RUQ.  No incarcerated hernias. Ext:  SCDs BLE.  No significant edema.  No cyanosis Skin: No petechiae / purpurae   Problem List:  Principal Problem:  *Abdominal pain Active Problems:  LOW BACK PAIN  DISC DISEASE, LUMBAR  LUMBAR RADICULOPATHY, LEFT  S/P laparoscopic cholecystectomy   Assessment  Crystal Acosta  31 y.o. female       Abd pain of uncertain etiology  Plan:  I reviewed the MRCP with radiology.  Subtle postoperative changes.  Nothing severe.  Fluid collection is rather small.  Not felt to be of much benefit to drain at this time.  Will hold off.  White count not elevated.  Liver function tests not elevated.  If patient not improved or colon and lines, consider percutaneous drainage  Some question of mild inflammation  around the kidney for pyelonephritis.  ? Mild lower lobe per radiology.  We'll get urine culture to see.  Will follow patient off antibiotics.  There is no evidence of infection.  Perhaps the Flagyl is contributing to her nausea and vomiting so follow off that.  Will retry diet.  We'll control nausea aggressively.  We'll try and followup later.  If not improving, consider gastroenterology evaluation.  -VTE prophylaxis- SCDs, etc -mobilize as tolerated to help recovery  Ardeth Sportsman, M.D., F.A.C.S. Gastrointestinal and Minimally Invasive Surgery [facility] [address] [place], [place] [address] [phone] Main / Paging [phone] Voice Mail   07/30/2012  CARE TEAM:  PCP: No primary provider on file.  Outpatient Care Team: Patient Care Team: Vertell Novak., MD as Consulting Physician (Gastroenterology) Almond Lint, MD as Consulting Physician (General Surgery)  Inpatient Treatment Team: Treatment Team: Attending Provider: Bishop Limbo, MD; Rounding Team: Md Montez Morita, MD; Consulting Physician: Almond Lint, MD; Technician: Burnard Bunting, [place]; Registered Nurse: Rometta Emery, RN; Registered Nurse: Earnestine Leys, RN   Results:   Labs: Results for orders placed during the hospital encounter of 07/29/12 (from the past 48 hour(s))  LIPASE, BLOOD     Status: Normal   Collection Time   07/29/12  3:45 PM      Component Value Range Comment   Lipase 30  11 - 59 U/L  COMPREHENSIVE METABOLIC PANEL     Status: Normal   Collection Time   07/29/12  3:45 PM      Component Value Range Comment   Sodium 137  135 - 145 mEq/L    Potassium 3.6  3.5 - 5.1 mEq/L    Chloride 99  96 - 112 mEq/L    CO2 28  19 - 32 mEq/L    Glucose, Bld 95  70 - 99 mg/dL    BUN 8  6 - 23 mg/dL    Creatinine, Ser 0.98  0.50 - 1.10 mg/dL    Calcium 9.5  8.4 - 11.9 mg/dL    Total Protein 7.3  6.0 - 8.3 g/dL    Albumin 4.3  3.5 - 5.2 g/dL    AST 15  0 - 37  U/L    ALT 21  0 - 35 U/L    Alkaline Phosphatase 73  39 - 117 U/L    Total Bilirubin 0.3  0.3 - 1.2 mg/dL    GFR calc non Af Amer >90  >90 mL/min    GFR calc Af Amer >90  >90 mL/min   CBC WITH DIFFERENTIAL     Status: Abnormal   Collection Time   07/29/12  3:45 PM      Component Value Range Comment   WBC 9.0  4.0 - 10.5 K/uL    RBC 4.63  3.87 - 5.11 MIL/uL    Hemoglobin 13.9  12.0 - 15.0 g/dL    HCT 14.7  82.9 - 56.2 %    MCV 88.6  78.0 - 100.0 fL    MCH 30.0  26.0 - 34.0 pg    MCHC 33.9  30.0 - 36.0 g/dL    RDW 13.0  86.5 - 78.4 %    Platelets 571 (*) 150 - 400 K/uL    Neutrophils Relative 60  43 - 77 %    Neutro Abs 5.4  1.7 - 7.7 K/uL    Lymphocytes Relative 29  12 - 46 %    Lymphs Abs 2.7  0.7 - 4.0 K/uL    Monocytes Relative 7  3 - 12 %    Monocytes Absolute 0.6  0.1 - 1.0 K/uL    Eosinophils Relative 3  0 - 5 %    Eosinophils Absolute 0.3  0.0 - 0.7 K/uL    Basophils Relative 0  0 - 1 %    Basophils Absolute 0.0  0.0 - 0.1 K/uL     Imaging / Studies: Mr 3d Recon At Scanner  August 18, 2012  *RADIOLOGY REPORT*  Clinical Data:  Status post cholecystectomy with abdominal pain. Cholecystectomy 3 weeks ago.  MRI ABDOMEN WITHOUT AND WITH CONTRAST (INCLUDING MRCP)  Technique:  Multiplanar multisequence MR imaging of the abdomen was performed both before and after the administration of intravenous contrast. Heavily T2-weighted images of the biliary and pancreatic ducts were obtained, and three-dimensional MRCP images were rendered by post processing.  Contrast: 17mL MULTIHANCE GADOBENATE DIMEGLUMINE 529 MG/ML IV SOLN  Comparison:  Nuclear medicine hepatobiliary study of 07/25/2012. Ultrasound 06/21/2012 and CT of 06/21/2012.  Findings:  Normal heart size without pericardial or pleural effusion.  Within the high right lobe of the liver, 8 mm focus of postcontrast arterial enhancement image 20 of series 1301 is favored to represent a perfusion anomaly.  Normal spleen, stomach. Normal  pancreas, without pancreatic ductal dilatation.  Patient status post cholecystectomy.  Just lateral to the operative bed, positioned along the right lobe of liver,  is a primarily simple appearing fluid collection which measures 1.6 x 1.4 cm on image 26 of series 3.  1.9 cm on image 18 of series 500.  Minimal complexity possible in its dependent portion.  Apparent tract from the operative site most apparent on image 51 of series 1302. Question tract extension to the hepatic capsule laterally on image 56 of  series 1302.  No intrahepatic biliary ductal dilatation.  Normal common duct caliber, without obstructive stone. Tiny focus of T2 hyperintensity on the m r c p image 34 of series 5 is felt to represent a cystic duct remnant.  Normal adrenal glands and left kidney.  There is subtle T2 heterogeneous signal involving the anterior aspect of the interpolar right kidney on image 35 of series 3.  This corresponds to a suspicion of hypoenhancement in this area on image 78 of series 1303.  No abdominal adenopathy.  T2 hyperintensity in the porta hepatis on image 26 of series 3 is favored to be related to upper normal sized nodes.  These measure up to 1.5 cm on image 22 series 3.  IMPRESSION:  1.  Status post cholecystectomy with small fluid collection lateral to the operative bed.  Considerations include a seroma or biloma. Lack of significant complexity argues against hematoma. These results will be called to the ordering clinician or representative by the Radiologist Assistant, and communication documented in the PACS Dashboard. 2.  No biliary ductal dilatation or evidence of choledocholithiasis. 3.  Subtle signal abnormality in the interpolar right kidney. Cannot exclude pyelonephritis.  Correlate with urinary symptoms.  Original Report Authenticated By: Consuello Bossier, M.D.   Mr Abd W/wo Cm/mrcp  07/30/2012  *RADIOLOGY REPORT*  Clinical Data:  Status post cholecystectomy with abdominal pain. Cholecystectomy 3 weeks  ago.  MRI ABDOMEN WITHOUT AND WITH CONTRAST (INCLUDING MRCP)  Technique:  Multiplanar multisequence MR imaging of the abdomen was performed both before and after the administration of intravenous contrast. Heavily T2-weighted images of the biliary and pancreatic ducts were obtained, and three-dimensional MRCP images were rendered by post processing.  Contrast: 17mL MULTIHANCE GADOBENATE DIMEGLUMINE 529 MG/ML IV SOLN  Comparison:  Nuclear medicine hepatobiliary study of 07/25/2012. Ultrasound 06/21/2012 and CT of 06/21/2012.  Findings:  Normal heart size without pericardial or pleural effusion.  Within the high right lobe of the liver, 8 mm focus of postcontrast arterial enhancement image 20 of series 1301 is favored to represent a perfusion anomaly.  Normal spleen, stomach. Normal pancreas, without pancreatic ductal dilatation.  Patient status post cholecystectomy.  Just lateral to the operative bed, positioned along the right lobe of liver, is a primarily simple appearing fluid collection which measures 1.6 x 1.4 cm on image 26 of series 3.  1.9 cm on image 18 of series 500.  Minimal complexity possible in its dependent portion.  Apparent tract from the operative site most apparent on image 51 of series 1302. Question tract extension to the hepatic capsule laterally on image 56 of  series 1302.  No intrahepatic biliary ductal dilatation.  Normal common duct caliber, without obstructive stone. Tiny focus of T2 hyperintensity on the m r c p image 34 of series 5 is felt to represent a cystic duct remnant.  Normal adrenal glands and left kidney.  There is subtle T2 heterogeneous signal involving the anterior aspect of the interpolar right kidney on image 35 of series 3.  This corresponds to a suspicion of hypoenhancement in this area on image 78 of series 1303.  No  abdominal adenopathy.  T2 hyperintensity in the porta hepatis on image 26 of series 3 is favored to be related to upper normal sized nodes.  These measure  up to 1.5 cm on image 22 series 3.  IMPRESSION:  1.  Status post cholecystectomy with small fluid collection lateral to the operative bed.  Considerations include a seroma or biloma. Lack of significant complexity argues against hematoma. These results will be called to the ordering clinician or representative by the Radiologist Assistant, and communication documented in the PACS Dashboard. 2.  No biliary ductal dilatation or evidence of choledocholithiasis. 3.  Subtle signal abnormality in the interpolar right kidney. Cannot exclude pyelonephritis.  Correlate with urinary symptoms.  Original Report Authenticated By: Consuello Bossier, M.D.    Medications / Allergies: per chart  Antibiotics: Anti-infectives    None

## 2012-07-31 MED ORDER — HYDROMORPHONE HCL PF 1 MG/ML IJ SOLN
1.0000 mg | INTRAMUSCULAR | Status: DC | PRN
  Administered 2012-07-31 – 2012-08-01 (×5): 2 mg via INTRAVENOUS
  Administered 2012-08-01: 1 mg via INTRAVENOUS
  Administered 2012-08-01: 2 mg via INTRAVENOUS
  Administered 2012-08-01: 1 mg via INTRAVENOUS
  Administered 2012-08-01 (×3): 2 mg via INTRAVENOUS
  Administered 2012-08-02: 1 mg via INTRAVENOUS
  Filled 2012-07-31: qty 1
  Filled 2012-07-31 (×3): qty 2
  Filled 2012-07-31: qty 1
  Filled 2012-07-31: qty 2
  Filled 2012-07-31: qty 1
  Filled 2012-07-31 (×4): qty 2

## 2012-07-31 MED ORDER — DIPHENOXYLATE-ATROPINE 2.5-0.025 MG/5ML PO LIQD: 10.0000 mL | Freq: Two times a day (BID) | ORAL | Status: DC | PRN

## 2012-07-31 MED ORDER — OXYCODONE HCL 5 MG PO TABS
5.0000 mg | ORAL_TABLET | Freq: Four times a day (QID) | ORAL | Status: DC | PRN
  Administered 2012-07-31: 10 mg via ORAL
  Filled 2012-07-31: qty 2

## 2012-07-31 MED ORDER — ONDANSETRON HCL 4 MG PO TABS
4.0000 mg | ORAL_TABLET | Freq: Three times a day (TID) | ORAL | Status: DC
  Administered 2012-07-31 – 2012-08-02 (×5): 4 mg via ORAL
  Filled 2012-07-31 (×10): qty 1

## 2012-07-31 NOTE — Progress Notes (Signed)
Crystal Acosta [id number] 1981-03-18   Subjective:  No mjr events Pain better controlled but concerned w crampy sharp pains No more diarrhea Chronic nausea but less  Objective:  Vital signs:  Filed Vitals:   07/30/12 1400 07/30/12 1800 07/30/12 2100 07/31/12 0530  BP: 90/60 93/62 100/65 106/71  Pulse: 62 71 73 67  Temp: 98.4 F (36.9 C) 98.5 F (36.9 C) 98.4 F (36.9 C) 98.2 F (36.8 C)  TempSrc: Oral Oral Oral Oral  Resp: 16 18 18 18   Height:      Weight:      SpO2: 96% 95% 96% 96%    Last BM Date: 07/30/12  Intake/Output   Yesterday:  08/17 0701 - 08/18 0700 In: 3804.2 [P.O.:620; I.V.:3184.2] Out: 2100 [Urine:2100] This shift:     Bowel function:  Flatus: Yes  BM: Small, better formed  Physical Exam:  General: Pt awake/alert/oriented x4 in no major acute distress Eyes: PERRL, normal EOM. Sclera nonicteric Neuro: CN II-XII intact w/o focal sensory/motor deficits. Lymph: No head/neck/groin lymphadenopathy Psych:  No delerium/psychosis/paranoia.  More calm & consolable HENT: Normocephalic, Mucus membranes moist.  No thrush Neck: Supple, No tracheal deviation Chest: No pain.  Good respiratory excursion. CV:  Pulses intact.  Regular rhythm Abdomen: Soft, Nondistended.  Min tender RUQ/R chest wall.  No Murphy's sign.  No peritonitis.  No incarcerated hernias. Ext:  SCDs BLE.  No significant edema.  No cyanosis Skin: No petechiae / purpurae   Problem List:  Principal Problem:  *Abdominal pain Active Problems:  LOW BACK PAIN  DISC DISEASE, LUMBAR  LUMBAR RADICULOPATHY, LEFT  S/P laparoscopic cholecystectomy   Assessment  Crystal Acosta  31 y.o. female       Abd pain of uncertain etiology, stabilizing  Postop diarrhea improving  Plan:    -f/u urine culture -follow patient off antibiotics.  -adv diet diet. -nsaids/seroquel for prob MS pain -Zofran TID to control nausea aggressively.   -VTE prophylaxis- SCDs, etc -mobilize as  tolerated to help recovery  -If not improving -consider gastroenterology evaluation -consider percutaneous drainage of small fluid collection on MRCP    Ardeth Sportsman, M.D., F.A.C.S. Gastrointestinal and Minimally Invasive Surgery [facility] [address] [place], [place] [address] [phone] Main / Paging [phone] Voice Mail   07/31/2012  CARE TEAM:  PCP: No primary provider on file.  Outpatient Care Team: Patient Care Team: Vertell Novak., MD as Consulting Physician (Gastroenterology) Almond Lint, MD as Consulting Physician (General Surgery)  Inpatient Treatment Team: Treatment Team: Attending Provider: Bishop Limbo, MD; Rounding Team: Md Montez Morita, MD; Consulting Physician: Almond Lint, MD; Registered Nurse: Rometta Emery, RN; Registered Nurse: Horton Marshall; Registered Nurse: Sydell Axon, RN; Technician: Burnard Bunting, [place]; Respiratory Therapist: Toula Moos, RRT   Results:   Labs: Results for orders placed during the hospital encounter of 07/29/12 (from the past 48 hour(s))  LIPASE, BLOOD     Status: Normal   Collection Time   07/29/12  3:45 PM      Component Value Range Comment   Lipase 30  11 - 59 U/L   COMPREHENSIVE METABOLIC PANEL     Status: Normal   Collection Time   07/29/12  3:45 PM      Component Value Range Comment   Sodium 137  135 - 145 mEq/L    Potassium 3.6  3.5 - 5.1 mEq/L    Chloride 99  96 - 112 mEq/L  CO2 28  19 - 32 mEq/L    Glucose, Bld 95  70 - 99 mg/dL    BUN 8  6 - 23 mg/dL    Creatinine, Ser 4.09  0.50 - 1.10 mg/dL    Calcium 9.5  8.4 - 81.1 mg/dL    Total Protein 7.3  6.0 - 8.3 g/dL    Albumin 4.3  3.5 - 5.2 g/dL    AST 15  0 - 37 U/L    ALT 21  0 - 35 U/L    Alkaline Phosphatase 73  39 - 117 U/L    Total Bilirubin 0.3  0.3 - 1.2 mg/dL    GFR calc non Af Amer >90  >90 mL/min    GFR calc Af Amer >90  >90 mL/min   CBC WITH DIFFERENTIAL     Status:  Abnormal   Collection Time   07/29/12  3:45 PM      Component Value Range Comment   WBC 9.0  4.0 - 10.5 K/uL    RBC 4.63  3.87 - 5.11 MIL/uL    Hemoglobin 13.9  12.0 - 15.0 g/dL    HCT 91.4  78.2 - 95.6 %    MCV 88.6  78.0 - 100.0 fL    MCH 30.0  26.0 - 34.0 pg    MCHC 33.9  30.0 - 36.0 g/dL    RDW 21.3  08.6 - 57.8 %    Platelets 571 (*) 150 - 400 K/uL    Neutrophils Relative 60  43 - 77 %    Neutro Abs 5.4  1.7 - 7.7 K/uL    Lymphocytes Relative 29  12 - 46 %    Lymphs Abs 2.7  0.7 - 4.0 K/uL    Monocytes Relative 7  3 - 12 %    Monocytes Absolute 0.6  0.1 - 1.0 K/uL    Eosinophils Relative 3  0 - 5 %    Eosinophils Absolute 0.3  0.0 - 0.7 K/uL    Basophils Relative 0  0 - 1 %    Basophils Absolute 0.0  0.0 - 0.1 K/uL     Imaging / Studies: Mr 3d Recon At Scanner  29-Aug-2012  *RADIOLOGY REPORT*  Clinical Data:  Status post cholecystectomy with abdominal pain. Cholecystectomy 3 weeks ago.  MRI ABDOMEN WITHOUT AND WITH CONTRAST (INCLUDING MRCP)  Technique:  Multiplanar multisequence MR imaging of the abdomen was performed both before and after the administration of intravenous contrast. Heavily T2-weighted images of the biliary and pancreatic ducts were obtained, and three-dimensional MRCP images were rendered by post processing.  Contrast: 17mL MULTIHANCE GADOBENATE DIMEGLUMINE 529 MG/ML IV SOLN  Comparison:  Nuclear medicine hepatobiliary study of 07/25/2012. Ultrasound 06/21/2012 and CT of 06/21/2012.  Findings:  Normal heart size without pericardial or pleural effusion.  Within the high right lobe of the liver, 8 mm focus of postcontrast arterial enhancement image 20 of series 1301 is favored to represent a perfusion anomaly.  Normal spleen, stomach. Normal pancreas, without pancreatic ductal dilatation.  Patient status post cholecystectomy.  Just lateral to the operative bed, positioned along the right lobe of liver, is a primarily simple appearing fluid collection which measures 1.6 x  1.4 cm on image 26 of series 3.  1.9 cm on image 18 of series 500.  Minimal complexity possible in its dependent portion.  Apparent tract from the operative site most apparent on image 51 of series 1302. Question tract extension to the hepatic capsule laterally on  image 56 of  series 1302.  No intrahepatic biliary ductal dilatation.  Normal common duct caliber, without obstructive stone. Tiny focus of T2 hyperintensity on the m r c p image 34 of series 5 is felt to represent a cystic duct remnant.  Normal adrenal glands and left kidney.  There is subtle T2 heterogeneous signal involving the anterior aspect of the interpolar right kidney on image 35 of series 3.  This corresponds to a suspicion of hypoenhancement in this area on image 78 of series 1303.  No abdominal adenopathy.  T2 hyperintensity in the porta hepatis on image 26 of series 3 is favored to be related to upper normal sized nodes.  These measure up to 1.5 cm on image 22 series 3.  IMPRESSION:  1.  Status post cholecystectomy with small fluid collection lateral to the operative bed.  Considerations include a seroma or biloma. Lack of significant complexity argues against hematoma. These results will be called to the ordering clinician or representative by the Radiologist Assistant, and communication documented in the PACS Dashboard. 2.  No biliary ductal dilatation or evidence of choledocholithiasis. 3.  Subtle signal abnormality in the interpolar right kidney. Cannot exclude pyelonephritis.  Correlate with urinary symptoms.  Original Report Authenticated By: Consuello Bossier, M.D.   Mr Abd W/wo Cm/mrcp  07/30/2012  *RADIOLOGY REPORT*  Clinical Data:  Status post cholecystectomy with abdominal pain. Cholecystectomy 3 weeks ago.  MRI ABDOMEN WITHOUT AND WITH CONTRAST (INCLUDING MRCP)  Technique:  Multiplanar multisequence MR imaging of the abdomen was performed both before and after the administration of intravenous contrast. Heavily T2-weighted images  of the biliary and pancreatic ducts were obtained, and three-dimensional MRCP images were rendered by post processing.  Contrast: 17mL MULTIHANCE GADOBENATE DIMEGLUMINE 529 MG/ML IV SOLN  Comparison:  Nuclear medicine hepatobiliary study of 07/25/2012. Ultrasound 06/21/2012 and CT of 06/21/2012.  Findings:  Normal heart size without pericardial or pleural effusion.  Within the high right lobe of the liver, 8 mm focus of postcontrast arterial enhancement image 20 of series 1301 is favored to represent a perfusion anomaly.  Normal spleen, stomach. Normal pancreas, without pancreatic ductal dilatation.  Patient status post cholecystectomy.  Just lateral to the operative bed, positioned along the right lobe of liver, is a primarily simple appearing fluid collection which measures 1.6 x 1.4 cm on image 26 of series 3.  1.9 cm on image 18 of series 500.  Minimal complexity possible in its dependent portion.  Apparent tract from the operative site most apparent on image 51 of series 1302. Question tract extension to the hepatic capsule laterally on image 56 of  series 1302.  No intrahepatic biliary ductal dilatation.  Normal common duct caliber, without obstructive stone. Tiny focus of T2 hyperintensity on the m r c p image 34 of series 5 is felt to represent a cystic duct remnant.  Normal adrenal glands and left kidney.  There is subtle T2 heterogeneous signal involving the anterior aspect of the interpolar right kidney on image 35 of series 3.  This corresponds to a suspicion of hypoenhancement in this area on image 78 of series 1303.  No abdominal adenopathy.  T2 hyperintensity in the porta hepatis on image 26 of series 3 is favored to be related to upper normal sized nodes.  These measure up to 1.5 cm on image 22 series 3.  IMPRESSION:  1.  Status post cholecystectomy with small fluid collection lateral to the operative bed.  Considerations include a seroma or  biloma. Lack of significant complexity argues against  hematoma. These results will be called to the ordering clinician or representative by the Radiologist Assistant, and communication documented in the PACS Dashboard. 2.  No biliary ductal dilatation or evidence of choledocholithiasis. 3.  Subtle signal abnormality in the interpolar right kidney. Cannot exclude pyelonephritis.  Correlate with urinary symptoms.  Original Report Authenticated By: Consuello Bossier, M.D.    Medications / Allergies: per chart  Antibiotics: Anti-infectives    None

## 2012-07-31 NOTE — Progress Notes (Signed)
Nutrition Brief Note  Patient identified on the Nutrition Risk Report for Unintentional weight loss.   Body mass index is 30.18 kg/(m^2). Pt meets criteria for Obesity based on current BMI. [name] weight has been stable since 07/20/12.   Current diet order is Full liquids, which has progressed from NPO on 07/29/12. Labs and medications reviewed.   No nutrition interventions warranted at this time. If nutrition issues arise, please consult RD.   Leonette Most RD, LDN

## 2012-08-01 ENCOUNTER — Encounter (HOSPITAL_COMMUNITY)
Admission: EM | Disposition: A | Discharge status: Home / Self Care | Payer: Self-pay | Source: Home / Self Care | Attending: Emergency Medicine

## 2012-08-01 ENCOUNTER — Encounter (HOSPITAL_COMMUNITY): Payer: Self-pay | Admitting: *Deleted

## 2012-08-01 ENCOUNTER — Encounter (INDEPENDENT_AMBULATORY_CARE_PROVIDER_SITE_OTHER): Payer: Medicaid Other | Admitting: General Surgery

## 2012-08-01 HISTORY — PX: ESOPHAGOGASTRODUODENOSCOPY: SHX5428

## 2012-08-01 SURGERY — EGD (ESOPHAGOGASTRODUODENOSCOPY)
Anesthesia: Moderate Sedation

## 2012-08-01 MED ORDER — HYDROMORPHONE HCL PF 1 MG/ML IJ SOLN
INTRAMUSCULAR | Status: AC
  Filled 2012-08-01: qty 2

## 2012-08-01 MED ORDER — FENTANYL CITRATE 0.05 MG/ML IJ SOLN
INTRAMUSCULAR | Status: DC | PRN
  Administered 2012-08-01 (×2): 25 ug via INTRAVENOUS

## 2012-08-01 MED ORDER — SODIUM CHLORIDE 0.9 % IV SOLN
INTRAVENOUS | Status: DC
  Administered 2012-08-01: 10 mL/h via INTRAVENOUS

## 2012-08-01 MED ORDER — LORAZEPAM 2 MG/ML IJ SOLN
0.5000 mg | Freq: Three times a day (TID) | INTRAMUSCULAR | Status: DC | PRN
  Administered 2012-08-01: 1 mg via INTRAVENOUS
  Filled 2012-08-01: qty 1

## 2012-08-01 MED ORDER — BUTAMBEN-TETRACAINE-BENZOCAINE 2-2-14 % EX AERO
INHALATION_SPRAY | CUTANEOUS | Status: DC | PRN
  Administered 2012-08-01: 2 via TOPICAL

## 2012-08-01 MED ORDER — DIPHENHYDRAMINE HCL 50 MG/ML IJ SOLN
INTRAMUSCULAR | Status: AC
  Filled 2012-08-01: qty 1

## 2012-08-01 MED ORDER — FENTANYL CITRATE 0.05 MG/ML IJ SOLN
INTRAMUSCULAR | Status: AC
  Filled 2012-08-01: qty 2

## 2012-08-01 MED ORDER — SUCRALFATE 1 G PO TABS
1.0000 g | ORAL_TABLET | Freq: Three times a day (TID) | ORAL | Status: DC
  Administered 2012-08-01 – 2012-08-02 (×2): 1 g via ORAL
  Filled 2012-08-01 (×7): qty 1

## 2012-08-01 MED ORDER — DIPHENHYDRAMINE HCL 50 MG/ML IJ SOLN
INTRAMUSCULAR | Status: DC | PRN
  Administered 2012-08-01: 25 mg via INTRAVENOUS
  Administered 2012-08-01: 12.5 mg via INTRAVENOUS

## 2012-08-01 MED ORDER — MIDAZOLAM HCL 10 MG/2ML IJ SOLN
INTRAMUSCULAR | Status: DC | PRN
  Administered 2012-08-01 (×2): 2.5 mg via INTRAVENOUS
  Administered 2012-08-01: 2 mg via INTRAVENOUS

## 2012-08-01 MED ORDER — OXYCODONE HCL 5 MG PO TABS
10.0000 mg | ORAL_TABLET | ORAL | Status: DC | PRN
  Administered 2012-08-01 – 2012-08-02 (×3): 15 mg via ORAL
  Filled 2012-08-01 (×3): qty 3

## 2012-08-01 MED ORDER — MIDAZOLAM HCL 10 MG/2ML IJ SOLN
INTRAMUSCULAR | Status: AC
  Filled 2012-08-01: qty 2

## 2012-08-01 NOTE — Progress Notes (Signed)
Patient states pain medication helped some pain reported to be at 5.

## 2012-08-01 NOTE — Progress Notes (Signed)
Endoscopy was well tolerated by the patient.   It did show a moderate gastric residual with some bile reflux, which could be a consequence of her frequent narcotic pain medications while in the hospital, or could be reflective of gastric dysmotility and/or bile reflux as her primary condition accounting for her symptoms.  I will start the patient on sucralfate to see if this helps her symptoms.  Florencia Reasons, M.D. [phone]

## 2012-08-01 NOTE — Progress Notes (Signed)
Endoscopy was well tolerated by the patient.   It did show a moderate gastric residual with some bile reflux, which could be a consequence of her frequent narcotic pain medications while in the hospital, or could be reflective of gastric dysmotility and/or bile reflux as her primary condition accounting for her symptoms.  I will start the patient on sucralfate to see if this helps her symptoms.  Pelagia Iacobucci V. Kyrstal Monterrosa, M.D. [phone]   

## 2012-08-01 NOTE — Consult Note (Addendum)
[facility] Consult Note  Referring Provider: No ref. provider found Primary Care Physician:  No primary provider on file. Primary Gastroenterologist:  Dr. Madilyn Fireman  Chief Complaint: Persistent abdominal pain after cholecystectomy. HPI: Crystal Acosta is an 31 y.o. white female  status post cholecystectomy on 729 for symptomatic gallstones with questionable positive intraoperative cholangiogram. She underwent ERCP with sphincterotomy the next day by Dr. Ewing Schlein which showed no bile leak and no retained stones. No pancreatic duct injection was performed and a moderate sphincterotomy was performed. She was discharged but has continued to have sharp intermittent stabbing pain primarily in the medial right upper quadrant sometimes associated with vomiting. She has also had some diarrhea postoperatively but this seems to have subsided in the last 2 or 3 days. She has had an MRI and hepatobiliary scan which showed no bile leak and an MRCP which showed no retained stones. Her lipase and liver function tests have been persistently normal. She has not been started on a PPI as an outpatient and has tried antacids with no relief. She does not use nonsteroidal anti-inflammatory drugs. Her pain seems to be worse lying down and is not necessarily worsened meals. Past Medical History  Diagnosis Date  . H/O varicella   . Gallstones   . 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 Back surgery   . Cholecystectomy   . Cholecystectomy 07/11/2012    Procedure: LAPAROSCOPIC CHOLECYSTECTOMY WITH INTRAOPERATIVE CHOLANGIOGRAM;  Surgeon: Almond Lint, MD;  Location: [facility];  Service: General;  Laterality: N/A;  . Ercp 07/12/2012    Procedure: ENDOSCOPIC RETROGRADE CHOLANGIOPANCREATOGRAPHY (ERCP);  Surgeon: Petra Kuba, MD;  Location: [facility];  Service: Endoscopy;  Laterality: N/A;    Medications Prior to Admission  Medication Sig Dispense Refill  . ibuprofen  (ADVIL,MOTRIN) 600 MG tablet Take 600 mg by mouth every 6 (six) hours as needed.      . ondansetron (ZOFRAN) 8 MG tablet Take 8 mg by mouth every 8 (eight) hours as needed. nausea      . oxyCODONE-acetaminophen (PERCOCET) 7.5-325 MG per tablet Take 1-2 tablets by mouth every 4 (four) hours as needed for pain.  60 tablet  0  . zolpidem (AMBIEN) 10 MG tablet Take 10 mg by mouth at bedtime as needed. Sleep.      . ciprofloxacin (CIPRO) 750 MG tablet Take 1 tablet (750 mg total) by mouth 2 (two) times daily.  20 tablet  0  . metroNIDAZOLE (FLAGYL) 500 MG tablet Take 500 mg by mouth 3 (three) times daily.      . promethazine (PHENERGAN) 25 MG suppository Place 1 suppository (25 mg total) rectally every 6 (six) hours as needed for nausea.  24 each  0    Allergies:  Allergies  Allergen Reactions  . Questran (Cholestyramine) Hives    Family History  Problem Relation Age of Onset  . Hypertension Mother   . Heart disease Maternal Grandfather     Social History:  reports that she has been smoking Cigarettes.  She has a 6 pack-year smoking history. She has never used smokeless tobacco. She reports that she does not drink alcohol or use illicit drugs.  Review of Systems: negative except as described above   Blood pressure 107/71, pulse 81, temperature 97.7 F (36.5 Acosta), temperature source Oral, resp. rate 18, height 5\' 6"  (1.676 m), weight 84.823 kg (187 lb), last menstrual period 07/18/2012, SpO2 97.00%. Head: Normocephalic, without obvious abnormality, atraumatic  Neck: no adenopathy, no carotid bruit, no JVD, supple, symmetrical, trachea midline and thyroid not enlarged, symmetric, no tenderness/mass/nodules Resp: clear to auscultation bilaterally Cardio: regular rate and rhythm, S1, S2 normal, no murmur, click, rub or gallop GI: Abdomen soft slightly distended with mild right upper quadrant tenderness Extremities: extremities normal, atraumatic, no cyanosis or edema  Results for orders placed  during the hospital encounter of 07/29/12 (from the past 48 hour(s))  URINE CULTURE     Status: Normal (Preliminary result)   Collection Time   07/30/12  3:02 PM      Component Value Range Comment   Specimen Description URINE, CLEAN CATCH      Special Requests NONE      Culture  Setup Time 07/30/2012 16:55      Colony Count 40,000 COLONIES/ML      Culture        Value: STAPHYLOCOCCUS SPECIES (COAGULASE NEGATIVE)     Note: RIFAMPIN AND GENTAMICIN SHOULD NOT BE USED AS SINGLE DRUGS FOR TREATMENT OF STAPH INFECTIONS.   Report Status PENDING      No results found.  Assessment: Persistent right upper quadrant abdominal pain after laparoscopic cholecystectomy, multiple studies showing no evidence for bile leak or retained stones. Plan:  Though probably low yield, at this point with no improvement in her pain would recommend a diagnostic EGD to rule out acid peptic disease and this will be done later today. Crystal Acosta 08/01/2012, 11:07 AM

## 2012-08-01 NOTE — Progress Notes (Signed)
Patient with abdominal pain of 6-7 on arrival to endoscopy . Procedure EGD  At 1430. Patient had regular breakfast @ 0930 Dr Matthias Hughs notified moved procedure to 1530. Patient request pain medication because med had been held because of procedure. Dilaudid @mg  given IV Consuello Bossier R.N.

## 2012-08-01 NOTE — Op Note (Signed)
[facility] [address] [place], [address]   ENDOSCOPY PROCEDURE REPORT  PATIENT: Crystal Acosta, Crystal Acosta  MR#: [id number] BIRTHDATE: 06-02-1981 , 31  yrs. old GENDER: Female ENDOSCOPIST:Janiylah Hannis, MD REFERRED BY: Darnell Level, M.D. PROCEDURE DATE:  08/01/2012 PROCEDURE:   EGD w/ biopsy ASA CLASS:    Class II INDICATIONS: persistent abdominal pain s/p chole. MEDICATION: Benadryl 37.5 mg IV, Fentanyl 50 mcg IV, and Versed 7 mg IV TOPICAL ANESTHETIC:   Cetacaine Spray  DESCRIPTION OF PROCEDURE:   After the risks and benefits of the procedure were explained, informed consent was obtained.  The Pentax Gastroscope [id number]  endoscope was introduced through the mouth  and advanced to the duodenum      .  The instrument was slowly withdrawn as the mucosa was fully examined.    the larynx was briefly seen and appeared grossly normal. The esophagus was entered without significant difficulty and was normal in its entirety, without evidence of hiatal hernia, stricture, Mallory-Weiss tear, varices, infection, or neoplasia.  The stomach was entered. It immediately noted on entering the stomach, that there was a moderately large residual, consisting of some amorphous debris consistent with partially digested food, as well as a moderate amount of bile. However, no gastric mucosal abnormalities were observed, such as erythema or erosions, nor were any mass lesion such as polyps or tumors identified. A retroflexed to the cardia was normal.  The pylorus, duodenal bulb, and second duodenum looked normal.   random mucosal biopsies were obtained from the duodenum and antrum of the stomach  The scope was then withdrawn from the patient and the procedure completed.  COMPLICATIONS: There were no complications.   ENDOSCOPIC IMPRESSION: 1. Moderately large gastric residual with evidence of bile reflux 2. This finding would support the idea of empiric  gastroduodenal motility, which could be related to the patient's symptoms, and/or could simply be a consequence of her significant narcotic pain medication usage   RECOMMENDATIONS: 1. Await pathology results 2. Initiate sucralfate on a trial basis, as a bile salt binding agent   _______________________________ eSignedBernette Redbird, MD 08/01/2012 4:28 PM      PATIENT NAME:  Crystal Acosta, Crystal Acosta MR#: [id number]

## 2012-08-01 NOTE — Interval H&P Note (Signed)
History and Physical Interval Note:  08/01/2012 3:47 PM  Crystal Acosta  has presented today for surgery, with the diagnosis of abd.pain  The various methods of treatment have been discussed with the patient. After consideration of risks, benefits and other options for treatment, the patient has consented to  Procedure(s) (LRB): ESOPHAGOGASTRODUODENOSCOPY (EGD) (N/A) as a surgical intervention .  The patient's history has been reviewed, patient examined, no change in status, stable for surgery.  I have reviewed the patient's chart and labs.  Questions were answered to the patient's satisfaction.     Florencia Reasons

## 2012-08-01 NOTE — Progress Notes (Signed)
Patient ID: Crystal Acosta, female   DOB: 17-Jun-1981, 31 y.o.   MRN: [id number]    Subjective: Pt still c/o episodes of pain that are not much improved.  Tried some oxy yesterday, but went back on Dialudid.  Eating a bland diet currently.  Nausea is improved since being here secondary to medications.  Diarrhea has ceased.  Objective: Vital signs in last 24 hours: Temp:  [97.7 F (36.5 C)-97.9 F (36.6 C)] 97.7 F (36.5 C) (08/19 0523) Pulse Rate:  [78-81] 81  (08/19 0523) Resp:  [16-18] 18  (08/19 0523) BP: (93-107)/(60-71) 107/71 mmHg (08/19 0523) SpO2:  [96 %-97 %] 97 % (08/19 0523) Last BM Date: 07/31/12  Intake/Output from previous day: 08/18 0701 - 08/19 0700 In: 220 [P.O.:220] Out: 1800 [Urine:1800] Intake/Output this shift:    PE: Abd: soft, minimally tender, +BS, ND  Lab Results:   Basename 07/29/12 1545  WBC 9.0  HGB 13.9  HCT 41.0  PLT 571*   BMET  Basename 07/29/12 1545  NA 137  K 3.6  CL 99  CO2 28  GLUCOSE 95  BUN 8  CREATININE 0.70  CALCIUM 9.5   PT/INR No results found for this basename: LABPROT:2,INR:2 in the last 72 hours CMP     Component Value Date/Time   NA 137 07/29/2012 1545   K 3.6 07/29/2012 1545   CL 99 07/29/2012 1545   CO2 28 07/29/2012 1545   GLUCOSE 95 07/29/2012 1545   BUN 8 07/29/2012 1545   CREATININE 0.70 07/29/2012 1545   CALCIUM 9.5 07/29/2012 1545   PROT 7.3 07/29/2012 1545   ALBUMIN 4.3 07/29/2012 1545   AST 15 07/29/2012 1545   ALT 21 07/29/2012 1545   ALKPHOS 73 07/29/2012 1545   BILITOT 0.3 07/29/2012 1545   GFRNONAA >90 07/29/2012 1545   GFRAA >90 07/29/2012 1545   Lipase     Component Value Date/Time   LIPASE 30 07/29/2012 1545       Studies/Results: No results found.  Anti-infectives: Anti-infectives    None       Assessment/Plan  1. Abdominal pain, unknown etiology 2. S/p lap chole  Plan: 1. The patient has had a thorough post op lap chole workup to exclude post op complications as the cause of her  pain.  She does have this small fluid collection in the gb fossa, but has a HIDA scan which did not show a leak.  This is more likely to be a seroma.  Given her continued symptoms, I will ask GI to evaluate her to get their recommendations.   LOS: 3 days    Chinmayi Rumer E 08/01/2012

## 2012-08-01 NOTE — Progress Notes (Signed)
General Surgery [facility] Patient seen and examined.  Reviewed GI consult and plans for EGD today.  Will follow. Velora Heckler, MD, [facility] Office: [phone]

## 2012-08-02 ENCOUNTER — Telehealth (INDEPENDENT_AMBULATORY_CARE_PROVIDER_SITE_OTHER): Payer: Self-pay

## 2012-08-02 ENCOUNTER — Encounter (HOSPITAL_COMMUNITY): Payer: Self-pay | Admitting: Gastroenterology

## 2012-08-02 LAB — URINE CULTURE: Colony Count: 40000

## 2012-08-02 MED ORDER — HYDROMORPHONE HCL 2 MG PO TABS: 2.0000 mg | ORAL_TABLET | Freq: Four times a day (QID) | ORAL | Status: AC | PRN

## 2012-08-02 MED ORDER — SUCRALFATE 1 G PO TABS: 1.0000 g | ORAL_TABLET | Freq: Three times a day (TID) | ORAL | Status: DC

## 2012-08-02 NOTE — Telephone Encounter (Signed)
[name] mother called very concerned about her.  She is still having a lot of n/v and abdominal pain.  Upon d/c from the hospital today she was instructed to f/u with Dr. Matthias Hughs for office visit.  I spoke with [facility] and the pt will be scheduled to see Dr. Madilyn Fireman who saw her for initial consult.  Their office will call with her appointment.  Mother is aware.

## 2012-08-02 NOTE — Discharge Summary (Signed)
General Surgery [facility] Agree with plans for discharge home.  Follow up with Dr. Donell Beers (CCS) and Dr. Matthias Hughs (GI). Velora Heckler, MD, [facility] Office: [phone]

## 2012-08-02 NOTE — Discharge Summary (Signed)
  Patient ID: CHARNETTE YOUNKIN MRN: [id number] DOB/AGE: 1981/09/30 31 y.o.  Admit date: 07/29/2012 Discharge date: 08/02/2012  Procedures: Endoscopy  Consults: GI  Reason for Admission: This is a 31 yo female who underwent a lap chole about 3 weeks ago.  Since then she has been having abdominal pain with no definite cause noted.  She was referred back to [facility] for further evaluation.  Admission Diagnoses:  1. Abdominal pain 2. S/p lap chole  Hospital Course: the patient was admitted and pain was controlled.  An MRCP was ordered as previously intended as an outpatient.  This revealed no CBD stones or other significant findings, except a very small fluid collection in the gallbladder fossa.  She had had a negative HIDA so this was not felt to be a biloma.  Due to continued pain, GI was asked to see the patient.  She had an endoscopy which revealed gastric dysmotility and bile reflux.  She was started on Carafate for this.  She was otherwise felt stable for dc as her nausea had improved and she was able to eat.  Discharge Diagnoses:  Principal Problem:  *Abdominal pain Active Problems:  LOW BACK PAIN  DISC DISEASE, LUMBAR  LUMBAR RADICULOPATHY, LEFT  S/P laparoscopic cholecystectomy gastric dysmotility   Discharge Medications: Medication List  As of 08/02/2012  8:46 AM   STOP taking these medications         ciprofloxacin 750 MG tablet      ibuprofen 600 MG tablet      metroNIDAZOLE 500 MG tablet      oxyCODONE-acetaminophen 7.5-325 MG per tablet         TAKE these medications         HYDROmorphone 2 MG tablet   Commonly known as: DILAUDID   Take 1 tablet (2 mg total) by mouth every 6 (six) hours as needed for pain.      ondansetron 8 MG tablet   Commonly known as: ZOFRAN   Take 8 mg by mouth every 8 (eight) hours as needed. nausea      promethazine 25 MG suppository   Commonly known as: PHENERGAN   Place 1 suppository (25 mg total) rectally every 6 (six) hours as needed  for nausea.      sucralfate 1 G tablet   Commonly known as: CARAFATE   Take 1 tablet (1 g total) by mouth 4 (four) times daily -  before meals and at bedtime.      zolpidem 10 MG tablet   Commonly known as: AMBIEN   Take 10 mg by mouth at bedtime as needed. Sleep.            Discharge Instructions: Follow-up Information    Follow up with Florencia Reasons, MD.   Contact information:   [address], [address] [employer], [place]. [place] [address] [phone]          Signed: Letha Cape 08/02/2012, 8:46 AM

## 2012-08-02 NOTE — Progress Notes (Signed)
General Surgery [facility] Agree with above.  EGD results reviewed.  Appreciate Dr. Matthias Hughs assessment and assistance. Velora Heckler, MD, [facility] Office: [phone]

## 2012-08-02 NOTE — Progress Notes (Signed)
Patient ID: Crystal Acosta, female   DOB: 1981-01-08, 31 y.o.   MRN: [id number] 1 Day Post-Op  Subjective: Pt states her pain is still present.  She is taking oxycodone and dilaudid; however she is sitting up in bed appearing very comfortable.  Objective: Vital signs in last 24 hours: Temp:  [98 F (36.7 C)-99 F (37.2 C)] 98.9 F (37.2 C) (08/20 0719) Pulse Rate:  [69-82] 73  (08/20 0719) Resp:  [9-18] 18  (08/20 0719) BP: (68-125)/(39-72) 102/70 mmHg (08/20 0719) SpO2:  [94 %-99 %] 97 % (08/20 0719) Last BM Date: 07/31/12  Intake/Output from previous day: 08/19 0701 - 08/20 0700 In: 75 [P.O.:75] Out: 1100 [Urine:1100] Intake/Output this shift:    PE: Abd: soft, minimally tender, +BS, ND  Lab Results:  No results found for this basename: WBC:2,HGB:2,HCT:2,PLT:2 in the last 72 hours BMET No results found for this basename: NA:2,K:2,CL:2,CO2:2,GLUCOSE:2,BUN:2,CREATININE:2,CALCIUM:2 in the last 72 hours PT/INR No results found for this basename: LABPROT:2,INR:2 in the last 72 hours CMP     Component Value Date/Time   NA 137 07/29/2012 1545   K 3.6 07/29/2012 1545   CL 99 07/29/2012 1545   CO2 28 07/29/2012 1545   GLUCOSE 95 07/29/2012 1545   BUN 8 07/29/2012 1545   CREATININE 0.70 07/29/2012 1545   CALCIUM 9.5 07/29/2012 1545   PROT 7.3 07/29/2012 1545   ALBUMIN 4.3 07/29/2012 1545   AST 15 07/29/2012 1545   ALT 21 07/29/2012 1545   ALKPHOS 73 07/29/2012 1545   BILITOT 0.3 07/29/2012 1545   GFRNONAA >90 07/29/2012 1545   GFRAA >90 07/29/2012 1545   Lipase     Component Value Date/Time   LIPASE 30 07/29/2012 1545       Studies/Results: No results found.  Anti-infectives: Anti-infectives    None       Assessment/Plan  1. S/p lap chole 2. Abdominal pain, ? Gastric dysmotility and bile reflux  Plan: 1. Appreciate GI assistance. 2. Will continue sucralfate at discharge.  She can follow up with GI for further management. 3. No further surgical problems. Patient is  stable for discharge home.   LOS: 4 days    Georgeanna Radziewicz E 08/02/2012

## 2012-08-03 ENCOUNTER — Telehealth (INDEPENDENT_AMBULATORY_CARE_PROVIDER_SITE_OTHER): Payer: Self-pay | Admitting: General Surgery

## 2012-08-03 ENCOUNTER — Telehealth (INDEPENDENT_AMBULATORY_CARE_PROVIDER_SITE_OTHER): Payer: Self-pay

## 2012-08-03 NOTE — Telephone Encounter (Signed)
Rec'd another call from [employer].  Pt has been getting her narcotics from various doctors and pharmacies including [facility], [facility] and [facility] in [place] and [address].  I spoke with Dr. Donell Beers who stated she will not authorize today's Rx for Oxycodone 5mg .  Dr. Donell Beers will not be authorizing any more narcotics on a temporary basis.  Today's Rx has been cancelled.

## 2012-08-03 NOTE — Telephone Encounter (Signed)
[facility] calling for correction to pt's Rx.  Changed to Oxycodone 5mg , since 7.5mg  alone does not exist.

## 2012-08-03 NOTE — Telephone Encounter (Signed)
Nurse called with appt for patient on 08/08/12 with Dr. Dorena Cookey.  The patient has been made aware by [facility].

## 2012-08-03 NOTE — Telephone Encounter (Signed)
Pt calling for more pain meds.  Paged Dr. Donell Beers and updated her that last Oxycodone was issued on 07/20/12 for # 60.  Dr. Gearldine Bienenstock her for # 100, but Dr. Luisa Hart (MD in office to sign Rx) would only OK for # 30, Oxycodone 7.5 mg, 1 tab Q4H prn pain.  Called pt back to pick up script and to strongly encourage her to enthusiastically wean herself off these narcotics using ibuprofen between them.  She said she understood we cannot keep giving her pain meds.

## 2012-08-04 NOTE — ED Provider Notes (Signed)
History     CSN: [id number]  Arrival date & time 07/29/12  1356   First MD Initiated Contact with Patient 07/29/12 1531      Chief Complaint  Patient presents with  . Abdominal Pain     HPI Pt had cholecystectomy x 2.5 weeks ago. Continues to have abdominal pain. Pt has had continual right side pain since surgery. Nausea/vomiting. Sent over by MD. Scheduled to have MRI. Has taken antibiotics- day 11 of diarrhea.   Past Medical History  Diagnosis Date  . H/O varicella   . Gallstones   . 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 Anxiety     anxiety disorder    Past Surgical History  Procedure Date  . Back surgery   . Cholecystectomy   . Cholecystectomy 07/11/2012    Procedure: LAPAROSCOPIC CHOLECYSTECTOMY WITH INTRAOPERATIVE CHOLANGIOGRAM;  Surgeon: Almond Lint, MD;  Location: [facility];  Service: General;  Laterality: N/A;  . Ercp 07/12/2012    Procedure: ENDOSCOPIC RETROGRADE CHOLANGIOPANCREATOGRAPHY (ERCP);  Surgeon: Petra Kuba, MD;  Location: [facility];  Service: Endoscopy;  Laterality: N/A;  . Esophagogastroduodenoscopy 08/01/2012    Procedure: ESOPHAGOGASTRODUODENOSCOPY (EGD);  Surgeon: Florencia Reasons, MD;  Location: [facility];  Service: Endoscopy;  Laterality: N/A;    Family History  Problem Relation Age of Onset  . Hypertension Mother   . Heart disease Maternal Grandfather     History  Substance Use Topics  . Smoking status: Current Everyday Smoker -- 0.5 packs/day for 12 years    Types: Cigarettes  . Smokeless tobacco: Never Used  . Alcohol Use: No    OB History    Grav Para Term Preterm Abortions TAB SAB Ect Mult Living   3 1 1  2  2   1       Review of Systems  All other systems reviewed and are negative.    Allergies  Questran  Home Medications   Current Outpatient Rx  Name Route Sig Dispense Refill  . ONDANSETRON HCL 8 MG PO TABS Oral Take 8 mg by mouth every 8 (eight) hours as needed. nausea    . ZOLPIDEM TARTRATE 10 MG PO TABS  Oral Take 10 mg by mouth at bedtime as needed. Sleep.    Marland Kitchen HYDROMORPHONE HCL 2 MG PO TABS Oral Take 1 tablet (2 mg total) by mouth every 6 (six) hours as needed for pain. 20 tablet 0  . PROMETHAZINE HCL 25 MG RE SUPP Rectal Place 1 suppository (25 mg total) rectally every 6 (six) hours as needed for nausea. 24 each 0  . SUCRALFATE 1 G PO TABS Oral Take 1 tablet (1 g total) by mouth 4 (four) times daily -  before meals and at bedtime. 120 tablet 1    BP 102/70  Pulse 73  Temp 98.9 F (37.2 C) (Oral)  Resp 18  Ht 5\' 6"  (1.676 m)  Wt 187 lb (84.823 kg)  BMI 30.18 kg/m2  SpO2 97%  LMP 07/18/2012  Breastfeeding? No  Physical Exam  Nursing note and vitals reviewed. Constitutional: She is oriented to person, place, and time. She appears well-developed. No distress.  HENT:  Head: Normocephalic and atraumatic.  Eyes: Pupils are equal, round, and reactive to light.  Neck: Normal range of motion.  Cardiovascular: Normal rate and intact distal pulses.   Pulmonary/Chest: No respiratory distress.  Abdominal: Soft. Normal appearance and bowel sounds are normal. She exhibits no distension. There is tenderness.    Musculoskeletal: Normal range of motion.  Neurological:  She is alert and oriented to person, place, and time. No cranial nerve deficit.  Skin: Skin is warm and dry. No rash noted.  Psychiatric: She has a normal mood and affect. Her behavior is normal.    ED Course  Procedures (including critical care time)  Labs Reviewed  CBC WITH DIFFERENTIAL - Abnormal; Notable for the following:    Platelets 571 (*)     All other components within normal limits  LIPASE, BLOOD  COMPREHENSIVE METABOLIC PANEL  URINE CULTURE  LAB REPORT - SCANNED  SURGICAL PATHOLOGY   No results found.   No diagnosis found.  postoperative abdominal pain   MDM  Surgical PA consulted.  Seen and admitted.        Nelia Shi, MD 08/04/12 [phone]

## 2012-08-05 NOTE — Telephone Encounter (Signed)
Close encounter 

## 2012-08-08 ENCOUNTER — Other Ambulatory Visit (HOSPITAL_COMMUNITY): Payer: Self-pay | Admitting: Gastroenterology

## 2012-08-08 ENCOUNTER — Telehealth (INDEPENDENT_AMBULATORY_CARE_PROVIDER_SITE_OTHER): Payer: Self-pay

## 2012-08-08 DIAGNOSIS — R112 Nausea with vomiting, unspecified: Secondary | ICD-10-CM

## 2012-08-08 NOTE — Telephone Encounter (Signed)
Make pt aware that Rx for Percocet 7.5/325mg  #80 1-2 po q 4 hrs prn pain was ready to be picked up at the front desk.  Pt also told me that she was scheduled for a procedure at [facility] on 08/17/12 ordered by Dr. Madilyn Fireman.  I told her after the test we would discuss her case with Dr. Madilyn Fireman, and then contact her.

## 2012-08-08 NOTE — Telephone Encounter (Signed)
[name] mom called in all upset that we are not giving her daughter pain medicine. The mom said they went to the appt to see Dr Madilyn Fireman today and they felt Dr Madilyn Fireman was upset about them coming to him b/c he doesn't know what he can do for the pt if he will even be able to help her. Dr Madilyn Fireman has ordered some test for her to get done and they will call her with the results. I explained to the mom after she got thru talking that I couldn't go over the pt's record with her b/c the pt declined anyone to be on the hippa form and the pt would have to put her name on the form for me to go over anything with her. I asked if the pt was there with there and she was so the pt got on the phone. I spoke to the pt about the phone notes in the system stating that we were alerted last week from several ph[facility]cies [facility] and [facility] that she had placed several narcotic Rx's from different doctors. This info was given to Dr Donell Beers who declined the last script till we figure out some more details on this sitution. The pt wants Dr Donell Beers to know that she did have a Dilaudid Rx at home that was given to her from the hospital but it is too strong for her to take now at home with her caring for the 54wk old baby. The pt is requesting the Oxycodone 7.5mg  b/c the pt said this works the best for her pain. The pt also stated that she has been to the ER prob. 10 times that she did see other doctors that gave her pain medicine when she left the ER so that is prob. The alert the pharmacy is talking about with her seeing other physicians. The pt said she is not a drug seeker and she doesn't see other doctors trying to get pain med. That she has just been so many times to the ER and she goes to the nearest pharmacy when getting them filled. I just advised pt that I would send a note Dr Donell Beers and we would get back in touch with her.

## 2012-08-17 ENCOUNTER — Other Ambulatory Visit (HOSPITAL_COMMUNITY): Payer: Medicaid Other

## 2012-08-19 ENCOUNTER — Telehealth (INDEPENDENT_AMBULATORY_CARE_PROVIDER_SITE_OTHER): Payer: Self-pay

## 2012-08-19 DIAGNOSIS — G8918 Other acute postprocedural pain: Secondary | ICD-10-CM

## 2012-08-19 MED ORDER — OXYCODONE-ACETAMINOPHEN 5-325 MG PO TABS: 1.0000 | ORAL_TABLET | ORAL | Status: AC | PRN

## 2012-08-19 NOTE — Telephone Encounter (Signed)
Percocet prescription written and put at front desk for pick up.

## 2012-08-19 NOTE — Telephone Encounter (Signed)
Patient called wanting percocet refill. Please advise.Marland KitchenMarland Kitchen

## 2012-08-29 ENCOUNTER — Encounter (INDEPENDENT_AMBULATORY_CARE_PROVIDER_SITE_OTHER): Payer: Self-pay

## 2012-08-30 ENCOUNTER — Telehealth (INDEPENDENT_AMBULATORY_CARE_PROVIDER_SITE_OTHER): Payer: Self-pay | Admitting: General Surgery

## 2012-08-30 NOTE — Telephone Encounter (Signed)
Pt called and wanted a Rx for Percocet 5/325. I paged Dr Donell Beers and she stated that pt can have another written Rx but pt can have Percocet 5/325 1-2 q 6 hrs #40 prn for pain. I went to Dr Ezzard Standing to sign the Rx, he was the urgent Doctor

## 2012-09-05 ENCOUNTER — Other Ambulatory Visit (INDEPENDENT_AMBULATORY_CARE_PROVIDER_SITE_OTHER): Payer: Self-pay | Admitting: General Surgery

## 2012-09-05 ENCOUNTER — Telehealth (INDEPENDENT_AMBULATORY_CARE_PROVIDER_SITE_OTHER): Payer: Self-pay | Admitting: General Surgery

## 2012-09-05 ENCOUNTER — Telehealth (INDEPENDENT_AMBULATORY_CARE_PROVIDER_SITE_OTHER): Payer: Self-pay

## 2012-09-05 DIAGNOSIS — R109 Unspecified abdominal pain: Secondary | ICD-10-CM

## 2012-09-05 NOTE — Telephone Encounter (Signed)
Pt has scheduled an appt with Dr. Norma Fredrickson, [facility] in [facility] [phone].  I called their office to make them aware of her history with Dr. Donell Beers.  I will fax CT and lab results when available.  The patient was made aware that if these results were normal, Dr. Donell Beers would not be prescribing any more pain medication in the future.  The patient said she understood.

## 2012-09-05 NOTE — Telephone Encounter (Signed)
Pt called for refill of Percocet.  Paged Dr. Donell Beers who OKd refill of Percocet 5/325 mg, # 40, 1-2 po Q4-6H prn pain , no refill (signed by Dr. Jamey Ripa.)  Additional orders given to [facility].

## 2012-09-07 ENCOUNTER — Ambulatory Visit (INDEPENDENT_AMBULATORY_CARE_PROVIDER_SITE_OTHER): Payer: Self-pay

## 2012-09-08 ENCOUNTER — Other Ambulatory Visit: Payer: Medicaid Other

## 2012-09-09 ENCOUNTER — Other Ambulatory Visit: Payer: Medicaid Other

## 2012-09-16 ENCOUNTER — Telehealth (INDEPENDENT_AMBULATORY_CARE_PROVIDER_SITE_OTHER): Payer: Self-pay | Admitting: General Surgery

## 2012-09-16 NOTE — Telephone Encounter (Signed)
Pt called to inform Dr. Donell Beers that she is scheduled for CT scan on 09-21-12 and would like a copy of report faxed to her GI DR. Toledo at [employer] Also does she need any lab work?/gy

## 2012-09-21 ENCOUNTER — Other Ambulatory Visit: Payer: Medicaid Other

## 2012-09-21 ENCOUNTER — Telehealth (INDEPENDENT_AMBULATORY_CARE_PROVIDER_SITE_OTHER): Payer: Self-pay

## 2012-09-21 ENCOUNTER — Ambulatory Visit
Admission: RE | Admit: 2012-09-21 | Discharge: 2012-09-21 | Disposition: A | Discharge status: Home / Self Care | Payer: Medicaid Other | Source: Ambulatory Visit | Attending: General Surgery | Admitting: General Surgery

## 2012-09-21 DIAGNOSIS — R109 Unspecified abdominal pain: Secondary | ICD-10-CM

## 2012-09-21 DIAGNOSIS — G8929 Other chronic pain: Secondary | ICD-10-CM

## 2012-09-21 MED ORDER — IOHEXOL 300 MG/ML  SOLN
100.0000 mL | Freq: Once | INTRAMUSCULAR | Status: AC | PRN
  Administered 2012-09-21: 100 mL via INTRAVENOUS
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
## 2012-09-26 ENCOUNTER — Telehealth (INDEPENDENT_AMBULATORY_CARE_PROVIDER_SITE_OTHER): Payer: Self-pay

## 2012-09-26 NOTE — Telephone Encounter (Signed)
Call the patient with her scan results.

## 2012-09-27 ENCOUNTER — Telehealth (INDEPENDENT_AMBULATORY_CARE_PROVIDER_SITE_OTHER): Payer: Self-pay

## 2012-09-27 NOTE — Telephone Encounter (Signed)
Spoke with the patient about CT results which were completely normal.  According to the patient her pain is still severe.  I explained that Dr. Donell Beers would no longer be giving her pain medication, and to call her PCP to pursue possible options.

## 2013-11-12 ENCOUNTER — Emergency Department (HOSPITAL_COMMUNITY)
Admission: EM | Admit: 2013-11-12 | Discharge: 2013-11-12 | Disposition: A | Discharge status: Home / Self Care | Payer: Medicaid Other | Attending: Emergency Medicine | Admitting: Emergency Medicine

## 2013-11-12 ENCOUNTER — Encounter (HOSPITAL_COMMUNITY): Payer: Self-pay | Admitting: Emergency Medicine

## 2013-11-12 DIAGNOSIS — L0291 Cutaneous abscess, unspecified: Secondary | ICD-10-CM

## 2013-11-12 DIAGNOSIS — F17200 Nicotine dependence, unspecified, uncomplicated: Secondary | ICD-10-CM | POA: Insufficient documentation

## 2013-11-12 DIAGNOSIS — Z8659 Personal history of other mental and behavioral disorders: Secondary | ICD-10-CM | POA: Insufficient documentation

## 2013-11-12 DIAGNOSIS — L0231 Cutaneous abscess of buttock: Secondary | ICD-10-CM | POA: Insufficient documentation

## 2013-11-12 DIAGNOSIS — Z8619 Personal history of other infectious and parasitic diseases: Secondary | ICD-10-CM | POA: Insufficient documentation

## 2013-11-12 DIAGNOSIS — Z8719 Personal history of other diseases of the digestive system: Secondary | ICD-10-CM | POA: Insufficient documentation

## 2013-11-12 DIAGNOSIS — Z79899 Other long term (current) drug therapy: Secondary | ICD-10-CM | POA: Insufficient documentation

## 2013-11-12 MED ORDER — SULFAMETHOXAZOLE-TRIMETHOPRIM 800-160 MG PO TABS: 2.0000 | ORAL_TABLET | Freq: Two times a day (BID) | ORAL | Status: DC

## 2013-11-12 NOTE — ED Provider Notes (Signed)
Medical screening examination/treatment/procedure(s) were performed by non-physician practitioner and as supervising physician I was immediately available for consultation/collaboration.  EKG Interpretation   None         Layla Maw Shateria Paternostro, DO 11/12/13 2145

## 2013-11-12 NOTE — ED Provider Notes (Signed)
CSN: [id number]     Arrival date & time 11/12/13  1810 History   First MD Initiated Contact with Patient 11/12/13 1813     Chief Complaint  Patient presents with  . Cyst   (Consider location/radiation/quality/duration/timing/severity/associated sxs/prior Treatment) HPI Comments: Patient history of probable pilonidal abscess presents with recurrent symptoms for the past 3 days. Patient has noted increasing pain and swelling just below her tailbone. She is having trouble walking and sitting due to pain. She has had I&D performed in the past. No fever, nausea or vomiting. Patient does not have diabetes or other immunocompromising states. Onset of symptoms gradual. Course is constant. No treatments prior to arrival.  The history is provided by the patient.    Past Medical History  Diagnosis Date  . H/O varicella   . Gallstones   . 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 Anxiety     anxiety disorder   Past Surgical History  Procedure Laterality Date  . Back surgery    . Cholecystectomy    . Cholecystectomy  07/11/2012    Procedure: LAPAROSCOPIC CHOLECYSTECTOMY WITH INTRAOPERATIVE CHOLANGIOGRAM;  Surgeon: Almond Lint, MD;  Location: [facility];  Service: General;  Laterality: N/A;  . Ercp  07/12/2012    Procedure: ENDOSCOPIC RETROGRADE CHOLANGIOPANCREATOGRAPHY (ERCP);  Surgeon: Petra Kuba, MD;  Location: [facility];  Service: Endoscopy;  Laterality: N/A;  . Esophagogastroduodenoscopy  08/01/2012    Procedure: ESOPHAGOGASTRODUODENOSCOPY (EGD);  Surgeon: Florencia Reasons, MD;  Location: [facility];  Service: Endoscopy;  Laterality: N/A;   Family History  Problem Relation Age of Onset  . Hypertension Mother   . Heart disease Maternal Grandfather    History  Substance Use Topics  . Smoking status: Current Every Day Smoker -- 0.50 packs/day for 12 years    Types: Cigarettes  . Smokeless tobacco: Never Used  . Alcohol Use: No   OB History   Grav Para Term Preterm Abortions TAB  SAB Ect Mult Living   3 1 1  2  2   1      Review of Systems  Constitutional: Negative for fever.  Gastrointestinal: Negative for nausea and vomiting.  Skin: Positive for color change.       Positive for abscess.  Hematological: Negative for adenopathy.    Allergies  Questran  Home Medications   Current Outpatient Rx  Name  Route  Sig  Dispense  Refill  . ondansetron (ZOFRAN) 8 MG tablet   Oral   Take 8 mg by mouth every 8 (eight) hours as needed. nausea         . EXPIRED: promethazine (PHENERGAN) 25 MG suppository   Rectal   Place 1 suppository (25 mg total) rectally every 6 (six) hours as needed for nausea.   24 each   0   . EXPIRED: sucralfate (CARAFATE) 1 G tablet   Oral   Take 1 tablet (1 g total) by mouth 4 (four) times daily -  before meals and at bedtime.   120 tablet   1   . sulfamethoxazole-trimethoprim (SEPTRA DS) 800-160 MG per tablet   Oral   Take 2 tablets by mouth every 12 (twelve) hours.   28 tablet   0   . zolpidem (AMBIEN) 10 MG tablet   Oral   Take 10 mg by mouth at bedtime as needed. Sleep.          BP 105/70  Temp(Src) 98.2 F (36.8 C) (Oral)  Resp 18  SpO2 100%  LMP 10/31/2013 Physical  Exam  Nursing note and vitals reviewed. Constitutional: She appears well-developed and well-nourished.  HENT:  Head: Normocephalic and atraumatic.  Eyes: Conjunctivae are normal.  Neck: Normal range of motion. Neck supple.  Pulmonary/Chest: No respiratory distress.  Neurological: She is alert.  Skin: Skin is warm and dry.  3cm diameter area induration superior R buttocks at border of gluteal cleft. There is surrounding erythema and warmth c/w cellulitis with extension to contralateral buttock.   Psychiatric: She has a normal mood and affect.    ED Course  Procedures (including critical care time) Labs Review Labs Reviewed - No data to display Imaging Review No results found.  EKG Interpretation   None      6:44 PM Patient seen and  examined.    Vital signs reviewed and are as follows: Filed Vitals:   11/12/13 1817  BP: 105/70  Temp: 98.2 F (36.8 C)  Resp: 18   INCISION AND DRAINAGE Performed by: Carolee Rota Consent: Verbal consent obtained. Risks and benefits: risks, benefits and alternatives were discussed Type: abscess  Body area: R buttock  Anesthesia: local infiltration  Incision was made with a scalpel.  Local anesthetic: lidocaine 2% with epinephrine  Anesthetic total: 4 ml  Complexity: complex Blunt dissection to break up loculations  Drainage: purulent  Drainage amount: large  Packing material: none  Patient tolerance: Patient tolerated the procedure well with no immediate complications.  6:46 PM Surgery referral given as area is recurrent. ? Pilonidal cyst.   The patient was urged to return to the Emergency Department urgently with worsening pain, swelling, expanding erythema especially if it streaks away from the affected area, fever, or if they have any other concerns.   The patient was urged to return to the Emergency Department or go to their PCP in 48 hours for wound recheck if the area is not significantly improved.  The patient verbalized understanding and stated agreement with this plan.    MDM   1. Abscess and cellulitis    Patient with skin abscess amenable to incision and drainage. There is surrounding cellulitis so antibiotics indicated. Will d/c to home.    Renne Crigler, PA-C 11/12/13 [phone]

## 2013-11-12 NOTE — ED Notes (Signed)
Pt has cyst right below tailbone x 3 days. Pt states she cannot walk or sit on her buttocks. Reoccurring issue.

## 2014-07-01 ENCOUNTER — Emergency Department (HOSPITAL_COMMUNITY): Payer: Medicaid Other

## 2014-07-01 ENCOUNTER — Emergency Department (HOSPITAL_COMMUNITY)
Admission: EM | Admit: 2014-07-01 | Discharge: 2014-07-01 | Disposition: A | Discharge status: Home / Self Care | Payer: Medicaid Other | Attending: Emergency Medicine | Admitting: Emergency Medicine

## 2014-07-01 ENCOUNTER — Encounter (HOSPITAL_COMMUNITY): Payer: Self-pay | Admitting: Emergency Medicine

## 2014-07-01 DIAGNOSIS — R109 Unspecified abdominal pain: Secondary | ICD-10-CM | POA: Insufficient documentation

## 2014-07-01 DIAGNOSIS — N898 Other specified noninflammatory disorders of vagina: Secondary | ICD-10-CM | POA: Insufficient documentation

## 2014-07-01 DIAGNOSIS — F17200 Nicotine dependence, unspecified, uncomplicated: Secondary | ICD-10-CM | POA: Insufficient documentation

## 2014-07-01 DIAGNOSIS — Z79899 Other long term (current) drug therapy: Secondary | ICD-10-CM | POA: Insufficient documentation

## 2014-07-01 DIAGNOSIS — Z3202 Encounter for pregnancy test, result negative: Secondary | ICD-10-CM | POA: Diagnosis not present

## 2014-07-01 DIAGNOSIS — N939 Abnormal uterine and vaginal bleeding, unspecified: Secondary | ICD-10-CM

## 2014-07-01 DIAGNOSIS — Z8619 Personal history of other infectious and parasitic diseases: Secondary | ICD-10-CM | POA: Diagnosis not present

## 2014-07-01 DIAGNOSIS — Z8719 Personal history of other diseases of the digestive system: Secondary | ICD-10-CM | POA: Diagnosis not present

## 2014-07-01 DIAGNOSIS — F411 Generalized anxiety disorder: Secondary | ICD-10-CM | POA: Diagnosis not present

## 2014-07-01 HISTORY — DX: Other intervertebral disc displacement, lumbar region: M51.26

## 2014-07-01 HISTORY — DX: Other intervertebral disc degeneration, lumbar region: M51.36

## 2014-07-01 HISTORY — DX: Other intervertebral disc degeneration, lumbar region without mention of lumbar back pain or lower extremity pain: M51.369

## 2014-07-01 LAB — CBC
HCT: 39.2 % (ref 36.0–46.0)
Hemoglobin: 13.1 g/dL (ref 12.0–15.0)
MCH: 29.4 pg (ref 26.0–34.0)
MCHC: 33.4 g/dL (ref 30.0–36.0)
MCV: 88.1 fL (ref 78.0–100.0)
Platelets: 421 10*3/uL — ABNORMAL HIGH (ref 150–400)
RBC: 4.45 MIL/uL (ref 3.87–5.11)
RDW: 13.8 % (ref 11.5–15.5)
WBC: 12.4 10*3/uL — ABNORMAL HIGH (ref 4.0–10.5)

## 2014-07-01 LAB — BASIC METABOLIC PANEL
Anion gap: 11 (ref 5–15)
BUN: 7 mg/dL (ref 6–23)
CO2: 25 mEq/L (ref 19–32)
Calcium: 9.1 mg/dL (ref 8.4–10.5)
Chloride: 101 mEq/L (ref 96–112)
Creatinine, Ser: 0.82 mg/dL (ref 0.50–1.10)
GFR calc Af Amer: >90 mL/min (ref >90)
GFR calc non Af Amer: >90 mL/min (ref >90)
Glucose, Bld: 97 mg/dL (ref 70–99)
Potassium: 4.3 mEq/L (ref 3.7–5.3)
Sodium: 137 mEq/L (ref 137–147)

## 2014-07-01 LAB — URINALYSIS, ROUTINE W REFLEX MICROSCOPIC
Bilirubin Urine: NEGATIVE
Glucose, UA: NEGATIVE mg/dL
Ketones, ur: NEGATIVE mg/dL
Leukocytes, UA: NEGATIVE
Nitrite: NEGATIVE
Protein, ur: NEGATIVE mg/dL
Specific Gravity, Urine: 1.004 — ABNORMAL LOW (ref 1.005–1.030)
Urobilinogen, UA: 0.2 mg/dL (ref 0.0–1.0)
pH: 6.5 (ref 5.0–8.0)

## 2014-07-01 LAB — WET PREP, GENITAL
Clue Cells Wet Prep HPF POC: NONE SEEN
Trich, Wet Prep: NONE SEEN
WBC, Wet Prep HPF POC: NONE SEEN
Yeast Wet Prep HPF POC: NONE SEEN

## 2014-07-01 LAB — URINE MICROSCOPIC-ADD ON

## 2014-07-01 LAB — PREGNANCY, URINE: Preg Test, Ur: NEGATIVE

## 2014-07-01 MED ORDER — HYDROCODONE-ACETAMINOPHEN 5-325 MG PO TABS: 1.0000 | ORAL_TABLET | Freq: Four times a day (QID) | ORAL | Status: DC | PRN

## 2014-07-01 NOTE — ED Provider Notes (Signed)
CSN: [id number]     Arrival date & time 07/01/14  1403 History   First MD Initiated Contact with Patient 07/01/14 1533     Chief Complaint  Patient presents with  . Vaginal Bleeding  . Abdominal Pain     (Consider location/radiation/quality/duration/timing/severity/associated sxs/prior Treatment) HPI Patient presents to the emergency department with cramping and vaginal bleeding, started this morning.  The patient, states, that her menstrual cycles normally or not, very heavy, but this, time  she's had a fair amount of bleeding.  Patient, states, that the bleeding has considerably improved since this morning, but she still bleeding.  The patient, states, that she did not take any medications prior to arrival.  The patient, states, that nothing seems to make her condition, better or worse.  Patient denies dizziness, headache, blurred vision, shortness of breath, chest pain, dysuria, vaginal discharge, constipation, rash, or syncope.  Patient, states that she was using 4 tampons every hour earlier in the day Past Medical History  Diagnosis Date  . H/O varicella   . Gallstones   . 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 Anxiety     anxiety disorder  . Bulging lumbar disc    Past Surgical History  Procedure Laterality Date  . Back surgery    . Cholecystectomy    . Cholecystectomy  07/11/2012    Procedure: LAPAROSCOPIC CHOLECYSTECTOMY WITH INTRAOPERATIVE CHOLANGIOGRAM;  Surgeon: Almond LintFaera Byerly, MD;  Location: [facility];  Service: General;  Laterality: N/A;  . Ercp  07/12/2012    Procedure: ENDOSCOPIC RETROGRADE CHOLANGIOPANCREATOGRAPHY (ERCP);  Surgeon: Petra KubaMarc E Magod, MD;  [facility];  Service: Endoscopy;  Laterality: N/A;  . Esophagogastroduodenoscopy  08/01/2012    Procedure: ESOPHAGOGASTRODUODENOSCOPY (EGD);  Surgeon: Florencia Reasonsobert V Buccini, MD;  [facility];  Service: Endoscopy;  Laterality: N/A;   Family History  Problem Relation Age of Onset  . Hypertension Mother   .  Heart disease Maternal Grandfather    History  Substance Use Topics  . Smoking status: Current Every Day Smoker -- 0.50 packs/day for 12 years    Types: Cigarettes  . Smokeless tobacco: Never Used  . Alcohol Use: No   OB History   Grav Para Term Preterm Abortions TAB SAB Ect Mult Living   3 1 1  2  2   1      Review of Systems  All other systems negative except as documented in the HPI. All pertinent positives and negatives as reviewed in the HPI.   Allergies  Questran  Home Medications   Prior to Admission medications   Medication Sig Start Date End Date Taking? Authorizing Provider  acetaminophen (TYLENOL) 325 MG tablet Take 650 mg by mouth every 6 (six) hours as needed (pain).   Yes Historical Provider, MD  cyclobenzaprine (FLEXERIL) 5 MG tablet Take 5 mg by mouth 3 (three) times daily as needed for muscle spasms (muscle spasm).   Yes Historical Provider, MD  meloxicam (MOBIC) 7.5 MG tablet Take 7.5 mg by mouth daily.   Yes Historical Provider, MD  promethazine (PHENERGAN) 25 MG suppository Place 1 suppository (25 mg total) rectally every 6 (six) hours as needed for nausea. 07/20/12 07/27/12  Almond LintFaera Byerly, MD  sucralfate (CARAFATE) 1 G tablet Take 1 tablet (1 g total) by mouth 4 (four) times daily -  before meals and at bedtime. 08/02/12 08/02/13  Letha CapeKelly E Osborne, PA-C   BP 92/64  Pulse 65  Temp(Src) 98.7 F (37.1 C) (Oral)  Resp 15  SpO2 97%  LMP  07/01/2014 Physical Exam  Nursing note and vitals reviewed. Constitutional: She is oriented to person, place, and time. She appears well-developed and well-nourished. No distress.  HENT:  Head: Normocephalic and atraumatic.  Mouth/Throat: Oropharynx is clear and moist.  Eyes: Pupils are equal, round, and reactive to light.  Neck: Normal range of motion. Neck supple.  Cardiovascular: Normal rate, regular rhythm and normal heart sounds.  Exam reveals no gallop and no friction rub.   No murmur heard. Pulmonary/Chest: Effort normal  and breath sounds normal. No respiratory distress.  Neurological: She is alert and oriented to person, place, and time. She exhibits normal muscle tone. Coordination normal.  Skin: Skin is warm and dry. No erythema.    ED Course  Procedures (including critical care time) Labs Review Labs Reviewed  CBC - Abnormal; Notable for the following:    WBC 12.4 (*)    Platelets 421 (*)    All other components within normal limits  URINALYSIS, ROUTINE W REFLEX MICROSCOPIC - Abnormal; Notable for the following:    Specific Gravity, Urine 1.004 (*)    Hgb urine dipstick TRACE (*)    All other components within normal limits  WET PREP, GENITAL  GC/CHLAMYDIA PROBE AMP  BASIC METABOLIC PANEL  PREGNANCY, URINE  URINE MICROSCOPIC-ADD ON    Imaging Review US Transvaginal Non-ob  07/01/2014   CLINICAL DATA:  Pelvic pain.  Vaginal bleeding.  EXAM: TRANSABDOMINAL ULTRASOUND OF PELVIS  DOPPLER ULTRASOUND OF OVARIES  TECHNIQUE: Transabdominal ultrasound examination of the pelvis was performed including evaluation of the uterus, ovaries, adnexal regions, and pelvic cul-de-sac.  Color and duplex Doppler ultrasound was utilized to evaluate blood flow to the ovaries. Transvaginal sonography necessary to visualize the endometrium and ovaries.  COMPARISON:  Multiple exams, including 09/21/2012  FINDINGS: Pulsed Doppler evaluation demonstrates normal low-resistance arterial and venous waveforms in both ovaries.  UTERUS  Measurements: 8.6 x 3.9 x 5.0 cm. No fibroids or other mass visualized.  Endometrium  Thickness: 9 mm.  No focal abnormality visualized.  Right ovary  Measurements: 2.4 x 2.9 x 2.3 cm. Normal appearance/no adnexal mass.  Left ovary  Measurements: 2.5 x 1.5 x 2.0 cm. Normal appearance/no adnexal mass.  IMPRESSION: 1. Normal sonographic appearance of the pelvis. No significant ovarian or uterine abnormality observed.   Electronically Signed   By: Herbie Baltimore M.D.   On: 07/01/2014 18:58   US Pelvis  Complete  07/01/2014   CLINICAL DATA:  Pelvic pain.  Vaginal bleeding.  EXAM: TRANSABDOMINAL ULTRASOUND OF PELVIS  DOPPLER ULTRASOUND OF OVARIES  TECHNIQUE: Transabdominal ultrasound examination of the pelvis was performed including evaluation of the uterus, ovaries, adnexal regions, and pelvic cul-de-sac.  Color and duplex Doppler ultrasound was utilized to evaluate blood flow to the ovaries. Transvaginal sonography necessary to visualize the endometrium and ovaries.  COMPARISON:  Multiple exams, including 09/21/2012  FINDINGS: Pulsed Doppler evaluation demonstrates normal low-resistance arterial and venous waveforms in both ovaries.  UTERUS  Measurements: 8.6 x 3.9 x 5.0 cm. No fibroids or other mass visualized.  Endometrium  Thickness: 9 mm.  No focal abnormality visualized.  Right ovary  Measurements: 2.4 x 2.9 x 2.3 cm. Normal appearance/no adnexal mass.  Left ovary  Measurements: 2.5 x 1.5 x 2.0 cm. Normal appearance/no adnexal mass.  IMPRESSION: 1. Normal sonographic appearance of the pelvis. No significant ovarian or uterine abnormality observed.   Electronically Signed   By: Herbie Baltimore M.D.   On: 07/01/2014 18:58   [place] Art/ven Flow Abd Pelv Doppler  07/01/2014   CLINICAL DATA:  Pelvic pain.  Vaginal bleeding.  EXAM: TRANSABDOMINAL ULTRASOUND OF PELVIS  DOPPLER ULTRASOUND OF OVARIES  TECHNIQUE: Transabdominal ultrasound examination of the pelvis was performed including evaluation of the uterus, ovaries, adnexal regions, and pelvic cul-de-sac.  Color and duplex Doppler ultrasound was utilized to evaluate blood flow to the ovaries. Transvaginal sonography necessary to visualize the endometrium and ovaries.  COMPARISON:  Multiple exams, including 09/21/2012  FINDINGS: Pulsed Doppler evaluation demonstrates normal low-resistance arterial and venous waveforms in both ovaries.  UTERUS  Measurements: 8.6 x 3.9 x 5.0 cm. No fibroids or other mass visualized.  Endometrium  Thickness: 9 mm.  No focal  abnormality visualized.  Right ovary  Measurements: 2.4 x 2.9 x 2.3 cm. Normal appearance/no adnexal mass.  Left ovary  Measurements: 2.5 x 1.5 x 2.0 cm. Normal appearance/no adnexal mass.  IMPRESSION: 1. Normal sonographic appearance of the pelvis. No significant ovarian or uterine abnormality observed.   Electronically Signed   By: Herbie Baltimore M.D.   On: 07/01/2014 18:58     Patient is advised of her results, and, advised she'll need followup with GYN.  Patient Ms. likely.  She is having a heavier than normal.  She is advised to return here as needed.  Testing here today.  Was reviewed and she was given pain control.  Patient's bleeding has subsided substantially since this morning.  Her hemoglobin and Vital signs have been stable  Carlyle Dolly, PA-C 07/02/14 0140

## 2014-07-01 NOTE — Discharge Instructions (Signed)
Your testing here today, was normal.  Followup with the GYN provided

## 2014-07-01 NOTE — ED Notes (Addendum)
Pelvic cart set up at bedside, Patient tried to get urine sample and was unsuccessful. Will retry a little later.

## 2014-07-01 NOTE — ED Notes (Signed)
Pt c/o lower abdominal pain/cramping and excessive vaginal bleeding starting this morning.  Pain score 8/10.  Pt reports that menstrual cycles are normally light and this is very abnormal for her.  Pt reports that she could possibly be pregnant.

## 2014-07-02 LAB — GC/CHLAMYDIA PROBE AMP
CT Probe RNA: NEGATIVE
GC Probe RNA: NEGATIVE

## 2014-07-05 NOTE — ED Provider Notes (Signed)
Medical screening examination/treatment/procedure(s) were performed by non-physician practitioner and as supervising physician I was immediately available for consultation/collaboration.   EKG Interpretation None       Mikailah Morel, MD 07/05/14 1321 

## 2014-07-18 ENCOUNTER — Telehealth: Payer: Self-pay | Admitting: Internal Medicine

## 2014-07-18 NOTE — Telephone Encounter (Signed)
Spoke with Crystal Acosta, she stated that pt [place] not [place] med[place], to show insurance card when come in. Dr. Jonny RuizJohn might not see her if she has mcd [place] access. Appt ist set.

## 2014-07-18 NOTE — Telephone Encounter (Signed)
[name] mother call request for Crystal Acosta to reestablish with Dr. Jonny Acosta. Last ov was 12/2010. Pt is experiencing jaw issue, pt had her wisdom teeth out about 3-4 month ago. Please advise, need appt ASAP.

## 2014-07-18 NOTE — Telephone Encounter (Signed)
Ok for next available?

## 2014-07-25 ENCOUNTER — Ambulatory Visit: Payer: Self-pay | Admitting: Internal Medicine

## 2014-09-20 ENCOUNTER — Telehealth: Payer: Self-pay | Admitting: Vascular Surgery

## 2014-09-20 ENCOUNTER — Other Ambulatory Visit: Payer: Self-pay | Admitting: Neurosurgery

## 2014-09-20 NOTE — Telephone Encounter (Addendum)
Message copied by Fredrich BirksMILLIKAN, DANA P on Thu Sep 20, 2014  2:16 PM ------      Message from: Phillips OdorPULLINS, CAROL S      Created: Thu Sep 20, 2014 12:46 PM      Regarding: needs new pt. consult       Please schedule pt. for new pt. Consult with Dr. Arbie CookeyEarly, prior to ALIF on 10/10/14.  Please have pt. Bring copy of LS spine film to appt. with her.  ------  09/20/14 @ 2:17pm: left message for patient to call for appointment information, dpm

## 2014-09-28 ENCOUNTER — Other Ambulatory Visit: Payer: Self-pay

## 2014-10-01 ENCOUNTER — Encounter (HOSPITAL_COMMUNITY): Payer: Self-pay | Admitting: Pharmacy Technician

## 2014-10-04 ENCOUNTER — Inpatient Hospital Stay (HOSPITAL_COMMUNITY)
Admission: RE | Admit: 2014-10-04 | Discharge: 2014-10-04 | Disposition: A | Discharge status: Home / Self Care | Payer: Medicaid Other | Source: Ambulatory Visit

## 2014-10-04 NOTE — Pre-Procedure Instructions (Signed)
Crystal SamsBrean D Acosta  10/04/2014   Your procedure is scheduled on:  Wednesday, October 10, 2014  [facility] at 6:30 AM.  Call this number if you have problems the morning of surgery: [phone]   Remember:   Do not eat food or drink liquids after midnight Tuesday, October 09, 2014   Take these medicines the morning of surgery with A SIP OF WATER: DULoxetine (CYMBALTA), if needed: diazepam (VALIUM) for anxiety, oxyCODONE for severe pain  Stop taking Aspirin, vitamins, and herbal medications. Do not take any NSAIDs ie: Ibuprofen, Advil, Naproxen or any medication containing Aspirin such as  meloxicam (MOBIC); stop 5 days prior to procedure ( Friday, October 05, 2014)   Do not wear jewelry, make-up or nail polish.  Do not wear lotions, powders, or perfumes. You may not wear deodorant.  Do not shave 48 hours prior to surgery.   Do not bring valuables to the hospital.  [facility] is not responsible for any belongings or valuables.               Contacts, dentures or bridgework may not be worn into surgery.  Leave suitcase in the car. After surgery it may be brought to your room.  For patients admitted to the hospital, discharge time is determined by your treatment team.               Patients discharged the day of surgery will not be allowed to drive home.  Name and phone number of your driver:   Special Instructions:  Special Instructions:Special Instructions: [facility] - Preparing for [facility]  Before surgery, you can play an important role.  Because skin is not sterile, your skin needs to be as free of germs as possible.  You can reduce the number of germs on you skin by washing with CHG (chlorahexidine gluconate) soap before surgery.  CHG is an antiseptic cleaner which kills germs and bonds with the skin to continue killing germs even after washing.  Please DO NOT use if you have an allergy to CHG or antibacterial soaps.  If your skin becomes  reddened/irritated stop using the CHG and inform your nurse when you arrive at Short Stay.  Do not shave (including legs and underarms) for at least 48 hours prior to the first CHG shower.  You may shave your face.  Please follow these instructions carefully:   1.  Shower with CHG Soap the night before surgery and the morning of Surgery.  2.  If you choose to wash your hair, wash your hair first as usual with your normal shampoo.  3.  After you shampoo, rinse your hair and body thoroughly to remove the Shampoo.  4.  Use CHG as you would any other liquid soap.  You can apply chg directly  to the skin and wash gently with scrungie or a clean washcloth.  5.  Apply the CHG Soap to your body ONLY FROM THE NECK DOWN.  Do not use on open wounds or open sores.  Avoid contact with your eyes, ears, mouth and genitals (private parts).  Wash genitals (private parts) with your normal soap.  6.  Wash thoroughly, paying special attention to the area where your surgery will be performed.  7.  Thoroughly rinse your body with warm water from the neck down.  8.  DO NOT shower/wash with your normal soap after using and rinsing off the CHG Soap.  9.  Pat yourself dry with a clean  towel.            10.  Wear clean pajamas.            11.  Place clean sheets on your bed the night of your first shower and do not sleep with pets.  Day of Surgery  Do not apply any lotions/deodorants the morning of surgery.  Please wear clean clothes to the hospital/surgery center.   Please read over the following fact sheets that you were given: Pain Booklet, Coughing and Deep Breathing, MRSA Information and Surgical Site Infection Prevention

## 2014-10-08 ENCOUNTER — Encounter (HOSPITAL_COMMUNITY): Payer: Self-pay

## 2014-10-08 ENCOUNTER — Encounter: Payer: Self-pay | Admitting: Vascular Surgery

## 2014-10-08 ENCOUNTER — Encounter (HOSPITAL_COMMUNITY)
Admission: RE | Admit: 2014-10-08 | Discharge: 2014-10-08 | Disposition: A | Discharge status: Home / Self Care | Payer: Medicaid Other | Source: Ambulatory Visit | Attending: Neurosurgery | Admitting: Neurosurgery

## 2014-10-08 ENCOUNTER — Other Ambulatory Visit: Payer: Self-pay

## 2014-10-08 LAB — BASIC METABOLIC PANEL
ANION GAP: 14 (ref 5–15)
BUN: 8 mg/dL (ref 6–23)
CALCIUM: 9.7 mg/dL (ref 8.4–10.5)
CO2: 24 mEq/L (ref 19–32)
CREATININE: 0.74 mg/dL (ref 0.50–1.10)
Chloride: 102 mEq/L (ref 96–112)
Glucose, Bld: 105 mg/dL — ABNORMAL HIGH (ref 70–99)
Potassium: 3.9 mEq/L (ref 3.7–5.3)
Sodium: 140 mEq/L (ref 137–147)

## 2014-10-08 LAB — HCG, SERUM, QUALITATIVE: Preg, Serum: NEGATIVE

## 2014-10-08 LAB — TYPE AND SCREEN
ABO/RH(D): O POS
ANTIBODY SCREEN: NEGATIVE

## 2014-10-08 LAB — CBC
HCT: 41.1 % (ref 36.0–46.0)
Hemoglobin: 13.9 g/dL (ref 12.0–15.0)
MCH: 29.9 pg (ref 26.0–34.0)
MCHC: 33.8 g/dL (ref 30.0–36.0)
MCV: 88.4 fL (ref 78.0–100.0)
PLATELETS: 478 10*3/uL — AB (ref 150–400)
RBC: 4.65 MIL/uL (ref 3.87–5.11)
RDW: 14.1 % (ref 11.5–15.5)
WBC: 8.4 10*3/uL (ref 4.0–10.5)

## 2014-10-08 LAB — ABO/RH: ABO/RH(D): O POS

## 2014-10-08 LAB — SURGICAL PCR SCREEN
MRSA, PCR: NEGATIVE
Staphylococcus aureus: NEGATIVE

## 2014-10-09 ENCOUNTER — Encounter: Payer: Medicaid Other | Admitting: Vascular Surgery

## 2014-10-09 MED ORDER — DEXAMETHASONE SODIUM PHOSPHATE 10 MG/ML IJ SOLN
10.0000 mg | INTRAMUSCULAR | Status: DC
  Filled 2014-10-09: qty 1

## 2014-10-09 MED ORDER — CEFAZOLIN SODIUM-DEXTROSE 2-3 GM-% IV SOLR
2.0000 g | INTRAVENOUS | Status: AC
  Administered 2014-10-10: 2 g via INTRAVENOUS
  Filled 2014-10-09: qty 50

## 2014-10-10 ENCOUNTER — Inpatient Hospital Stay (HOSPITAL_COMMUNITY): Payer: Medicaid Other

## 2014-10-10 ENCOUNTER — Encounter (HOSPITAL_COMMUNITY): Payer: Self-pay | Admitting: *Deleted

## 2014-10-10 ENCOUNTER — Inpatient Hospital Stay (HOSPITAL_COMMUNITY): Payer: Medicaid Other | Admitting: Certified Registered"

## 2014-10-10 ENCOUNTER — Encounter (HOSPITAL_COMMUNITY): Payer: Medicaid Other | Admitting: Certified Registered"

## 2014-10-10 ENCOUNTER — Inpatient Hospital Stay (HOSPITAL_COMMUNITY)
Admission: RE | Admit: 2014-10-10 | Discharge: 2014-10-11 | DRG: 460 | Disposition: A | Discharge status: Home / Self Care | Payer: Medicaid Other | Source: Ambulatory Visit | Attending: Neurosurgery | Admitting: Neurosurgery

## 2014-10-10 ENCOUNTER — Encounter (HOSPITAL_COMMUNITY)
Admission: RE | Disposition: A | Discharge status: Home / Self Care | Payer: Self-pay | Source: Ambulatory Visit | Attending: Neurosurgery

## 2014-10-10 DIAGNOSIS — Z981 Arthrodesis status: Secondary | ICD-10-CM

## 2014-10-10 DIAGNOSIS — F17210 Nicotine dependence, cigarettes, uncomplicated: Secondary | ICD-10-CM | POA: Diagnosis not present

## 2014-10-10 DIAGNOSIS — M5116 Intervertebral disc disorders with radiculopathy, lumbar region: Secondary | ICD-10-CM | POA: Diagnosis not present

## 2014-10-10 DIAGNOSIS — M4806 Spinal stenosis, lumbar region: Secondary | ICD-10-CM | POA: Diagnosis not present

## 2014-10-10 DIAGNOSIS — M549 Dorsalgia, unspecified: Secondary | ICD-10-CM | POA: Diagnosis present

## 2014-10-10 DIAGNOSIS — F419 Anxiety disorder, unspecified: Secondary | ICD-10-CM | POA: Diagnosis present

## 2014-10-10 DIAGNOSIS — M4807 Spinal stenosis, lumbosacral region: Secondary | ICD-10-CM | POA: Diagnosis not present

## 2014-10-10 DIAGNOSIS — M5137 Other intervertebral disc degeneration, lumbosacral region: Secondary | ICD-10-CM | POA: Diagnosis present

## 2014-10-10 DIAGNOSIS — IMO0002 Reserved for concepts with insufficient information to code with codable children: Secondary | ICD-10-CM

## 2014-10-10 HISTORY — PX: ANTERIOR LUMBAR FUSION: SHX1170

## 2014-10-10 HISTORY — PX: ABDOMINAL EXPOSURE: SHX5708

## 2014-10-10 SURGERY — ANTERIOR LUMBAR FUSION 1 LEVEL
Anesthesia: General | Site: Abdomen

## 2014-10-10 MED ORDER — GLYCOPYRROLATE 0.2 MG/ML IJ SOLN
INTRAMUSCULAR | Status: AC
  Filled 2014-10-10: qty 3

## 2014-10-10 MED ORDER — HYDROMORPHONE HCL 1 MG/ML IJ SOLN
0.5000 mg | INTRAMUSCULAR | Status: DC | PRN
  Administered 2014-10-10 (×2): 1 mg via INTRAVENOUS
  Filled 2014-10-10 (×2): qty 1

## 2014-10-10 MED ORDER — MIDAZOLAM HCL 5 MG/5ML IJ SOLN
INTRAMUSCULAR | Status: DC | PRN
  Administered 2014-10-10: 2 mg via INTRAVENOUS

## 2014-10-10 MED ORDER — KCL IN DEXTROSE-NACL 20-5-0.9 MEQ/L-%-% IV SOLN
INTRAVENOUS | Status: DC
  Filled 2014-10-10 (×3): qty 1000

## 2014-10-10 MED ORDER — ONDANSETRON HCL 4 MG/2ML IJ SOLN
INTRAMUSCULAR | Status: AC
  Filled 2014-10-10: qty 2

## 2014-10-10 MED ORDER — PROPOFOL 10 MG/ML IV BOLUS
INTRAVENOUS | Status: AC
  Filled 2014-10-10: qty 20

## 2014-10-10 MED ORDER — HYDROMORPHONE HCL 1 MG/ML IJ SOLN: 0.2500 mg | INTRAMUSCULAR | Status: DC | PRN

## 2014-10-10 MED ORDER — OXYCODONE HCL 5 MG/5ML PO SOLN: 5.0000 mg | Freq: Once | ORAL | Status: DC | PRN

## 2014-10-10 MED ORDER — NEOSTIGMINE METHYLSULFATE 10 MG/10ML IV SOLN
INTRAVENOUS | Status: AC
  Filled 2014-10-10: qty 1

## 2014-10-10 MED ORDER — MIDAZOLAM HCL 2 MG/2ML IJ SOLN
0.5000 mg | Freq: Once | INTRAMUSCULAR | Status: AC
  Administered 2014-10-10: 0.5 mg via INTRAVENOUS

## 2014-10-10 MED ORDER — DEXAMETHASONE SODIUM PHOSPHATE 10 MG/ML IJ SOLN
INTRAMUSCULAR | Status: DC | PRN
  Administered 2014-10-10: 10 mg via INTRAVENOUS

## 2014-10-10 MED ORDER — CYCLOBENZAPRINE HCL 10 MG PO TABS
10.0000 mg | ORAL_TABLET | Freq: Three times a day (TID) | ORAL | Status: DC | PRN
  Administered 2014-10-10 – 2014-10-11 (×2): 10 mg via ORAL
  Filled 2014-10-10 (×3): qty 1

## 2014-10-10 MED ORDER — TRAZODONE HCL 100 MG PO TABS
100.0000 mg | ORAL_TABLET | Freq: Every day | ORAL | Status: DC
  Administered 2014-10-10: 100 mg via ORAL
  Filled 2014-10-10 (×2): qty 1

## 2014-10-10 MED ORDER — DIAZEPAM 5 MG PO TABS
5.0000 mg | ORAL_TABLET | Freq: Two times a day (BID) | ORAL | Status: DC | PRN
  Administered 2014-10-10: 5 mg via ORAL

## 2014-10-10 MED ORDER — ACETAMINOPHEN 650 MG RE SUPP: 650.0000 mg | RECTAL | Status: DC | PRN

## 2014-10-10 MED ORDER — GLYCOPYRROLATE 0.2 MG/ML IJ SOLN
INTRAMUSCULAR | Status: DC | PRN
  Administered 2014-10-10: 0.6 mg via INTRAVENOUS

## 2014-10-10 MED ORDER — LIDOCAINE HCL (CARDIAC) 20 MG/ML IV SOLN
INTRAVENOUS | Status: AC
  Filled 2014-10-10: qty 5

## 2014-10-10 MED ORDER — FENTANYL CITRATE 0.05 MG/ML IJ SOLN
INTRAMUSCULAR | Status: DC | PRN
  Administered 2014-10-10 (×5): 50 ug via INTRAVENOUS
  Administered 2014-10-10: 250 ug via INTRAVENOUS
  Administered 2014-10-10: 100 ug via INTRAVENOUS

## 2014-10-10 MED ORDER — LIDOCAINE HCL (CARDIAC) 20 MG/ML IV SOLN
INTRAVENOUS | Status: DC | PRN
  Administered 2014-10-10: 80 mg via INTRAVENOUS

## 2014-10-10 MED ORDER — PROMETHAZINE HCL 25 MG/ML IJ SOLN: 6.2500 mg | INTRAMUSCULAR | Status: DC | PRN

## 2014-10-10 MED ORDER — OXYCODONE HCL 5 MG PO TABS
ORAL_TABLET | ORAL | Status: AC
  Filled 2014-10-10: qty 1

## 2014-10-10 MED ORDER — OXYCODONE HCL 5 MG/5ML PO SOLN: 5.0000 mg | Freq: Once | ORAL | Status: AC | PRN

## 2014-10-10 MED ORDER — ONDANSETRON HCL 4 MG/2ML IJ SOLN: 4.0000 mg | INTRAMUSCULAR | Status: DC | PRN

## 2014-10-10 MED ORDER — ROCURONIUM BROMIDE 100 MG/10ML IV SOLN
INTRAVENOUS | Status: DC | PRN
  Administered 2014-10-10: 30 mg via INTRAVENOUS
  Administered 2014-10-10 (×2): 20 mg via INTRAVENOUS
  Administered 2014-10-10: 50 mg via INTRAVENOUS
  Administered 2014-10-10: 30 mg via INTRAVENOUS

## 2014-10-10 MED ORDER — SUCCINYLCHOLINE CHLORIDE 20 MG/ML IJ SOLN
INTRAMUSCULAR | Status: AC
  Filled 2014-10-10: qty 1

## 2014-10-10 MED ORDER — ACETAMINOPHEN 325 MG PO TABS: 650.0000 mg | ORAL_TABLET | ORAL | Status: DC | PRN

## 2014-10-10 MED ORDER — PHENYLEPHRINE 40 MCG/ML (10ML) SYRINGE FOR IV PUSH (FOR BLOOD PRESSURE SUPPORT)
PREFILLED_SYRINGE | INTRAVENOUS | Status: AC
  Filled 2014-10-10: qty 10

## 2014-10-10 MED ORDER — SODIUM CHLORIDE 0.9 % IJ SOLN: 3.0000 mL | INTRAMUSCULAR | Status: DC | PRN

## 2014-10-10 MED ORDER — THROMBIN 20000 UNITS EX SOLR
CUTANEOUS | Status: DC | PRN
  Administered 2014-10-10: 10:00:00 via TOPICAL

## 2014-10-10 MED ORDER — DOCUSATE SODIUM 100 MG PO CAPS
100.0000 mg | ORAL_CAPSULE | Freq: Two times a day (BID) | ORAL | Status: DC
  Administered 2014-10-10: 100 mg via ORAL
  Filled 2014-10-10 (×3): qty 1

## 2014-10-10 MED ORDER — MIDAZOLAM HCL 2 MG/2ML IJ SOLN: 0.5000 mg | Freq: Once | INTRAMUSCULAR | Status: DC | PRN

## 2014-10-10 MED ORDER — MIDAZOLAM HCL 2 MG/2ML IJ SOLN
INTRAMUSCULAR | Status: AC
  Filled 2014-10-10: qty 2

## 2014-10-10 MED ORDER — SODIUM CHLORIDE 0.9 % IR SOLN
Status: DC | PRN
  Administered 2014-10-10: 10:00:00

## 2014-10-10 MED ORDER — SODIUM CHLORIDE 0.9 % IJ SOLN
3.0000 mL | Freq: Two times a day (BID) | INTRAMUSCULAR | Status: DC
  Administered 2014-10-10 (×2): 3 mL via INTRAVENOUS

## 2014-10-10 MED ORDER — CHLORHEXIDINE GLUCONATE 4 % EX LIQD
60.0000 mL | Freq: Once | CUTANEOUS | Status: DC
  Filled 2014-10-10: qty 60

## 2014-10-10 MED ORDER — OXYCODONE-ACETAMINOPHEN 5-325 MG PO TABS
1.0000 | ORAL_TABLET | ORAL | Status: DC | PRN
  Administered 2014-10-10 – 2014-10-11 (×4): 2 via ORAL
  Filled 2014-10-10 (×4): qty 2

## 2014-10-10 MED ORDER — HYDROMORPHONE HCL 1 MG/ML IJ SOLN
INTRAMUSCULAR | Status: AC
  Filled 2014-10-10: qty 1

## 2014-10-10 MED ORDER — SENNA 8.6 MG PO TABS
1.0000 | ORAL_TABLET | Freq: Two times a day (BID) | ORAL | Status: DC
  Administered 2014-10-10: 8.6 mg via ORAL
  Filled 2014-10-10 (×3): qty 1

## 2014-10-10 MED ORDER — HYDROMORPHONE HCL 1 MG/ML IJ SOLN
0.2500 mg | INTRAMUSCULAR | Status: DC | PRN
  Administered 2014-10-10 (×5): 0.5 mg via INTRAVENOUS

## 2014-10-10 MED ORDER — OXYCODONE HCL 5 MG PO TABS
5.0000 mg | ORAL_TABLET | Freq: Once | ORAL | Status: AC | PRN
  Administered 2014-10-10: 5 mg via ORAL

## 2014-10-10 MED ORDER — OXYCODONE HCL 5 MG PO TABS: 5.0000 mg | ORAL_TABLET | Freq: Once | ORAL | Status: DC | PRN

## 2014-10-10 MED ORDER — DIAZEPAM 5 MG PO TABS
ORAL_TABLET | ORAL | Status: AC
  Filled 2014-10-10: qty 1

## 2014-10-10 MED ORDER — PROPOFOL 10 MG/ML IV BOLUS
INTRAVENOUS | Status: DC | PRN
  Administered 2014-10-10: 200 mg via INTRAVENOUS

## 2014-10-10 MED ORDER — MEPERIDINE HCL 25 MG/ML IJ SOLN: 6.2500 mg | INTRAMUSCULAR | Status: DC | PRN

## 2014-10-10 MED ORDER — LACTATED RINGERS IV SOLN
INTRAVENOUS | Status: DC | PRN
  Administered 2014-10-10: 08:00:00 via INTRAVENOUS

## 2014-10-10 MED ORDER — FENTANYL CITRATE 0.05 MG/ML IJ SOLN
INTRAMUSCULAR | Status: AC
  Filled 2014-10-10: qty 5

## 2014-10-10 MED ORDER — FENTANYL CITRATE 0.05 MG/ML IJ SOLN
INTRAMUSCULAR | Status: AC
Start: 2014-10-10 — End: 2014-10-10
  Filled 2014-10-10: qty 5

## 2014-10-10 MED ORDER — MIDAZOLAM HCL 2 MG/2ML IJ SOLN
INTRAMUSCULAR | Status: AC
  Administered 2014-10-10: 0.5 mg
  Filled 2014-10-10: qty 2

## 2014-10-10 MED ORDER — OXYCODONE-ACETAMINOPHEN 5-325 MG PO TABS: 1.0000 | ORAL_TABLET | Freq: Four times a day (QID) | ORAL | Status: DC | PRN

## 2014-10-10 MED ORDER — MENTHOL 3 MG MT LOZG: 1.0000 | LOZENGE | OROMUCOSAL | Status: DC | PRN

## 2014-10-10 MED ORDER — ONDANSETRON HCL 4 MG/2ML IJ SOLN
INTRAMUSCULAR | Status: DC | PRN
  Administered 2014-10-10: 4 mg via INTRAVENOUS

## 2014-10-10 MED ORDER — 0.9 % SODIUM CHLORIDE (POUR BTL) OPTIME
TOPICAL | Status: DC | PRN
  Administered 2014-10-10: 1000 mL

## 2014-10-10 MED ORDER — DULOXETINE HCL 30 MG PO CPEP
30.0000 mg | ORAL_CAPSULE | Freq: Two times a day (BID) | ORAL | Status: DC
  Administered 2014-10-10: 30 mg via ORAL
  Filled 2014-10-10 (×3): qty 1

## 2014-10-10 MED ORDER — NEOSTIGMINE METHYLSULFATE 10 MG/10ML IV SOLN
INTRAVENOUS | Status: DC | PRN
  Administered 2014-10-10: 4 mg via INTRAVENOUS

## 2014-10-10 MED ORDER — ROCURONIUM BROMIDE 50 MG/5ML IV SOLN
INTRAVENOUS | Status: AC
  Filled 2014-10-10: qty 1

## 2014-10-10 MED ORDER — DIPHENHYDRAMINE HCL 50 MG/ML IJ SOLN: 10.0000 mg | Freq: Once | INTRAMUSCULAR | Status: DC

## 2014-10-10 MED ORDER — PHENOL 1.4 % MT LIQD: 1.0000 | OROMUCOSAL | Status: DC | PRN

## 2014-10-10 MED ORDER — CEFAZOLIN SODIUM 1-5 GM-% IV SOLN
1.0000 g | Freq: Three times a day (TID) | INTRAVENOUS | Status: DC
  Administered 2014-10-10 (×2): 1 g via INTRAVENOUS
  Filled 2014-10-10 (×4): qty 50

## 2014-10-10 MED ORDER — SODIUM CHLORIDE 0.9 % IJ SOLN
INTRAMUSCULAR | Status: AC
  Filled 2014-10-10: qty 3

## 2014-10-10 SURGICAL SUPPLY — 87 items
APPLIER CLIP 11 MED OPEN (CLIP) ×3
BAG DECANTER FOR FLEXI CONT (MISCELLANEOUS) ×3 IMPLANT
BENZOIN TINCTURE PRP APPL 2/3 (GAUZE/BANDAGES/DRESSINGS) IMPLANT
BUR MATCHSTICK NEURO 3.0 LAGG (BURR) IMPLANT
CANISTER SUCT 3000ML (MISCELLANEOUS) ×3 IMPLANT
CLIP APPLIE 11 MED OPEN (CLIP) ×1 IMPLANT
CLOSURE WOUND 1/2 X4 (GAUZE/BANDAGES/DRESSINGS)
CONT SPEC 4OZ CLIKSEAL STRL BL (MISCELLANEOUS) ×3 IMPLANT
COVER BACK TABLE 24X17X13 BIG (DRAPES) IMPLANT
DERMABOND ADHESIVE PROPEN (GAUZE/BANDAGES/DRESSINGS) ×2
DERMABOND ADVANCED .7 DNX6 (GAUZE/BANDAGES/DRESSINGS) ×1 IMPLANT
DRAPE C-ARM 42X72 X-RAY (DRAPES) ×9 IMPLANT
DRAPE INCISE IOBAN 66X45 STRL (DRAPES) IMPLANT
DRAPE LAPAROTOMY 100X72X124 (DRAPES) ×3 IMPLANT
DRSG OPSITE 4X5.5 SM (GAUZE/BANDAGES/DRESSINGS) ×6 IMPLANT
DRSG TELFA 3X8 NADH (GAUZE/BANDAGES/DRESSINGS) ×3 IMPLANT
ELECT BLADE 4.0 EZ CLEAN MEGAD (MISCELLANEOUS)
ELECT BLADE 6.5 EXT (BLADE) ×3 IMPLANT
ELECT REM PT RETURN 9FT ADLT (ELECTROSURGICAL) ×3
ELECTRODE BLDE 4.0 EZ CLN MEGD (MISCELLANEOUS) IMPLANT
ELECTRODE REM PT RTRN 9FT ADLT (ELECTROSURGICAL) ×1 IMPLANT
GAUZE SPONGE 4X4 12PLY STRL (GAUZE/BANDAGES/DRESSINGS) ×3 IMPLANT
GAUZE SPONGE 4X4 16PLY XRAY LF (GAUZE/BANDAGES/DRESSINGS) IMPLANT
GLOVE BIO SURGEON STRL SZ8 (GLOVE) ×3 IMPLANT
GLOVE EXAM NITRILE LRG STRL (GLOVE) IMPLANT
GLOVE EXAM NITRILE MD LF STRL (GLOVE) IMPLANT
GLOVE EXAM NITRILE XL STR (GLOVE) IMPLANT
GLOVE EXAM NITRILE XS STR PU (GLOVE) IMPLANT
GLOVE INDICATOR 8.5 STRL (GLOVE) ×3 IMPLANT
GLOVE OPTIFIT SS 7.0 STRL BRWN (GLOVE) ×1
GLOVE OPTIFIT SS 7.5 STRL LX (GLOVE) ×2 IMPLANT
GLOVE SS BIOGEL STRL SZ 7.5 (GLOVE) ×1 IMPLANT
GLOVE SUPERSENSE BIOGEL SZ 7.5 (GLOVE) ×2
GOWN STRL NON-REIN LRG LVL3 (GOWN DISPOSABLE) ×3 IMPLANT
GOWN STRL REUS W/ TWL LRG LVL3 (GOWN DISPOSABLE) ×2 IMPLANT
GOWN STRL REUS W/ TWL XL LVL3 (GOWN DISPOSABLE) ×1 IMPLANT
GOWN STRL REUS W/TWL 2XL LVL3 (GOWN DISPOSABLE) IMPLANT
GOWN STRL REUS W/TWL LRG LVL3 (GOWN DISPOSABLE) ×4
GOWN STRL REUS W/TWL XL LVL3 (GOWN DISPOSABLE) ×2
INSERT FOGARTY 61MM (MISCELLANEOUS) IMPLANT
INSERT FOGARTY SM (MISCELLANEOUS) IMPLANT
KIT BASIN OR (CUSTOM PROCEDURE TRAY) ×3 IMPLANT
KIT INFUSE XX SMALL 0.7CC (Orthopedic Implant) ×3 IMPLANT
KIT ROOM TURNOVER OR (KITS) ×3 IMPLANT
LIQUID BAND (GAUZE/BANDAGES/DRESSINGS) IMPLANT
LOOP VESSEL MAXI BLUE (MISCELLANEOUS) IMPLANT
LOOP VESSEL MINI RED (MISCELLANEOUS) IMPLANT
NEEDLE SPNL 18GX3.5 QUINCKE PK (NEEDLE) ×3 IMPLANT
NS IRRIG 1000ML POUR BTL (IV SOLUTION) ×3 IMPLANT
PACK LAMINECTOMY NEURO (CUSTOM PROCEDURE TRAY) ×3 IMPLANT
PAD ARMBOARD 7.5X6 YLW CONV (MISCELLANEOUS) ×6 IMPLANT
PUTTY BONE DBX 5CC MIX (Putty) ×3 IMPLANT
SCREW VARIABLE 30MM (Screw) ×3 IMPLANT
SPACER INDEPENCE LG 15X13MM (Spacer) ×3 IMPLANT
SPONGE INTESTINAL PEANUT (DISPOSABLE) ×6 IMPLANT
SPONGE LAP 18X18 X RAY DECT (DISPOSABLE) IMPLANT
SPONGE LAP 4X18 X RAY DECT (DISPOSABLE) IMPLANT
SPONGE SURGIFOAM ABS GEL 100 (HEMOSTASIS) ×6 IMPLANT
STAPLER VISISTAT 35W (STAPLE) IMPLANT
STRIP CLOSURE SKIN 1/2X4 (GAUZE/BANDAGES/DRESSINGS) IMPLANT
SUT MNCRL AB 4-0 PS2 18 (SUTURE) IMPLANT
SUT PROLENE 4 0 RB 1 (SUTURE)
SUT PROLENE 4-0 RB1 .5 CRCL 36 (SUTURE) IMPLANT
SUT PROLENE 5 0 CC1 (SUTURE) IMPLANT
SUT PROLENE 6 0 C 1 30 (SUTURE) IMPLANT
SUT PROLENE 6 0 CC (SUTURE) IMPLANT
SUT SILK 0 TIES 10X30 (SUTURE) ×3 IMPLANT
SUT SILK 2 0 TIES 10X30 (SUTURE) ×3 IMPLANT
SUT SILK 2 0SH CR/8 30 (SUTURE) IMPLANT
SUT SILK 3 0 SH CR/8 (SUTURE) IMPLANT
SUT SILK 3 0 TIES 10X30 (SUTURE) IMPLANT
SUT SILK 3 0SH CR/8 30 (SUTURE) IMPLANT
SUT VIC AB 0 CT1 18XCR BRD8 (SUTURE) IMPLANT
SUT VIC AB 0 CT1 27 (SUTURE)
SUT VIC AB 0 CT1 27XBRD ANBCTR (SUTURE) IMPLANT
SUT VIC AB 0 CT1 27XBRD ANTBC (SUTURE) IMPLANT
SUT VIC AB 0 CT1 8-18 (SUTURE)
SUT VIC AB 2-0 CT1 18 (SUTURE) ×6 IMPLANT
SUT VIC AB 2-0 CT1 36 (SUTURE) IMPLANT
SUT VIC AB 3-0 SH 27 (SUTURE)
SUT VIC AB 3-0 SH 27X BRD (SUTURE) IMPLANT
SUT VICRYL 4-0 PS2 18IN ABS (SUTURE) ×3 IMPLANT
SYR 20ML ECCENTRIC (SYRINGE) ×3 IMPLANT
TOWEL OR 17X24 6PK STRL BLUE (TOWEL DISPOSABLE) ×3 IMPLANT
TOWEL OR 17X26 10 PK STRL BLUE (TOWEL DISPOSABLE) ×3 IMPLANT
TRAY FOLEY CATH 14FRSI W/METER (CATHETERS) ×3 IMPLANT
WATER STERILE IRR 1000ML POUR (IV SOLUTION) ×3 IMPLANT

## 2014-10-10 NOTE — Progress Notes (Signed)
Pt still 6-8/10 pain after Dilaudid, Valium and Oxy IR. Dr Jean RosenthalJackson here and updated. New order for IV Versed. Will cont to monitor closely.

## 2014-10-10 NOTE — Progress Notes (Signed)
Report to E. Watkins RN as primary caregiver. 

## 2014-10-10 NOTE — Evaluation (Signed)
Occupational Therapy Evaluation Patient Details Name: Crystal Acosta MRN: [id number] DOB: 06/24/1981 Today's Date: 10/10/2014    History of Present Illness 33 y.o. s/p ANTERIOR LUMBAR FUSION 1 LEVEL (N/A) - ANTERIOR LUMBAR FUSION 1 LEVEL LUMBAR 5-SACRAL 1.   Clinical Impression   Pt s/p above. Pt independent with ADLs, PTA. Feel pt will benefit from acute OT to increase independence with ADLs, PTA. Plan to practice with AE tomorrow and practice tub or shower transfer.    Follow Up Recommendations  No OT follow up;Supervision - Intermittent    Equipment Recommendations  3 in 1 bedside comode;Other (comment) (AE)    Recommendations for Other Services       Precautions / Restrictions Precautions Precautions: Back Precaution Booklet Issued: Yes (comment) Precaution Comments: educated on precautions Restrictions Weight Bearing Restrictions: No      Mobility Bed Mobility Overal bed mobility: Needs Assistance Bed Mobility: Rolling;Sidelying to Sit;Sit to Sidelying Rolling: Min assist Sidelying to sit: Mod assist     Sit to sidelying: Mod assist General bed mobility comments: assist with legs to get back in bed. Assist with trunk to come to sitting position.   Transfers Overall transfer level: Needs assistance   Transfers: Sit to/from Stand Sit to Stand: Min guard              Balance                                            ADL Overall ADL's : Needs assistance/impaired                     Lower Body Dressing: Minimal assistance;With adaptive equipment;Sit to/from stand   Toilet Transfer: Min guard;Ambulation (bed)           Functional mobility during ADLs: Min guard General ADL Comments: Educated on use of cup for teeth care and placement of grooming items to avoid breaking precautions. Educated on AE for LB ADLs and pt practiced donning/doffing sock with reacher/sockaid. Educated on toilet aide for hygiene as pt states this  was difficult to reach prior to surgery. Recommended sitting for LB bathing and discussed 3 in 1.     Vision                     Perception     Praxis      Pertinent Vitals/Pain Pain Assessment: 0-10 Pain Score: 7  Pain Location: back Pain Descriptors / Indicators: Aching Pain Intervention(s): Monitored during session;Repositioned     Hand Dominance Right   Extremity/Trunk Assessment Upper Extremity Assessment Upper Extremity Assessment: Overall WFL for tasks assessed   Lower Extremity Assessment Lower Extremity Assessment: Defer to PT evaluation       Communication Communication Communication: No difficulties   Cognition Arousal/Alertness: Awake/alert Behavior During Therapy: WFL for tasks assessed/performed Overall Cognitive Status: Within Functional Limits for tasks assessed                     General Comments       Exercises       Shoulder Instructions      Home Living Family/patient expects to be discharged to:: Private residence Living Arrangements: Parent;Children Available Help at Discharge: Family;Available 24 hours/day Type of Home: House Home Access: Stairs to enter Entergy CorporationEntrance Stairs-Number of Steps: 5 Entrance Stairs-Rails: Right;Left Home Layout: One level  Bathroom Shower/Tub: Tub/shower unit;Walk-in shower   Bathroom Toilet: Standard                Prior Functioning/Environment Level of Independence: Independent             OT Diagnosis: Acute pain   OT Problem List: Decreased activity tolerance;Decreased range of motion;Impaired balance (sitting and/or standing);Decreased knowledge of use of DME or AE;Decreased knowledge of precautions;Pain   OT Treatment/Interventions: Self-care/ADL training;DME and/or AE instruction;Therapeutic activities;Patient/family education;Balance training    OT Goals(Current goals can be found in the care plan section) Acute Rehab OT Goals Patient Stated Goal: not stated OT  Goal Formulation: With patient Time For Goal Achievement: 10/17/14 Potential to Achieve Goals: Good ADL Goals Pt Will Perform Lower Body Dressing: sit to/from stand;with adaptive equipment;with set-up;with supervision Pt Will Transfer to Toilet: with modified independence;ambulating (3 in 1 over commode) Pt Will Perform Toileting - Clothing Manipulation and hygiene: with modified independence;sit to/from stand;with adaptive equipment Pt Will Perform Tub/Shower Transfer: Tub transfer;Shower transfer;with supervision;ambulating;3 in 1 Additional ADL Goal #1: Pt will independently verbalize 3/3 back precautions. Additional ADL Goal #2: Pt will perform bed mobility at supervision level as precursor for ADLs.  OT Frequency: Min 2X/week   Barriers to D/C:            Co-evaluation              End of Session Equipment Utilized During Treatment: Gait belt Nurse Communication: Mobility status  Activity Tolerance: Patient tolerated treatment well Patient left: in bed;with call bell/phone within reach   Time: [id number] OT Time Calculation (min): 20 min Charges:  OT General Charges $OT Visit: 1 Procedure OT Evaluation $Initial OT Evaluation Tier I: 1 Procedure OT Treatments $Self Care/Home Management : 8-22 mins G-CodesEarlie Raveling:    Stella Encarnacion L OTR/L [phone] 10/10/2014, 5:47 PM

## 2014-10-10 NOTE — Transfer of Care (Signed)
Immediate Anesthesia Transfer of Care Note  Patient: Crystal Acosta  Procedure(s) Performed: Procedure(s) with comments: ANTERIOR LUMBAR FUSION 1 LEVEL (N/A) - ANTERIOR LUMBAR FUSION 1 LEVEL LUMBAR 5-SACRAL 1 ABDOMINAL EXPOSURE (N/A) - ABDOMINAL EXPOSURE  Patient Location: PACU  Anesthesia Type:General  Level of Consciousness: awake, alert  and oriented  Airway & Oxygen Therapy: Patient connected to face mask oxygen  Post-op Assessment: Report given to PACU RN  Post vital signs: stable  Complications: No apparent anesthesia complications

## 2014-10-10 NOTE — Anesthesia Preprocedure Evaluation (Addendum)
Anesthesia Evaluation  Patient identified by MRN, date of birth, ID band Patient awake    Reviewed: Allergy & Precautions, H&P , NPO status , Patient's Chart, lab work & pertinent test results, reviewed documented beta blocker date and time   History of Anesthesia Complications Negative for: history of anesthetic complications  Airway Mallampati: II  TM Distance: >3 FB Neck ROM: Full    Dental  (+) Teeth Intact, Dental Advisory Given, Missing, Chipped   Pulmonary Current Smoker,  breath sounds clear to auscultation        Cardiovascular - anginanegative cardio ROS  Rhythm:Regular Rate:Normal     Neuro/Psych Anxiety Chronic back pain    GI/Hepatic negative GI ROS, Neg liver ROS,   Endo/Other  negative endocrine ROS  Renal/GU negative Renal ROS     Musculoskeletal  (+) Arthritis -,   Abdominal (+)  Abdomen: soft. Bowel sounds: normal.  Peds  Hematology negative hematology ROS (+)   Anesthesia Other Findings   Reproductive/Obstetrics 10/08/14 preg test: NEG LMP 3 days ago                          Anesthesia Physical Anesthesia Plan  ASA: II  Anesthesia Plan: General   Post-op Pain Management:    Induction: Intravenous  Airway Management Planned: Oral ETT  Additional Equipment:   Intra-op Plan:   Post-operative Plan:   Informed Consent: I have reviewed the patients History and Physical, chart, labs and discussed the procedure including the risks, benefits and alternatives for the proposed anesthesia with the patient or authorized representative who has indicated his/her understanding and acceptance.   Dental advisory given  Plan Discussed with: Surgeon and CRNA  Anesthesia Plan Comments: (Plan routine monitors, GETA)        Anesthesia Quick Evaluation

## 2014-10-10 NOTE — Op Note (Signed)
Preoperative diagnosis: Degenerative disc disease L5-S1 left greater than right S1 and L5 radiculopathies lumbar spinal stenosis and severe foraminal stenosis due to the degenerative collapse at the L5-S1disc space  Postoperative diagnosis: Same  Procedure: Anterior lumbar interbody fusion L5-S1 using the globus integrated peek cage with 3:30 millimeter 5.0 titanium screws one anchoring at L5-S1 utilizing BMP and DBX mix  Surgeon: Donalee CitrinGary Kadedra Vanaken  Co-surgeon for exposure Dr. Tawanna Coolerodd Early  Assistant: Marikay Alaravid Jones  Anesthesia: Gen.  EBL: Minimal  History of present illness: Patient is a very pleasant 33 year old female has had progressive worsening back and predominant left leg pain had previous laminectomy and discectomy on the left at L5-S1 and did very well initially however last several months and years of progressive worsening probably mechanical back pain with secondary radiculopathy and imaging showed a progressive duration any progressive collapse around the L5-S1 disc space with compression of the L5 nerve root in the extra foraminal space and motor changes arthritic changes in the endplates at L5 and S1. Due to the patient's failure of conservative treatment imaging findings and progressive clinical syndrome I recommended a fusion L5-S1 from an anterior approach. I extensively went over the risks and benefits of the operation with the patient as well as perioperative course expectations of outcome and alternatives of surgery and she understood and agreed to proceed forward.  Operative procedure: Patient brought into the or was induced on general anesthesia is supine in the initial exposure was carried out by Dr. Tawanna Coolerodd early and will be dictated in a separate operative note after adequate exposure been achieved at the L5-S1 disc space and fluoroscopy confirmed the location as well as the central aspect of the disc annulotomy was made with a 15 scalpel disc was then cleaned out with a combination of  curettes pituitary rongeurs and Kerrison rongeurs. Marching inferiorly towards the posterior aspect the disc space I aggressively under bit the endplates at both L5 and S1 in the epidural space utilizing into and through the Kerrison punch and paddle distractors. I was able palpate the pedicle and the foramen bilaterally. Confirm there was no further stenosis then utilizing trials and fluoroscopy I selected a large cage 13 mm tall 15 lordosis and packed this with DBX mix and BMP sponge. I then inserted with one screw hole superiorly in the L5 vertebral body and 2 inferiorly and the S1 using a drill and awl I then cannulated the proper trajectory and the vertebral bodies and placed all 3 screws also discussed 3 screws had excellent purchase I seated them and then engaged the locking mechanism posterior fluoroscopy confirmed good position of the implant both in AP and lateral projections. There was an copious irrigated Dickinsonwas maintained the retractor was removed sequentially and no significant bleeding was identified and the wounds and closed with interrupted Vicryl and the external fascia running 4-0 subcuticular after a subcutaneous cutaneous layer. Again the case all needle counts sponge counts were correct postop x-ray did not show any evidence of sponges.

## 2014-10-10 NOTE — Op Note (Signed)
    OPERATIVE REPORT  DATE OF SURGERY: 10/10/2014  PATIENT: Crystal Acosta, 33 y.o. female MRN: [id number]  DOB: 09/13/1981  PRE-OPERATIVE DIAGNOSIS: Degenerative disc disease L5-S1  POST-OPERATIVE DIAGNOSIS:  Same  PROCEDURE: Anterior exposure for L5-S1 fusion  SURGEON:  Gretta Beganodd Early, M.D.  Co-surgeon for the exposure Dr. Donalee CitrinGary Cram  ANESTHESIA:  Gen.  EBL: Minimal ml  Total I/O In: 1400 [I.V.:1400] Out: 430 [Urine:280; Blood:150]  BLOOD ADMINISTERED: None  DRAINS: None    COUNTS CORRECT:  YES  PLAN OF CARE: PACU   PATIENT DISPOSITION:  PACU - hemodynamically stable  PROCEDURE DETAILS: Patient seen in the preoperative holding area and I discussed my role for exposure. Explained mobilization of the intracranial contents, left ureter and iliac arteries and veins. Explained potential for injury to these structures as well.  Patient was placed in supine position and lateral C-arm projections used to demonstrate the level of the L5-S1 disc. An incision was made from the left lower quadrant transverse incision. This was carried down through the subcutaneous fat with electrocautery. The fat was mobilized off the anterior rectus sheath. The anterior rectus sheath was opened in line with skin incision. The rectus muscle was mobilized superiorly and inferiorly and was immobilized circumferentially. The retroperitoneum was entered bluntly and the left lower quadrant and blunt dissection was used to mobilize intraperitoneal sac to the right. The posterior rectus sheath was opened laterally. The intracranial peritoneal cavity was not entered. Dissection was continued with blunt mobilization. To the level of the soleus muscle. Iliac vessels were identified. Ureter was also identified and protected and mobilized to the right. Blunt dissection over the L5-S1 disc was continued and the middle sacral artery was clipped with ligaclips and divided. A blood sections extended to the right and left  sides of the L5-S1 disc. Thompson retractor was brought to the field and the reverse lip 150 Blazer position to the right and left of the L5-S1 disc. Notable retractors were placed for superior and inferior exposure. C-arm was brought back on field to confirm that this was indeed the L5-S1 disc. The remainder of the procedure will be dictated as a separate note by Dr.Cram   Gretta Beganodd Early, M.D. 10/10/2014 3:03 PM

## 2014-10-10 NOTE — Plan of Care (Signed)
Problem: Consults Goal: Diagnosis - Spinal Surgery Outcome: Completed/Met Date Met:  10/10/14 Thoraco/Lumbar Spine Fusion

## 2014-10-10 NOTE — H&P (Signed)
Crystal Acosta is an 33 y.o. female.   Chief Complaint: Back and predominantly left leg pain HPI: Patient is a very pleasant 33 year old female who has had progressive worsening back pain with radiation down her leg consistent with both an L5 and S1 nerve root pattern patient undergone previous discectomy approximately 45 years ago and initially did very well however the pain got progressively worse workup revealed progressive degenerative collapse with compression of the L5 nerve root and the extra foraminal space due to the degeneration and collapse of the L5-S1 disc space. Patient's symptomatology was predominantly back pain with occasional pain that went down her leg and the pain was mechanical in nature. Patient refractory to all forms of conservative treatment due to progressive clinical syndrome imaging findings and failure conservative treatment I recommended anterior lumbar interbody fusion. I've extensively gone over the risks and benefits of the operation with the patient as well as perioperative course expectations of outcome and alternatives of surgery and she understood and agreed to proceed forward.  Past Medical History  Diagnosis Date  . H/O varicella   . Gallstones   . 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 Anxiety     anxiety disorder  . Bulging lumbar disc     Past Surgical History  Procedure Laterality Date  . Cholecystectomy    . Cholecystectomy  07/11/2012    Procedure: LAPAROSCOPIC CHOLECYSTECTOMY WITH INTRAOPERATIVE CHOLANGIOGRAM;  Surgeon: Stark Klein, MD;  Location: [facility];  Service: General;  Laterality: N/A;  . Ercp  07/12/2012    Procedure: ENDOSCOPIC RETROGRADE CHOLANGIOPANCREATOGRAPHY (ERCP);  Surgeon: Jeryl Columbia, MD;  Location: [facility];  Service: Endoscopy;  Laterality: N/A;  . Esophagogastroduodenoscopy  08/01/2012    Procedure: ESOPHAGOGASTRODUODENOSCOPY (EGD);  Surgeon: Cleotis Nipper, MD;  Location: [facility];  Service: Endoscopy;   Laterality: N/A;  . Back surgery      will be her second    Family History  Problem Relation Age of Onset  . Hypertension Mother   . Heart disease Maternal Grandfather    Social History:  reports that she has been smoking Cigarettes.  She has a 6 pack-year smoking history. She has never used smokeless tobacco. She reports that she does not drink alcohol or use illicit drugs.  Allergies:  Allergies  Allergen Reactions  . Questran [Cholestyramine] Hives    Medications Prior to Admission  Medication Sig Dispense Refill  . diazepam (VALIUM) 5 MG tablet Take 5 mg by mouth every 12 (twelve) hours as needed for anxiety.      . DULoxetine (CYMBALTA) 30 MG capsule Take 30 mg by mouth 2 (two) times daily.      . meloxicam (MOBIC) 7.5 MG tablet Take 7.5 mg by mouth daily.      Marland Kitchen oxyCODONE-acetaminophen (PERCOCET/ROXICET) 5-325 MG per tablet Take 1 tablet by mouth every 6 (six) hours as needed for severe pain (pain).      . traZODone (DESYREL) 50 MG tablet Take 100 mg by mouth at bedtime.        Results for orders placed during the hospital encounter of 10/08/14 (from the past 48 hour(s))  SURGICAL PCR SCREEN     Status: None   Collection Time    10/08/14  1:48 PM      Result Value Ref Range   MRSA, PCR NEGATIVE  NEGATIVE   Staphylococcus aureus NEGATIVE  NEGATIVE   Comment:            The Xpert SA Assay (FDA  approved for NASAL specimens     in patients over 46 years of age),     is one component of     a comprehensive surveillance     program.  Test performance has     been validated by [employer] for patients greater     than or equal to 22 year old.     It is not intended     to diagnose infection nor to     guide or monitor treatment.  CBC     Status: Abnormal   Collection Time    10/08/14  1:48 PM      Result Value Ref Range   WBC 8.4  4.0 - 10.5 K/uL   RBC 4.65  3.87 - 5.11 MIL/uL   Hemoglobin 13.9  12.0 - 15.0 g/dL   HCT 41.1  36.0 - 46.0 %   MCV 88.4  78.0  - 100.0 fL   MCH 29.9  26.0 - 34.0 pg   MCHC 33.8  30.0 - 36.0 g/dL   RDW 14.1  11.5 - 15.5 %   Platelets 478 (*) 150 - 400 K/uL  HCG, SERUM, QUALITATIVE     Status: None   Collection Time    10/08/14  1:48 PM      Result Value Ref Range   Preg, Serum NEGATIVE  NEGATIVE   Comment:            THE SENSITIVITY OF THIS     METHODOLOGY IS >10 mIU/mL.  BASIC METABOLIC PANEL     Status: Abnormal   Collection Time    10/08/14  1:48 PM      Result Value Ref Range   Sodium 140  137 - 147 mEq/L   Potassium 3.9  3.7 - 5.3 mEq/L   Chloride 102  96 - 112 mEq/L   CO2 24  19 - 32 mEq/L   Glucose, Bld 105 (*) 70 - 99 mg/dL   BUN 8  6 - 23 mg/dL   Creatinine, Ser 0.74  0.50 - 1.10 mg/dL   Calcium 9.7  8.4 - 10.5 mg/dL   GFR calc non Af Amer >90  >90 mL/min   GFR calc Af Amer >90  >90 mL/min   Comment: (NOTE)     The eGFR has been calculated using the CKD EPI equation.     This calculation has not been validated in all clinical situations.     eGFR's persistently <90 mL/min signify possible Chronic Kidney     Disease.   Anion gap 14  5 - 15  TYPE AND SCREEN     Status: None   Collection Time    10/08/14  1:53 PM      Result Value Ref Range   ABO/RH(D) O POS     Antibody Screen NEG     Sample Expiration 10/22/2014    ABO/RH     Status: None   Collection Time    10/08/14  1:53 PM      Result Value Ref Range   ABO/RH(D) O POS     No results found.  Review of Systems  Constitutional: Negative.   HENT: Negative.   Eyes: Negative.   Respiratory: Negative.   Cardiovascular: Negative.   Gastrointestinal: Negative.   Genitourinary: Negative.   Musculoskeletal: Positive for back pain, joint pain and myalgias.  Skin: Negative.   Neurological: Positive for tingling.  Psychiatric/Behavioral: Negative.     Blood pressure 115/69, pulse  74, temperature 98 F (36.7 C), temperature source Oral, resp. rate 20, height $RemoveBe'5\' 6"'yyGQNCcar$  (1.676 m), weight 77.111 kg (170 lb), last menstrual period  10/07/2014, SpO2 98.00%. Physical Exam  Constitutional: She is oriented to person, place, and time. She appears well-developed and well-nourished.  HENT:  Head: Normocephalic and atraumatic.  Eyes: Pupils are equal, round, and reactive to light.  Neck: Normal range of motion.  Respiratory: Effort normal.  GI: Soft. Bowel sounds are normal.  Neurological: She is alert and oriented to person, place, and time. She has normal strength. GCS eye subscore is 4. GCS verbal subscore is 5. GCS motor subscore is 6.  Reflex Scores:      Tricep reflexes are 2+ on the right side and 2+ on the left side.      Bicep reflexes are 2+ on the right side and 2+ on the left side.      Brachioradialis reflexes are 2+ on the right side and 2+ on the left side.      Patellar reflexes are 2+ on the right side and 2+ on the left side.      Achilles reflexes are 2+ on the right side and 2+ on the left side. Strength is 5 out of 5 in her iliopsoas, quads, hamstrings, gastric, and tibialis, EHL.  Skin: Skin is warm and dry.     Assessment/Plan 33 year old female presents for an anterior lumbar interbody fusion.  Cyanna Neace P 10/10/2014, 8:18 AM

## 2014-10-10 NOTE — Anesthesia Procedure Notes (Signed)
Procedure Name: Intubation Date/Time: 10/10/2014 8:35 AM Performed by: Ellin GoodieWEAVER, Jeni Duling M Pre-anesthesia Checklist: Patient identified, Emergency Drugs available, Suction available, Patient being monitored and Timeout performed Patient Re-evaluated:Patient Re-evaluated prior to inductionOxygen Delivery Method: Circle system utilized Preoxygenation: Pre-oxygenation with 100% oxygen Intubation Type: IV induction Ventilation: Mask ventilation without difficulty Laryngoscope Size: Mac and 3 Grade View: Grade II Tube type: Oral Tube size: 7.5 mm Number of attempts: 1 Airway Equipment and Method: Stylet Placement Confirmation: ETT inserted through vocal cords under direct vision,  positive ETCO2 and breath sounds checked- equal and bilateral Secured at: 21 cm Tube secured with: Tape Dental Injury: Teeth and Oropharynx as per pre-operative assessment

## 2014-10-10 NOTE — Anesthesia Postprocedure Evaluation (Signed)
  Anesthesia Post-op Note  Patient: Hazel SamsBrean D Valent  Procedure(s) Performed: Procedure(s) with comments: ANTERIOR LUMBAR FUSION 1 LEVEL (N/A) - ANTERIOR LUMBAR FUSION 1 LEVEL LUMBAR 5-SACRAL 1 ABDOMINAL EXPOSURE (N/A) - ABDOMINAL EXPOSURE  Patient Location: PACU  Anesthesia Type:General  Level of Consciousness: awake, alert , oriented and patient cooperative  Airway and Oxygen Therapy: Patient Spontanous Breathing  Post-op Pain: mild  Post-op Assessment: Post-op Vital signs reviewed, Patient's Cardiovascular Status Stable, Respiratory Function Stable, Patent Airway, No signs of Nausea or vomiting and Pain level controlled  Post-op Vital Signs: Reviewed and stable  Last Vitals:  Filed Vitals:   10/10/14 1315  BP: 112/72  Pulse:   Temp:   Resp:     Complications: No apparent anesthesia complications

## 2014-10-11 MED ORDER — OXYCODONE-ACETAMINOPHEN 5-325 MG PO TABS: 1.0000 | ORAL_TABLET | ORAL | Status: DC | PRN

## 2014-10-11 MED ORDER — DIAZEPAM 5 MG PO TABS: 5.0000 mg | ORAL_TABLET | Freq: Two times a day (BID) | ORAL | Status: DC | PRN

## 2014-10-11 NOTE — Evaluation (Signed)
Physical Therapy Evaluation Patient Details Name: Crystal Acosta MRN: [id number] DOB: 04/23/1981 Today's Date: 10/11/2014   History of Present Illness  33 y.o. s/p ANTERIOR LUMBAR FUSION 1 LEVEL (N/A) - ANTERIOR LUMBAR FUSION 1 LEVEL LUMBAR 5-SACRAL 1.  Clinical Impression  PTA, pt independent with all mobility. Currently, pt requires supervision for all functional mobility. Precautions reviewed prior to mobilizing, pt able to adhere without cueing. Pt provided with education on brace. D/c plan discussed with pt, pt reports supervision will be available 24hr/day. No follow up PT required. Recommend supervision with mobility at initial d/c. Will sign off.     Follow Up Recommendations No PT follow up; Supervision for mobility/OOB    Equipment Recommendations  None recommended by PT    Recommendations for Other Services       Precautions / Restrictions Precautions Precautions: Back Precaution Booklet Issued: Yes (comment) Precaution Comments: reviewed precautions through teach back. pt able to verbalize understanding and demonstrate compliance throughout session Required Braces or Orthoses: Spinal Brace Spinal Brace: Lumbar corset;Applied in sitting position      Mobility  Bed Mobility Overal bed mobility: Needs Assistance Bed Mobility: Rolling;Sidelying to Sit Rolling: Supervision Sidelying to sit: Supervision;Min guard       General bed mobility comments: pt able to roll and come up to sitting without assist. guarding for safety. no cues for precautions. extrat time to come up to sitting. min guard progressed to supervision.  Transfers Overall transfer level: Needs assistance Equipment used: None Transfers: Sit to/from Stand Sit to Stand: Min guard;Supervision         General transfer comment: min guard progressed to supervision. no physical assistance required. pt able to maintain precautions to come to standing. no instability. no reports of dizziness or increase  in pain.  Ambulation/Gait Ambulation/Gait assistance: Supervision Ambulation Distance (Feet): 300 Feet Assistive device: None Gait Pattern/deviations: Step-through pattern;Decreased stride length;Wide base of support Gait velocity: slow Gait velocity interpretation: Below normal speed for age/gender General Gait Details: pt demonstrates increased toes out B LE, R>L. throughout ambulation, with clinched fist, per pt report due to pain. Limited UE swing. pt guarding throughout ambulation  Stairs Stairs: Yes   Stair Management: Two rails;Alternating pattern (1 hand hold to act as railing) Number of Stairs: 5 General stair comments: no physical assistance required. no bouts of instability or LOB.   Wheelchair Mobility    Modified Rankin (Stroke Patients Only)       Balance Overall balance assessment: Needs assistance Sitting-balance support: Feet supported Sitting balance-Leahy Scale: Good     Standing balance support: During functional activity Standing balance-Leahy Scale: Fair Standing balance comment: demonstrates guarding due to pain                             Pertinent Vitals/Pain Pain Assessment: 0-10 Pain Score: 5  Pain Location: back/incision Pain Descriptors / Indicators: Aching;Sore;Guarding Pain Intervention(s): Limited activity within patient's tolerance;Monitored during session    Home Living Family/patient expects to be discharged to:: Private residence Living Arrangements: Parent;Children Available Help at Discharge: Family;Available 24 hours/day Type of Home: House Home Access: Stairs to enter Entrance Stairs-Rails: Doctor, general practiceight;Left Entrance Stairs-Number of Steps: 5 Home Layout: One level Home Equipment: None      Prior Function Level of Independence: Independent               Hand Dominance   Dominant Hand: Right    Extremity/Trunk Assessment   Upper Extremity Assessment:  Overall WFL for tasks assessed           Lower  Extremity Assessment: Overall WFL for tasks assessed         Communication   Communication: No difficulties  Cognition Arousal/Alertness: Awake/alert Behavior During Therapy: WFL for tasks assessed/performed Overall Cognitive Status: Within Functional Limits for tasks assessed                      General Comments General comments (skin integrity, edema, etc.): Pt reports that she will have family around to provide assistance/supervision initially. D/c plan discussed with patient, pt agreeable. Reviewed precauitions with pt, pt demonstrates compliance throughout mobility activities. Brace applied sitting EOB, pt educated on brace application and how/when to wear it.     Exercises General Exercises - Lower Extremity Ankle Circles/Pumps: AROM;Both;15 reps;Supine      Assessment/Plan    PT Assessment Patent does not need any further PT services  PT Diagnosis Acute pain;Difficulty walking   PT Problem List    PT Treatment Interventions     PT Goals (Current goals can be found in the Care Plan section) Acute Rehab PT Goals Patient Stated Goal: to go home PT Goal Formulation: With patient    Frequency     Barriers to discharge        Co-evaluation               End of Session Equipment Utilized During Treatment: Gait belt;Back brace Activity Tolerance: Patient tolerated treatment well;Patient limited by pain Patient left: in chair;with call bell/phone within reach Nurse Communication: Mobility status;Precautions         Time: 0810-0825 PT Time Calculation (min): 15 min   Charges:         PT G Codes:          Orlondo Holycross 10/11/2014, 8:56 AM Cathlyn ParsonsElizabeth Sanela Evola, SPT

## 2014-10-11 NOTE — Progress Notes (Signed)
Patient ID: Crystal Acosta, female   DOB: 01/12/1981, 33 y.o.   MRN: [id number] Comfortable. No nausea Abd soft. Stable from vasc standpoint

## 2014-10-11 NOTE — Progress Notes (Signed)
Occupational Therapy Treatment Patient Details Name: Crystal Acosta MRN: [id number] DOB: [birth year] Today's Date: 10/11/2014    History of present illness 33 y.o. s/p ANTERIOR LUMBAR FUSION 1 LEVEL (N/A) - ANTERIOR LUMBAR FUSION 1 LEVEL LUMBAR 5-SACRAL 1.   OT comments  Pt seen today for ADL session. Pt used AE to dress for d/c and was issued AE (hip kit, tongs/toilet aid). Pt overall at Setup/Supervision level and is ready for d/c.    Follow Up Recommendations  No OT follow up;Supervision - Intermittent    Equipment Recommendations  3 in 1 bedside comode;Other (comment)    Recommendations for Other Services      Precautions / Restrictions Precautions Precautions: Back Precaution Booklet Issued: Yes (comment) Precaution Comments: reviewed precautions through teach back. pt able to verbalize understanding and demonstrate compliance throughout session Required Braces or Orthoses: Spinal Brace Spinal Brace: Lumbar corset;Applied in sitting position Restrictions Weight Bearing Restrictions: No       Mobility Bed Mobility       General bed mobility comments: Pt sitting up in recliner and reports no difficulty with bed mobility using log roll (Supervision per PT note).   Transfers Overall transfer level: Needs assistance Equipment used: None Transfers: Sit to/from Stand Sit to Stand: Supervision         General transfer comment: Supervision for safety.     Balance Overall balance assessment: Needs assistance Sitting-balance support: Feet supported Sitting balance-Leahy Scale: Good     Standing balance support: During functional activity Standing balance-Leahy Scale: Fair Standing balance comment: demonstrates guarding due to pain                   ADL Overall ADL's : Needs assistance/impaired     Grooming: Supervision/safety;Standing           Upper Body Dressing : Set up;Sitting Upper Body Dressing Details (indicate cue type and reason): pt  demonstrated back precautions while dressing Lower Body Dressing: Set up;Supervision/safety;With adaptive equipment;Sit to/from stand Lower Body Dressing Details (indicate cue type and reason): pt used reacher and sock aid for LB dressing with adherence to back precautions. Toilet Transfer: Supervision/safety;Ambulation     Toileting - Clothing Manipulation Details (indicate cue type and reason): reviewed use of tongs as toilet aid and issed to pt with pt reported understanding     Functional mobility during ADLs: Supervision/safety General ADL Comments: Reinforced education on environmental modifications, fall prevention, and incorporating back precautions into ADLs.                 Cognition  Arousal/Alertness: Awake/Alert Behavior During Therapy: WFL for tasks assessed/performed Overall Cognitive Status: Within Functional Limits for tasks assessed                       Extremity/Trunk Assessment  Upper Extremity Assessment Upper Extremity Assessment: Overall WFL for tasks assessed   Lower Extremity Assessment Lower Extremity Assessment: Overall WFL for tasks assessed                   Pertinent Vitals/ Pain       Pain Assessment: 0-10 Pain Score: 5  Pain Location: back/incision Pain Descriptors / Indicators: Aching;Sore Pain Intervention(s): Limited activity within patient's tolerance;Monitored during session;Repositioned     Prior Functioning/Environment Level of Independence: Independent            Frequency Min 2X/week     Progress Toward Goals  OT Goals(current goals can now be found in the care  plan section)  Progress towards OT goals: Progressing toward goals  Acute Rehab OT Goals Patient Stated Goal: home today  Plan Discharge plan remains appropriate       End of Session Equipment Utilized During Treatment: Back brace   Activity Tolerance Patient tolerated treatment well   Patient Left in chair;with call bell/phone within  reach   Nurse Communication Other (comment) (pt dressed and ready for d/c from OT standpoint)        Time: 0825-0839 OT Time Calculation (min): 14 min  Charges: OT General Charges $OT Visit: 1 Procedure OT Treatments $Self Care/Home Management : 8-22 mins  Villa Herb M 10/11/2014, 10:19 AM  Cyndie Chime, OTR/L Occupational Therapist [phone] (pager)

## 2014-10-11 NOTE — Progress Notes (Signed)
Pt doing well. Pt given D/C instructions with Rx's, verbal understanding was provided. [name] incision is clean and dry with no sign of infection. [name] IV was removed prior to D/C. Pt received corsett brace from [employer] prior to D/C. Pt D/C'd home via wheelchair @ 1135 per MD order. Pt is stable @ D/C and has no other needs at this time.  Rema FendtAshley Wiletta Bermingham, RN

## 2014-10-11 NOTE — Progress Notes (Signed)
Patient ID: Crystal Acosta, female   DOB: 04/13/1981, 33 y.o.   MRN: [id number] Doing well no leg pain back pain manageable  Strength 5 out of 5 incision clean dry and intact  Discharge home when she receives her brace.

## 2014-10-11 NOTE — Discharge Summary (Signed)
  Physician Discharge Summary  Patient ID: Crystal Acosta MRN: [id number] DOB/AGE: 33/12/1980 33 y.o.  Admit date: 10/10/2014 Discharge date: 10/11/2014  Admission Diagnoses: Degenerative seizures lumbar spinal stenosis L5-S1  Discharge Diagnoses: Same Active Problems:   DDD (degenerative disc disease), lumbosacral   Discharged Condition: good  Hospital Course: Patient admitted hospital underwent anterior lumbar interbody fusion L5-S1 postoperative patient did very well when to covering for on the floor she was ambulating voiding spontaneous and tolerating regular stable for discharge home.  Consults: Significant Diagnostic Studies: Treatments: ALIF L5-S1 Discharge Exam: Blood pressure 112/61, pulse 77, temperature 99.1 F (37.3 C), temperature source Oral, resp. rate 18, height 5\' 6"  (1.676 m), weight 77.111 kg (170 lb), last menstrual period 10/07/2014, SpO2 96.00%. Strength out of 5 wound clean dry and intact  Disposition: Home     Medication List         diazepam 5 MG tablet  Commonly known as:  VALIUM  Take 5 mg by mouth every 12 (twelve) hours as needed for anxiety.     diazepam 5 MG tablet  Commonly known as:  VALIUM  Take 1 tablet (5 mg total) by mouth every 12 (twelve) hours as needed for anxiety.     DULoxetine 30 MG capsule  Commonly known as:  CYMBALTA  Take 30 mg by mouth 2 (two) times daily.     meloxicam 7.5 MG tablet  Commonly known as:  MOBIC  Take 7.5 mg by mouth daily.     oxyCODONE-acetaminophen 5-325 MG per tablet  Commonly known as:  PERCOCET/ROXICET  Take 1 tablet by mouth every 6 (six) hours as needed for severe pain (pain).     oxyCODONE-acetaminophen 5-325 MG per tablet  Commonly known as:  PERCOCET/ROXICET  Take 1-2 tablets by mouth every 4 (four) hours as needed for moderate pain.     traZODone 50 MG tablet  Commonly known as:  DESYREL  Take 100 mg by mouth at bedtime.           Follow-up Information   [facility]  [facility] Hanners P, MD.   Specialty:  Neurosurgery   Contact information:   [address] [place] [address] [phone]       Signed: Joshlynn Alfonzo P 10/11/2014, 7:35 AM

## 2014-10-11 NOTE — Discharge Instructions (Signed)
No lifting no bending no twisting or driving keep the incision clean dry and intact.   Wound Care Keep incision covered and dry for one week.  If you shower prior to then, cover incision with plastic wrap.  You may remove outer bandage after one week and shower.  Do not put any creams, lotions, or ointments on incision. Leave steri-strips on neck.  They will fall off by themselves. Activity Walk each and every day, increasing distance each day. No lifting greater than 5 lbs.  Avoid bending, arching, or twisting. No driving for 2 weeks; may ride as a passenger locally. If provided with back brace, wear when out of bed.  It is not necessary to wear in bed. Diet Resume your normal diet.  Return to Work Will be discussed at you follow up appointment. Call Your Doctor If Any of These Occur Redness, drainage, or swelling at the wound.  Temperature greater than 101 degrees. Severe pain not relieved by pain medication. Incision starts to come apart. Follow Up Appt Call today for appointment in 1-2 weeks ([phone]) or for problems.  If you have any hardware placed in your spine, you will need an x-ray before your appointment.

## 2014-10-11 NOTE — Evaluation (Signed)
Reviewed assessment and agree with POC.  Massiah Longanecker, PT DPT  [phone]  

## 2014-10-12 ENCOUNTER — Encounter (HOSPITAL_COMMUNITY): Payer: Self-pay | Admitting: Neurosurgery

## 2014-10-15 ENCOUNTER — Encounter (HOSPITAL_COMMUNITY): Payer: Self-pay | Admitting: Neurosurgery

## 2014-12-14 DIAGNOSIS — J189 Pneumonia, unspecified organism: Secondary | ICD-10-CM

## 2014-12-14 HISTORY — DX: Pneumonia, unspecified organism: J18.9

## 2014-12-16 ENCOUNTER — Emergency Department (HOSPITAL_COMMUNITY): Payer: Medicaid Other

## 2014-12-16 ENCOUNTER — Emergency Department (HOSPITAL_COMMUNITY)
Admission: EM | Admit: 2014-12-16 | Discharge: 2014-12-16 | Disposition: A | Discharge status: Home / Self Care | Payer: Medicaid Other | Attending: Emergency Medicine | Admitting: Emergency Medicine

## 2014-12-16 ENCOUNTER — Encounter (HOSPITAL_COMMUNITY): Payer: Self-pay | Admitting: Emergency Medicine

## 2014-12-16 DIAGNOSIS — Z79899 Other long term (current) drug therapy: Secondary | ICD-10-CM | POA: Diagnosis not present

## 2014-12-16 DIAGNOSIS — R000 Tachycardia, unspecified: Secondary | ICD-10-CM | POA: Insufficient documentation

## 2014-12-16 DIAGNOSIS — Z8739 Personal history of other diseases of the musculoskeletal system and connective tissue: Secondary | ICD-10-CM | POA: Insufficient documentation

## 2014-12-16 DIAGNOSIS — F419 Anxiety disorder, unspecified: Secondary | ICD-10-CM | POA: Insufficient documentation

## 2014-12-16 DIAGNOSIS — Z791 Long term (current) use of non-steroidal anti-inflammatories (NSAID): Secondary | ICD-10-CM | POA: Insufficient documentation

## 2014-12-16 DIAGNOSIS — Z720 Tobacco use: Secondary | ICD-10-CM | POA: Diagnosis not present

## 2014-12-16 DIAGNOSIS — Z8619 Personal history of other infectious and parasitic diseases: Secondary | ICD-10-CM | POA: Diagnosis not present

## 2014-12-16 DIAGNOSIS — Z3202 Encounter for pregnancy test, result negative: Secondary | ICD-10-CM | POA: Insufficient documentation

## 2014-12-16 DIAGNOSIS — J159 Unspecified bacterial pneumonia: Secondary | ICD-10-CM | POA: Diagnosis not present

## 2014-12-16 DIAGNOSIS — R0602 Shortness of breath: Secondary | ICD-10-CM | POA: Diagnosis present

## 2014-12-16 DIAGNOSIS — J189 Pneumonia, unspecified organism: Secondary | ICD-10-CM

## 2014-12-16 DIAGNOSIS — Z8719 Personal history of other diseases of the digestive system: Secondary | ICD-10-CM | POA: Diagnosis not present

## 2014-12-16 LAB — BASIC METABOLIC PANEL
Anion gap: 8 (ref 5–15)
BUN: 8 mg/dL (ref 6–23)
CO2: 26 mmol/L (ref 19–32)
CREATININE: 0.74 mg/dL (ref 0.50–1.10)
Calcium: 9.4 mg/dL (ref 8.4–10.5)
Chloride: 105 mEq/L (ref 96–112)
GFR calc Af Amer: >90 mL/min (ref >90)
Glucose, Bld: 136 mg/dL — ABNORMAL HIGH (ref 70–99)
Potassium: 4.1 mmol/L (ref 3.5–5.1)
Sodium: 139 mmol/L (ref 135–145)

## 2014-12-16 LAB — CBC
HCT: 43.8 % (ref 36.0–46.0)
Hemoglobin: 14.2 g/dL (ref 12.0–15.0)
MCH: 29.5 pg (ref 26.0–34.0)
MCHC: 32.4 g/dL (ref 30.0–36.0)
MCV: 90.9 fL (ref 78.0–100.0)
Platelets: 367 10*3/uL (ref 150–400)
RBC: 4.82 MIL/uL (ref 3.87–5.11)
RDW: 14.3 % (ref 11.5–15.5)
WBC: 7.6 10*3/uL (ref 4.0–10.5)

## 2014-12-16 LAB — POC URINE PREG, ED: PREG TEST UR: NEGATIVE

## 2014-12-16 MED ORDER — LEVOFLOXACIN IN D5W 750 MG/150ML IV SOLN
750.0000 mg | Freq: Once | INTRAVENOUS | Status: AC
  Administered 2014-12-16: 750 mg via INTRAVENOUS
  Filled 2014-12-16: qty 150

## 2014-12-16 MED ORDER — BENZONATATE 100 MG PO CAPS
100.0000 mg | ORAL_CAPSULE | Freq: Once | ORAL | Status: AC
  Administered 2014-12-16: 100 mg via ORAL
  Filled 2014-12-16: qty 1

## 2014-12-16 MED ORDER — HYDROCODONE-ACETAMINOPHEN 7.5-325 MG/15ML PO SOLN: 15.0000 mL | Freq: Four times a day (QID) | ORAL | Status: DC | PRN

## 2014-12-16 MED ORDER — LEVOFLOXACIN 750 MG PO TABS: 750.0000 mg | ORAL_TABLET | Freq: Every day | ORAL | Status: DC

## 2014-12-16 MED ORDER — IPRATROPIUM-ALBUTEROL 0.5-2.5 (3) MG/3ML IN SOLN
3.0000 mL | Freq: Once | RESPIRATORY_TRACT | Status: AC
  Administered 2014-12-16: 3 mL via RESPIRATORY_TRACT
  Filled 2014-12-16: qty 3

## 2014-12-16 MED ORDER — IOHEXOL 350 MG/ML SOLN
100.0000 mL | Freq: Once | INTRAVENOUS | Status: AC | PRN
  Administered 2014-12-16: 100 mL via INTRAVENOUS

## 2014-12-16 MED ORDER — SODIUM CHLORIDE 0.9 % IV BOLUS (SEPSIS)
1000.0000 mL | Freq: Once | INTRAVENOUS | Status: AC
  Administered 2014-12-16: 1000 mL via INTRAVENOUS

## 2014-12-16 MED ORDER — PREDNISONE 20 MG PO TABS
60.0000 mg | ORAL_TABLET | Freq: Once | ORAL | Status: AC
  Administered 2014-12-16: 60 mg via ORAL
  Filled 2014-12-16: qty 3

## 2014-12-16 NOTE — ED Notes (Signed)
Bed: WA21 Expected date:  Expected time:  Means of arrival:  Comments: 

## 2014-12-16 NOTE — ED Notes (Signed)
Awake. Verbally responsive. A/O x4. Resp even and unlabored. Occ nonproductive moist cough noted. ABC's intact.

## 2014-12-16 NOTE — ED Notes (Signed)
Ambulated the patient the the hallway.  Patient oxygen level was 96 and hear trate was 104.

## 2014-12-16 NOTE — Discharge Instructions (Signed)

## 2014-12-16 NOTE — ED Notes (Signed)
Pt from home c/o a cough x 4-5 days. She reports she feels sore in back and rib areas from coughing.  Green expectorant.

## 2014-12-16 NOTE — ED Notes (Signed)
Awake. Verbally responsive. A/O x4. Resp even and unlabored. No audible adventitious breath sounds noted but pt report productive cough of yellowish-green sputum. ABC's intact. SR on monitor at at 81bpm. IV infusing NS at 928ml/hr without difficulty. No adverse reaction to IV ABT noted.

## 2014-12-16 NOTE — ED Provider Notes (Signed)
CSN: [id number]     Arrival date & time 12/16/14  1442 History   First MD Initiated Contact with Patient 12/16/14 1525     Chief Complaint  Patient presents with  . Cough  . Shortness of Breath     (Consider location/radiation/quality/duration/timing/severity/associated sxs/prior Treatment) HPI   34 year old female with hx of anxiety presents for evaluation of cough.  Pt with no known hx of of asthma or COPD.  She report for the past 4-5 days she has had persistent cough productive with yellow phlegms, having increase DOE, getting out of breath from simply ambulating from room to room, report fever as high as 103.9, and also wheezing.  She has never wheezed before.  She also report worsening pleuritic chest pain.  sxs not improves with OTC medication. She report having back surgery on Oct 28th. She is a non smoker.  No prior hx of PE/DVT, no recent travel, oral contraception, unilateral leg swelling or calf pain, no active cancer.  Denies hemoptysis.  No hx of cardiac disease.  No other changes in her environment.  She was found to be hypoxic at 88% on arrival.  Sts her sxs improves after receiving albuterol/atrovent treatment.  LMP 11/25/2014.  No URI sxs.  Past Medical History  Diagnosis Date  . H/O varicella   . Gallstones   . 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 Anxiety     anxiety disorder  . Bulging lumbar disc    Past Surgical History  Procedure Laterality Date  . Cholecystectomy    . Cholecystectomy  07/11/2012    Procedure: LAPAROSCOPIC CHOLECYSTECTOMY WITH INTRAOPERATIVE CHOLANGIOGRAM;  Surgeon: Almond Lint, MD;  Location: [facility];  Service: General;  Laterality: N/A;  . Ercp  07/12/2012    Procedure: ENDOSCOPIC RETROGRADE CHOLANGIOPANCREATOGRAPHY (ERCP);  Surgeon: Petra Kuba, MD;  Location: [facility];  Service: Endoscopy;  Laterality: N/A;  . Esophagogastroduodenoscopy  08/01/2012    Procedure: ESOPHAGOGASTRODUODENOSCOPY (EGD);  Surgeon: Florencia Reasons, MD;   Location: [facility];  Service: Endoscopy;  Laterality: N/A;  . Back surgery      will be her second  . Anterior lumbar fusion N/A 10/10/2014    Procedure: ANTERIOR LUMBAR FUSION 1 LEVEL;  Surgeon: Mariam Dollar, MD;  Location: [facility];  Service: Neurosurgery;  Laterality: N/A;  ANTERIOR LUMBAR FUSION 1 LEVEL LUMBAR 5-SACRAL 1  . Abdominal exposure N/A 10/10/2014    Procedure: ABDOMINAL EXPOSURE;  Surgeon: Larina Earthly, MD;  Location: [facility];  Service: Vascular;  Laterality: N/A;  ABDOMINAL EXPOSURE   Family History  Problem Relation Age of Onset  . Hypertension Mother   . Heart disease Maternal Grandfather    History  Substance Use Topics  . Smoking status: Current Every Day Smoker -- 0.50 packs/day for 12 years    Types: Cigarettes  . Smokeless tobacco: Never Used  . Alcohol Use: No   OB History    Gravida Para Term Preterm AB TAB SAB Ectopic Multiple Living   Review of Systems  All other systems reviewed and are negative.     Allergies  Questran  Home Medications   Prior to Admission medications   Medication Sig Start Date End Date Taking? Authorizing Provider  diazepam (VALIUM) 5 MG tablet Take 5 mg by mouth every 12 (twelve) hours as needed for anxiety.    Historical Provider, MD  diazepam (VALIUM) 5 MG tablet Take  1 tablet (5 mg total) by mouth every 12 (twelve) hours as needed for anxiety. 10/11/14   Mariam Dollar, MD  DULoxetine (CYMBALTA) 30 MG capsule Take 30 mg by mouth 2 (two) times daily.    Historical Provider, MD  meloxicam (MOBIC) 7.5 MG tablet Take 7.5 mg by mouth daily.    Historical Provider, MD  oxyCODONE-acetaminophen (PERCOCET/ROXICET) 5-325 MG per tablet Take 1 tablet by mouth every 6 (six) hours as needed for severe pain (pain).    Historical Provider, MD  oxyCODONE-acetaminophen (PERCOCET/ROXICET) 5-325 MG per tablet Take 1-2 tablets by mouth every 4 (four) hours as needed for moderate pain. 10/11/14   Mariam Dollar, MD   traZODone (DESYREL) 50 MG tablet Take 100 mg by mouth at bedtime.    Historical Provider, MD   BP 102/64 mmHg  Pulse 116  Temp(Src) 98.3 F (36.8 C) (Oral)  Resp 18  Wt 185 lb (83.915 kg)  SpO2 91%  LMP 11/25/2014 Physical Exam  Constitutional: She appears well-developed and well-nourished. No distress.  HENT:  Head: Atraumatic.  Mouth/Throat: Oropharynx is clear and moist.  Eyes: Conjunctivae are normal.  Neck: Neck supple. No JVD present.  Cardiovascular:  Tachycardia without M/R/G  Pulmonary/Chest: Effort normal. No respiratory distress. She has wheezes (faint wheezes, with rhonchus sounds to upper lung fields).  Musculoskeletal: She exhibits no edema.  Neurological: She is alert.  Skin: No rash noted.  Psychiatric: She has a normal mood and affect.  Nursing note and vitals reviewed.   ED Course  Procedures (including critical care time)  Pt presents with pleuritic chest pain, cough, DOE and wheezing.  She was found to be hypoxic on arrival.  Her CXR is entirely unremarkable.  Unable to r/o PE using PERC criteria due to tachycardia. Given hx of back surgery remotely, and her hypoxia, i will obtain chest CTA to r/o PE or occult infection.  Pt agrees with plan.    7:00 PM Chest CTA shows no evidence of PE however there are signs of a possible early pneumonia. Getting her present symptoms, I will start her on antibiotics to treat for community acquired pneumonia. Patient voiced understanding and agrees with plan. Patient maintain adequate oxygenation while ambulating on room air.  Labs Review Labs Reviewed  BASIC METABOLIC PANEL - Abnormal; Notable for the following:    Glucose, Bld 136 (*)    All other components within normal limits  CBC  POC URINE PREG, ED    Imaging Review Dg Chest 2 View (if Patient Has Fever And/or Copd)  12/16/2014   CLINICAL DATA:  Five day history of cough and shortness of breath. Current smoker.  EXAM: CHEST  2 VIEW  COMPARISON:  None.   FINDINGS: Cardiomediastinal silhouette unremarkable. Lungs clear. Bronchovascular markings normal. Pulmonary vascularity normal. No visible pleural effusions. No pneumothorax. Mild degenerative changes involving the thoracic spine.  IMPRESSION: No acute cardiopulmonary disease.   Electronically Signed   By: Hulan Saas M.D.   On: 12/16/2014 15:26   Ct Angio Chest Pe W/cm &/or Wo Cm  12/16/2014   CLINICAL DATA:  Productive cough, shortness of breath and chest pain.  EXAM: CT ANGIOGRAPHY CHEST WITH CONTRAST  TECHNIQUE: Multidetector CT imaging of the chest was performed using the standard protocol during bolus administration of intravenous contrast. Multiplanar CT image reconstructions and MIPs were obtained to evaluate the vascular anatomy.  CONTRAST:  OMNIPAQUE IOHEXOL 350 MG/ML SOLN  COMPARISON:  None.  FINDINGS: There is no evidence of pulmonary embolism.  Mild patchy airspace opacity in the posterior right lower lobe may represent atelectasis but could also represent early pneumonia. No edema or pleural fluid is seen. No evidence of pneumothorax. No nodules or enlarged lymph nodes.  The thoracic aorta is of normal caliber and shows no evidence of dissection. The heart size is normal. No pericardial fluid. Visualized airways are normally patent. No bony abnormalities.  Review of the MIP images confirms the above findings.  IMPRESSION: No evidence of pulmonary embolism. Patchy opacity in the posterior right lower lobe which may represent atelectasis versus early pneumonia.   Electronically Signed   By: Irish Lack M.D.   On: 12/16/2014 17:10     EKG Interpretation None      MDM   Final diagnoses:  SOB (shortness of breath)  CAP (community acquired pneumonia)    BP 90/66 mmHg  Pulse 110  Temp(Src) 98.3 F (36.8 C) (Oral)  Resp 18  Wt 185 lb (83.915 kg)  SpO2 100%  LMP 11/25/2014 tachycardia 2/2 breathing treatment.  I have reviewed nursing notes and vital signs. I personally  reviewed the imaging tests through PACS system  I reviewed available ER/hospitalization records thought the EMR     Fayrene Helper, PA-C 12/16/14 1905  Tilden Fossa, MD 12/16/14 2205

## 2014-12-27 ENCOUNTER — Encounter (HOSPITAL_COMMUNITY): Payer: Self-pay | Admitting: General Surgery

## 2015-03-12 ENCOUNTER — Encounter (HOSPITAL_COMMUNITY): Payer: Self-pay | Admitting: Family Medicine

## 2015-03-12 ENCOUNTER — Emergency Department (HOSPITAL_COMMUNITY)
Admission: EM | Admit: 2015-03-12 | Discharge: 2015-03-12 | Disposition: A | Discharge status: Home / Self Care | Payer: Medicaid Other | Attending: Emergency Medicine | Admitting: Emergency Medicine

## 2015-03-12 ENCOUNTER — Emergency Department (HOSPITAL_COMMUNITY): Payer: Medicaid Other

## 2015-03-12 DIAGNOSIS — Z3202 Encounter for pregnancy test, result negative: Secondary | ICD-10-CM | POA: Diagnosis not present

## 2015-03-12 DIAGNOSIS — Z9089 Acquired absence of other organs: Secondary | ICD-10-CM | POA: Diagnosis not present

## 2015-03-12 DIAGNOSIS — Z8739 Personal history of other diseases of the musculoskeletal system and connective tissue: Secondary | ICD-10-CM | POA: Insufficient documentation

## 2015-03-12 DIAGNOSIS — F329 Major depressive disorder, single episode, unspecified: Secondary | ICD-10-CM | POA: Diagnosis not present

## 2015-03-12 DIAGNOSIS — R1031 Right lower quadrant pain: Secondary | ICD-10-CM | POA: Diagnosis not present

## 2015-03-12 DIAGNOSIS — Y9389 Activity, other specified: Secondary | ICD-10-CM | POA: Insufficient documentation

## 2015-03-12 DIAGNOSIS — Z8669 Personal history of other diseases of the nervous system and sense organs: Secondary | ICD-10-CM | POA: Insufficient documentation

## 2015-03-12 DIAGNOSIS — Z792 Long term (current) use of antibiotics: Secondary | ICD-10-CM | POA: Diagnosis not present

## 2015-03-12 DIAGNOSIS — Z8619 Personal history of other infectious and parasitic diseases: Secondary | ICD-10-CM | POA: Insufficient documentation

## 2015-03-12 DIAGNOSIS — Y9289 Other specified places as the place of occurrence of the external cause: Secondary | ICD-10-CM | POA: Diagnosis not present

## 2015-03-12 DIAGNOSIS — Y998 Other external cause status: Secondary | ICD-10-CM | POA: Diagnosis not present

## 2015-03-12 DIAGNOSIS — F419 Anxiety disorder, unspecified: Secondary | ICD-10-CM | POA: Insufficient documentation

## 2015-03-12 DIAGNOSIS — Z791 Long term (current) use of non-steroidal anti-inflammatories (NSAID): Secondary | ICD-10-CM | POA: Insufficient documentation

## 2015-03-12 DIAGNOSIS — S6992XA Unspecified injury of left wrist, hand and finger(s), initial encounter: Secondary | ICD-10-CM | POA: Insufficient documentation

## 2015-03-12 DIAGNOSIS — R110 Nausea: Secondary | ICD-10-CM | POA: Insufficient documentation

## 2015-03-12 DIAGNOSIS — R109 Unspecified abdominal pain: Secondary | ICD-10-CM | POA: Diagnosis present

## 2015-03-12 DIAGNOSIS — Z79899 Other long term (current) drug therapy: Secondary | ICD-10-CM | POA: Diagnosis not present

## 2015-03-12 DIAGNOSIS — X58XXXA Exposure to other specified factors, initial encounter: Secondary | ICD-10-CM | POA: Insufficient documentation

## 2015-03-12 DIAGNOSIS — R197 Diarrhea, unspecified: Secondary | ICD-10-CM | POA: Insufficient documentation

## 2015-03-12 DIAGNOSIS — R55 Syncope and collapse: Secondary | ICD-10-CM | POA: Insufficient documentation

## 2015-03-12 DIAGNOSIS — Z720 Tobacco use: Secondary | ICD-10-CM | POA: Insufficient documentation

## 2015-03-12 HISTORY — DX: Insomnia, unspecified: G47.00

## 2015-03-12 HISTORY — DX: Depression, unspecified: F32.A

## 2015-03-12 HISTORY — DX: Major depressive disorder, single episode, unspecified: F32.9

## 2015-03-12 LAB — URINALYSIS, ROUTINE W REFLEX MICROSCOPIC
Bilirubin Urine: NEGATIVE
Glucose, UA: NEGATIVE mg/dL
Hgb urine dipstick: NEGATIVE
Ketones, ur: NEGATIVE mg/dL
LEUKOCYTES UA: NEGATIVE
NITRITE: NEGATIVE
PROTEIN: NEGATIVE mg/dL
SPECIFIC GRAVITY, URINE: 1.011 (ref 1.005–1.030)
Urobilinogen, UA: 0.2 mg/dL (ref 0.0–1.0)
pH: 7 (ref 5.0–8.0)

## 2015-03-12 LAB — CBC WITH DIFFERENTIAL/PLATELET
Basophils Absolute: 0 10*3/uL (ref 0.0–0.1)
Basophils Relative: 0 % (ref 0–1)
Eosinophils Absolute: 0.1 10*3/uL (ref 0.0–0.7)
Eosinophils Relative: 1 % (ref 0–5)
HEMATOCRIT: 37.2 % (ref 36.0–46.0)
HEMOGLOBIN: 12.8 g/dL (ref 12.0–15.0)
LYMPHS ABS: 4.4 10*3/uL — AB (ref 0.7–4.0)
Lymphocytes Relative: 36 % (ref 12–46)
MCH: 30.3 pg (ref 26.0–34.0)
MCHC: 34.4 g/dL (ref 30.0–36.0)
MCV: 88.2 fL (ref 78.0–100.0)
Monocytes Absolute: 0.8 10*3/uL (ref 0.1–1.0)
Monocytes Relative: 7 % (ref 3–12)
NEUTROS ABS: 6.9 10*3/uL (ref 1.7–7.7)
Neutrophils Relative %: 56 % (ref 43–77)
PLATELETS: 503 10*3/uL — AB (ref 150–400)
RBC: 4.22 MIL/uL (ref 3.87–5.11)
RDW: 14.1 % (ref 11.5–15.5)
WBC: 12.3 10*3/uL — ABNORMAL HIGH (ref 4.0–10.5)

## 2015-03-12 LAB — POC URINE PREG, ED: Preg Test, Ur: NEGATIVE

## 2015-03-12 LAB — I-STAT CHEM 8, ED
BUN: 6 mg/dL (ref 6–23)
CREATININE: 0.6 mg/dL (ref 0.50–1.10)
Calcium, Ion: 1.1 mmol/L — ABNORMAL LOW (ref 1.12–1.23)
Chloride: 105 mmol/L (ref 96–112)
Glucose, Bld: 97 mg/dL (ref 70–99)
HEMATOCRIT: 41 % (ref 36.0–46.0)
HEMOGLOBIN: 13.9 g/dL (ref 12.0–15.0)
POTASSIUM: 3 mmol/L — AB (ref 3.5–5.1)
Sodium: 139 mmol/L (ref 135–145)
TCO2: 18 mmol/L (ref 0–100)

## 2015-03-12 MED ORDER — IOHEXOL 300 MG/ML  SOLN
50.0000 mL | Freq: Once | INTRAMUSCULAR | Status: AC | PRN
  Administered 2015-03-12: 50 mL via ORAL

## 2015-03-12 MED ORDER — POTASSIUM CHLORIDE 10 MEQ/100ML IV SOLN
10.0000 meq | INTRAVENOUS | Status: DC
  Administered 2015-03-12 (×2): 10 meq via INTRAVENOUS
  Filled 2015-03-12 (×2): qty 100

## 2015-03-12 MED ORDER — OXYCODONE-ACETAMINOPHEN 5-325 MG PO TABS: 1.0000 | ORAL_TABLET | Freq: Four times a day (QID) | ORAL | Status: DC | PRN

## 2015-03-12 MED ORDER — FENTANYL CITRATE 0.05 MG/ML IJ SOLN
INTRAMUSCULAR | Status: AC
  Administered 2015-03-12: 100 ug via INTRAVENOUS
  Filled 2015-03-12: qty 2

## 2015-03-12 MED ORDER — FENTANYL CITRATE 0.05 MG/ML IJ SOLN
100.0000 ug | Freq: Once | INTRAMUSCULAR | Status: AC
  Administered 2015-03-12: 100 ug via INTRAVENOUS

## 2015-03-12 MED ORDER — ONDANSETRON HCL 4 MG/2ML IJ SOLN
4.0000 mg | Freq: Once | INTRAMUSCULAR | Status: AC
  Administered 2015-03-12: 4 mg via INTRAVENOUS

## 2015-03-12 MED ORDER — HYDROMORPHONE HCL 1 MG/ML IJ SOLN
1.0000 mg | Freq: Once | INTRAMUSCULAR | Status: AC
  Administered 2015-03-12: 1 mg via INTRAVENOUS
  Filled 2015-03-12: qty 1

## 2015-03-12 MED ORDER — SODIUM CHLORIDE 0.9 % IV SOLN
INTRAVENOUS | Status: DC
  Administered 2015-03-12: 01:00:00 via INTRAVENOUS

## 2015-03-12 MED ORDER — ONDANSETRON HCL 4 MG/2ML IJ SOLN
INTRAMUSCULAR | Status: AC
  Administered 2015-03-12: 4 mg via INTRAVENOUS
  Filled 2015-03-12: qty 2

## 2015-03-12 MED ORDER — ONDANSETRON 8 MG PO TBDP: 8.0000 mg | ORAL_TABLET | Freq: Three times a day (TID) | ORAL | Status: DC | PRN

## 2015-03-12 MED ORDER — IOHEXOL 300 MG/ML  SOLN
100.0000 mL | Freq: Once | INTRAMUSCULAR | Status: AC | PRN
  Administered 2015-03-12: 100 mL via INTRAVENOUS

## 2015-03-12 NOTE — ED Notes (Signed)
Below order not completed by EW. 

## 2015-03-12 NOTE — ED Notes (Signed)
Patients mother is requesting additional pain medication. Provider made aware.

## 2015-03-12 NOTE — ED Provider Notes (Signed)
CSN: [id number]     Arrival date & time 03/12/15  0043 History   First MD Initiated Contact with Patient 03/12/15 0045     Chief Complaint  Patient presents with  . Abdominal Pain     (Consider location/radiation/quality/duration/timing/severity/associated sxs/prior Treatment) HPI  This is a 34 year old female who developed abdominal pain yesterday. The pain was initially insidious but gradually worsened. It began. Umbilical he and has migrated to the right lower quadrant today. About an hour prior to arrival the pain suddenly became severe. The pain is sharp and worse with movement or palpation. There is associated nausea but no vomiting. There has been some diarrhea. She has had a low-grade fever at home which improved with Advil. She was noted to have a temperature of 99.6 on arrival. She denies dysuria, vaginal discharge or abnormal vaginal bleeding though she is currently on her menses. She has had a decreased appetite today. EMS did not give her any medications en route. She had a near syncopal episode at home earlier during which she injured her left wrist.  Past Medical History  Diagnosis Date  . H/O varicella   . Gallstones   . 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 Anxiety     anxiety disorder  . Bulging lumbar disc   . Insomnia   . Depression    Past Surgical History  Procedure Laterality Date  . Cholecystectomy    . Cholecystectomy  07/11/2012    Procedure: LAPAROSCOPIC CHOLECYSTECTOMY WITH INTRAOPERATIVE CHOLANGIOGRAM;  Surgeon: Almond Lint, MD;  Location: [facility];  Service: General;  Laterality: N/A;  . Ercp  07/12/2012    Procedure: ENDOSCOPIC RETROGRADE CHOLANGIOPANCREATOGRAPHY (ERCP);  Surgeon: Petra Kuba, MD;  Location: [facility];  Service: Endoscopy;  Laterality: N/A;  . Esophagogastroduodenoscopy  08/01/2012    Procedure: ESOPHAGOGASTRODUODENOSCOPY (EGD);  Surgeon: Florencia Reasons, MD;  Location: [facility];  Service: Endoscopy;  Laterality: N/A;  .  Back surgery      will be her second  . Anterior lumbar fusion N/A 10/10/2014    Procedure: ANTERIOR LUMBAR FUSION 1 LEVEL;  Surgeon: Mariam Dollar, MD;  Location: [facility];  Service: Neurosurgery;  Laterality: N/A;  ANTERIOR LUMBAR FUSION 1 LEVEL LUMBAR 5-SACRAL 1  . Abdominal exposure N/A 10/10/2014    Procedure: ABDOMINAL EXPOSURE;  Surgeon: Larina Earthly, MD;  Location: [facility];  Service: Vascular;  Laterality: N/A;  ABDOMINAL EXPOSURE   Family History  Problem Relation Age of Onset  . Hypertension Mother   . Heart disease Maternal Grandfather    History  Substance Use Topics  . Smoking status: Current Every Day Smoker -- 0.50 packs/day for 12 years    Types: Cigarettes  . Smokeless tobacco: Never Used  . Alcohol Use: No   OB History    Gravida Para Term Preterm AB TAB SAB Ectopic Multiple Living   3 1 1  2  2   1      Review of Systems  All other systems reviewed and are negative.   Allergies  Questran  Home Medications   Prior to Admission medications   Medication Sig Start Date End Date Taking? Authorizing Provider  DULoxetine (CYMBALTA) 30 MG capsule Take 30 mg by mouth 2 (two) times daily.   Yes Historical Provider, MD  LORazepam (ATIVAN) 1 MG tablet Take 1 mg by mouth every 6 (six) hours as needed for anxiety.   Yes Historical Provider, MD  traZODone (DESYREL) 50 MG tablet Take 100 mg by mouth  at bedtime.   Yes Historical Provider, MD  diazepam (VALIUM) 5 MG tablet Take 5 mg by mouth every 12 (twelve) hours as needed for anxiety.    Historical Provider, MD  diazepam (VALIUM) 5 MG tablet Take 1 tablet (5 mg total) by mouth every 12 (twelve) hours as needed for anxiety. Patient not taking: Reported on 03/12/2015 10/11/14   Donalee Citrin, MD  guaiFENesin (MUCINEX) 600 MG 12 hr tablet Take 600 mg by mouth 2 (two) times daily as needed for cough or to loosen phlegm.    Historical Provider, MD  HYDROcodone-acetaminophen (HYCET) 7.5-325 mg/15 ml solution Take 15 mLs by  mouth 4 (four) times daily as needed for moderate pain. Patient not taking: Reported on 03/12/2015 12/16/14 12/16/15  Fayrene Helper, PA-C  ibuprofen (ADVIL,MOTRIN) 200 MG tablet Take 200 mg by mouth every 6 (six) hours as needed (pain.).    Historical Provider, MD  levofloxacin (LEVAQUIN) 750 MG tablet Take 1 tablet (750 mg total) by mouth daily. Patient not taking: Reported on 03/12/2015 12/16/14   Fayrene Helper, PA-C  meloxicam (MOBIC) 7.5 MG tablet Take 7.5 mg by mouth daily.    Historical Provider, MD  oxyCODONE-acetaminophen (PERCOCET/ROXICET) 5-325 MG per tablet Take 1 tablet by mouth every 6 (six) hours as needed for severe pain (pain).    Historical Provider, MD  oxyCODONE-acetaminophen (PERCOCET/ROXICET) 5-325 MG per tablet Take 1-2 tablets by mouth every 4 (four) hours as needed for moderate pain. 10/11/14   Donalee Citrin, MD  Pseudoeph-Doxylamine-DM-APAP (NYQUIL PO) Take 1 tablet by mouth daily as needed (cough/cold symptoms.).    Historical Provider, MD   BP 96/55 mmHg  Pulse 87  Temp(Src) 98.6 F (37 C) (Oral)  Resp 20  Ht  (1.676 m)  Wt 175 lb (79.379 kg)  BMI 28.26 kg/m2  SpO2 96%  LMP 03/10/2015   Physical Exam  General: Well-developed, well-nourished female, appears uncomfortable; appearance consistent with age of record HENT: normocephalic; atraumatic Eyes: pupils equal, round and reactive to light; extraocular muscles intact Neck: supple Heart: regular rate and rhythm Lungs: clear to auscultation bilaterally Abdomen: soft; nondistended; severe right lower quadrant tenderness; no masses or hepatosplenomegaly; bowel sounds present Extremities: No deformity; full range of motion; pulses normal; tenderness of dorsal left wrist with snuffbox tenderness Neurologic: Awake, alert and oriented; motor function intact in all extremities and symmetric; no facial droop Skin: Warm and dry; superficial abrasions to dorsal left hand Psychiatric: Anxious; mildly agitated due to pain    ED  Course  Procedures (including critical care time)   MDM  Nursing notes and vitals signs, including pulse oximetry, reviewed.  Summary of this visit's results, reviewed by myself:  Labs:  Results for orders placed or performed during the hospital encounter of 03/12/15 (from the past 24 hour(s))  CBC with Differential/Platelet     Status: Abnormal   Collection Time: 03/12/15 12:46 AM  Result Value Ref Range   WBC 12.3 (H) 4.0 - 10.5 K/uL   RBC 4.22 3.87 - 5.11 MIL/uL   Hemoglobin 12.8 12.0 - 15.0 g/dL   HCT 16.1 09.6 - 04.5 %   MCV 88.2 78.0 - 100.0 fL   MCH 30.3 26.0 - 34.0 pg   MCHC 34.4 30.0 - 36.0 g/dL   RDW 40.9 81.1 - 91.4 %   Platelets 503 (H) 150 - 400 K/uL   Neutrophils Relative % 56 43 - 77 %   Neutro Abs 6.9 1.7 - 7.7 K/uL   Lymphocytes Relative 36 12 -  46 %   Lymphs Abs 4.4 (H) 0.7 - 4.0 K/uL   Monocytes Relative 7 3 - 12 %   Monocytes Absolute 0.8 0.1 - 1.0 K/uL   Eosinophils Relative 1 0 - 5 %   Eosinophils Absolute 0.1 0.0 - 0.7 K/uL   Basophils Relative 0 0 - 1 %   Basophils Absolute 0.0 0.0 - 0.1 K/uL  I-Stat Chem 8, ED     Status: Abnormal   Collection Time: 03-28-15  1:51 AM  Result Value Ref Range   Sodium 139 135 - 145 mmol/L   Potassium 3.0 (L) 3.5 - 5.1 mmol/L   Chloride 105 96 - 112 mmol/L   BUN 6 6 - 23 mg/dL   Creatinine, Ser 1.61 0.50 - 1.10 mg/dL   Glucose, Bld 97 70 - 99 mg/dL   Calcium, Ion 0.96 (L) 1.12 - 1.23 mmol/L   TCO2 18 0 - 100 mmol/L   Hemoglobin 13.9 12.0 - 15.0 g/dL   HCT 04.5 40.9 - 81.1 %  Urinalysis, Routine w reflex microscopic     Status: None   Collection Time: 03/28/15  2:23 AM  Result Value Ref Range   Color, Urine YELLOW YELLOW   APPearance CLEAR CLEAR   Specific Gravity, Urine 1.011 1.005 - 1.030   pH 7.0 5.0 - 8.0   Glucose, UA NEGATIVE NEGATIVE mg/dL   Hgb urine dipstick NEGATIVE NEGATIVE   Bilirubin Urine NEGATIVE NEGATIVE   Ketones, ur NEGATIVE NEGATIVE mg/dL   Protein, ur NEGATIVE NEGATIVE mg/dL    Urobilinogen, UA 0.2 0.0 - 1.0 mg/dL   Nitrite NEGATIVE NEGATIVE   Leukocytes, UA NEGATIVE NEGATIVE  POC Urine Pregnancy, ED (do NOT order at [facility])     Status: None   Collection Time: 2015/03/28  2:32 AM  Result Value Ref Range   Preg Test, Ur NEGATIVE NEGATIVE    Imaging Studies: Dg Wrist Complete Left  03/28/2015   CLINICAL DATA:  Left wrist pain after fall earlier last evening.  EXAM: LEFT WRIST - COMPLETE 3+ VIEW  COMPARISON:  10/08/2009  FINDINGS: No fracture or dislocation. The alignment and joint spaces are maintained. Scaphoid is intact. Minimal cystic change in the ulna styloid. No focal soft tissue abnormality.  IMPRESSION: No fracture or dislocation of the left wrist.   Electronically Signed   By: Rubye Oaks M.D.   On: 03/28/15 02:40   Ct Abdomen Pelvis W Contrast  Mar 28, 2015   CLINICAL DATA:  Acute onset of right lower quadrant abdominal pain. Initial encounter.  EXAM: CT ABDOMEN AND PELVIS WITH CONTRAST  TECHNIQUE: Multidetector CT imaging of the abdomen and pelvis was performed using the standard protocol following bolus administration of intravenous contrast.  CONTRAST:  OMNIPAQUE IOHEXOL 300 MG/ML  SOLN  COMPARISON:  Pelvic ultrasound performed 07/01/2014, and CT of the abdomen and pelvis performed 09/21/2012  FINDINGS: Minimal right basilar atelectasis is noted.  The liver and spleen are unremarkable in appearance. The patient is status post cholecystectomy, with clips noted along the gallbladder fossa. The pancreas and adrenal glands are unremarkable.  The kidneys are unremarkable in appearance. There is no evidence of hydronephrosis. No renal or ureteral stones are seen. No perinephric stranding is appreciated.  No free fluid is identified. The small bowel is unremarkable in appearance. The stomach is within normal limits. No acute vascular abnormalities are seen.  The appendix is borderline normal in size, without definite evidence of appendicitis. The colon is  unremarkable in appearance.  The bladder is decompressed  and not well assessed. The uterus is unremarkable in appearance. A tampon is noted at the vagina. The ovaries are relatively symmetric. No suspicious adnexal masses are seen. No inguinal lymphadenopathy is seen.  No acute osseous abnormalities are identified. The patient is status post anterior lumbar spinal fusion at L5-S1, with mild associated degenerative change.  IMPRESSION: No acute abnormality seen within the abdomen or pelvis. Appendix is borderline normal in size.   Electronically Signed   By: Roanna RaiderJeffery  Chang M.D.   On: 03/12/2015 03:00   4:50 AM Patient advised of lab and CT findings. She was advised that although there was no acute appendicitis seen that early appendicitis cannot be ruled out. Her abdomen is soft and nondistended. She still has right lower quadrant tenderness. If her pain worsens or changes over the next 12 hours she triggered return for reevaluation. We will also splint her left wrist for possible occult navicular fracture.     Paula LibraJohn Daly Whipkey, MD 03/12/15 [phone]

## 2015-03-12 NOTE — ED Notes (Signed)
Per EMS, pt is from home and started experiencing right lower quad pain that got worse about an hour ago. Nausea and diarrhea associated with chief complaint. At home, pt's temp was 99.5. Took Advil for pian and fever. Advil helped with fever but nothing with pain.

## 2015-03-12 NOTE — ED Notes (Signed)
Bed: WA15 Expected date:  Expected time:  Means of arrival:  Comments: abd pain  

## 2015-03-17 ENCOUNTER — Emergency Department (HOSPITAL_COMMUNITY)
Admission: EM | Admit: 2015-03-17 | Discharge: 2015-03-17 | Disposition: A | Discharge status: Home / Self Care | Payer: Medicaid Other | Attending: Emergency Medicine | Admitting: Emergency Medicine

## 2015-03-17 ENCOUNTER — Emergency Department (HOSPITAL_COMMUNITY): Payer: Medicaid Other

## 2015-03-17 ENCOUNTER — Encounter (HOSPITAL_COMMUNITY): Payer: Self-pay

## 2015-03-17 DIAGNOSIS — Z8619 Personal history of other infectious and parasitic diseases: Secondary | ICD-10-CM | POA: Diagnosis not present

## 2015-03-17 DIAGNOSIS — F329 Major depressive disorder, single episode, unspecified: Secondary | ICD-10-CM | POA: Insufficient documentation

## 2015-03-17 DIAGNOSIS — Z9049 Acquired absence of other specified parts of digestive tract: Secondary | ICD-10-CM | POA: Diagnosis not present

## 2015-03-17 DIAGNOSIS — Z8739 Personal history of other diseases of the musculoskeletal system and connective tissue: Secondary | ICD-10-CM | POA: Diagnosis not present

## 2015-03-17 DIAGNOSIS — F419 Anxiety disorder, unspecified: Secondary | ICD-10-CM | POA: Diagnosis not present

## 2015-03-17 DIAGNOSIS — R102 Pelvic and perineal pain: Secondary | ICD-10-CM | POA: Diagnosis not present

## 2015-03-17 DIAGNOSIS — Z79899 Other long term (current) drug therapy: Secondary | ICD-10-CM | POA: Diagnosis not present

## 2015-03-17 DIAGNOSIS — G4700 Insomnia, unspecified: Secondary | ICD-10-CM | POA: Diagnosis not present

## 2015-03-17 DIAGNOSIS — Z791 Long term (current) use of non-steroidal anti-inflammatories (NSAID): Secondary | ICD-10-CM | POA: Insufficient documentation

## 2015-03-17 DIAGNOSIS — Z3202 Encounter for pregnancy test, result negative: Secondary | ICD-10-CM | POA: Insufficient documentation

## 2015-03-17 DIAGNOSIS — Z8719 Personal history of other diseases of the digestive system: Secondary | ICD-10-CM | POA: Diagnosis not present

## 2015-03-17 DIAGNOSIS — R1031 Right lower quadrant pain: Secondary | ICD-10-CM | POA: Diagnosis present

## 2015-03-17 DIAGNOSIS — Z720 Tobacco use: Secondary | ICD-10-CM | POA: Insufficient documentation

## 2015-03-17 LAB — CBC WITH DIFFERENTIAL/PLATELET
Basophils Absolute: 0 10*3/uL (ref 0.0–0.1)
Basophils Relative: 0 % (ref 0–1)
EOS ABS: 0 10*3/uL (ref 0.0–0.7)
EOS PCT: 0 % (ref 0–5)
HCT: 42.4 % (ref 36.0–46.0)
Hemoglobin: 14.2 g/dL (ref 12.0–15.0)
Lymphocytes Relative: 13 % (ref 12–46)
Lymphs Abs: 1.4 10*3/uL (ref 0.7–4.0)
MCH: 29.3 pg (ref 26.0–34.0)
MCHC: 33.5 g/dL (ref 30.0–36.0)
MCV: 87.6 fL (ref 78.0–100.0)
Monocytes Absolute: 0.6 10*3/uL (ref 0.1–1.0)
Monocytes Relative: 6 % (ref 3–12)
Neutro Abs: 8.5 10*3/uL — ABNORMAL HIGH (ref 1.7–7.7)
Neutrophils Relative %: 81 % — ABNORMAL HIGH (ref 43–77)
Platelets: 508 10*3/uL — ABNORMAL HIGH (ref 150–400)
RBC: 4.84 MIL/uL (ref 3.87–5.11)
RDW: 14 % (ref 11.5–15.5)
WBC: 10.6 10*3/uL — AB (ref 4.0–10.5)

## 2015-03-17 LAB — BASIC METABOLIC PANEL
Anion gap: 10 (ref 5–15)
BUN: 5 mg/dL — AB (ref 6–23)
CALCIUM: 9.8 mg/dL (ref 8.4–10.5)
CO2: 25 mmol/L (ref 19–32)
Chloride: 103 mmol/L (ref 96–112)
Creatinine, Ser: 0.82 mg/dL (ref 0.50–1.10)
GFR calc non Af Amer: >90 mL/min (ref >90)
GLUCOSE: 110 mg/dL — AB (ref 70–99)
Potassium: 4 mmol/L (ref 3.5–5.1)
SODIUM: 138 mmol/L (ref 135–145)

## 2015-03-17 LAB — URINALYSIS, ROUTINE W REFLEX MICROSCOPIC
Bilirubin Urine: NEGATIVE
Glucose, UA: NEGATIVE mg/dL
Hgb urine dipstick: NEGATIVE
Ketones, ur: NEGATIVE mg/dL
Leukocytes, UA: NEGATIVE
NITRITE: NEGATIVE
PROTEIN: NEGATIVE mg/dL
Specific Gravity, Urine: 1.014 (ref 1.005–1.030)
UROBILINOGEN UA: 0.2 mg/dL (ref 0.0–1.0)
pH: 8 (ref 5.0–8.0)

## 2015-03-17 LAB — WET PREP, GENITAL
CLUE CELLS WET PREP: NONE SEEN
TRICH WET PREP: NONE SEEN
Yeast Wet Prep HPF POC: NONE SEEN

## 2015-03-17 LAB — I-STAT BETA HCG BLOOD, ED (MC, WL, AP ONLY): I-stat hCG, quantitative: <5 m[IU]/mL (ref <5)

## 2015-03-17 MED ORDER — HYDROMORPHONE HCL 1 MG/ML IJ SOLN
1.0000 mg | Freq: Once | INTRAMUSCULAR | Status: AC
  Administered 2015-03-17: 1 mg via INTRAVENOUS
  Filled 2015-03-17: qty 1

## 2015-03-17 MED ORDER — LORAZEPAM 2 MG/ML IJ SOLN
1.0000 mg | Freq: Once | INTRAMUSCULAR | Status: AC
  Administered 2015-03-17: 1 mg via INTRAVENOUS
  Filled 2015-03-17: qty 1

## 2015-03-17 MED ORDER — SODIUM CHLORIDE 0.9 % IV SOLN
INTRAVENOUS | Status: DC
  Administered 2015-03-17: 13:00:00 via INTRAVENOUS

## 2015-03-17 MED ORDER — OXYCODONE-ACETAMINOPHEN 5-325 MG PO TABS: 1.0000 | ORAL_TABLET | ORAL | Status: DC | PRN

## 2015-03-17 MED ORDER — KETOROLAC TROMETHAMINE 30 MG/ML IJ SOLN
30.0000 mg | Freq: Once | INTRAMUSCULAR | Status: AC
  Administered 2015-03-17: 30 mg via INTRAVENOUS
  Filled 2015-03-17: qty 1

## 2015-03-17 NOTE — ED Notes (Signed)
Pt reports right sided flank pain that radiates to groin x 1 week. States she was seen for same earlier this week and dx with ovarian cyst. States pain continues with nausea. Pt denies hematuria, dysuria. Pt reports taking 5mg  Oxycodone at 0600 today with minimal relief. NAD.

## 2015-03-17 NOTE — ED Provider Notes (Signed)
CSN: [id number]     Arrival date & time 03/17/15  1037 History   First MD Initiated Contact with Patient 03/17/15 1131     Chief Complaint  Patient presents with  . Flank Pain     (Consider location/radiation/quality/duration/timing/severity/associated sxs/prior Treatment) HPI Comments: Patient here complaining of acute onset of right lower quadrant pain that began this morning when she awoke. Seen for similar symptoms 3 days ago and had evaluation including abdominal CT which was negative for appendicitis. She is also status post cholecystectomy. Pain characterized as sharp and severe. Had associated nausea but no fever or chills. Took Zofran as well as oxycodone with slight relief. Does have a history of ovarian cyst in the past. Denies any vaginal bleeding or discharge. Last pressure. Was a week ago. Symptoms persistent and nothing makes them better.  Patient is a 34 y.o. female presenting with flank pain. The history is provided by the patient.  Flank Pain    Past Medical History  Diagnosis Date  . H/O varicella   . Gallstones   . 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 Anxiety     anxiety disorder  . Bulging lumbar disc   . Insomnia   . Depression    Past Surgical History  Procedure Laterality Date  . Cholecystectomy    . Cholecystectomy  07/11/2012    Procedure: LAPAROSCOPIC CHOLECYSTECTOMY WITH INTRAOPERATIVE CHOLANGIOGRAM;  Surgeon: Almond LintFaera Byerly, MD;  Location: [facility];  Service: General;  Laterality: N/A;  . Ercp  07/12/2012    Procedure: ENDOSCOPIC RETROGRADE CHOLANGIOPANCREATOGRAPHY (ERCP);  Surgeon: Petra KubaMarc E Magod, MD;  [facility];  Service: Endoscopy;  Laterality: N/A;  . Esophagogastroduodenoscopy  08/01/2012    Procedure: ESOPHAGOGASTRODUODENOSCOPY (EGD);  Surgeon: Florencia Reasonsobert V Buccini, MD;  [facility];  Service: Endoscopy;  Laterality: N/A;  . Back surgery      will be her second  . Anterior lumbar fusion N/A 10/10/2014    Procedure: ANTERIOR  LUMBAR FUSION 1 LEVEL;  Surgeon: Mariam DollarGary P Cram, MD;  Location: [facility];  Service: Neurosurgery;  Laterality: N/A;  ANTERIOR LUMBAR FUSION 1 LEVEL LUMBAR 5-SACRAL 1  . Abdominal exposure N/A 10/10/2014    Procedure: ABDOMINAL EXPOSURE;  Surgeon: Larina Earthlyodd F Early, MD;  Location: [facility];  Service: Vascular;  Laterality: N/A;  ABDOMINAL EXPOSURE   Family History  Problem Relation Age of Onset  . Hypertension Mother   . Heart disease Maternal Grandfather    History  Substance Use Topics  . Smoking status: Current Every Day Smoker -- 0.50 packs/day for 12 years    Types: Cigarettes  . Smokeless tobacco: Never Used  . Alcohol Use: No   OB History    Gravida Para Term Preterm AB TAB SAB Ectopic Multiple Living   3 1 1  2  2   1      Review of Systems  Genitourinary: Positive for flank pain.  All other systems reviewed and are negative.     Allergies  Questran  Home Medications   Prior to Admission medications   Medication Sig Start Date End Date Taking? Authorizing Provider  diazepam (VALIUM) 5 MG tablet Take 5 mg by mouth every 12 (twelve) hours as needed for anxiety.    Historical Provider, MD  DULoxetine (CYMBALTA) 30 MG capsule Take 30 mg by mouth 2 (two) times daily.    Historical Provider, MD  guaiFENesin (MUCINEX) 600 MG 12 hr tablet Take 600 mg by mouth 2 (two) times daily as needed for cough  or to loosen phlegm.    Historical Provider, MD  ibuprofen (ADVIL,MOTRIN) 200 MG tablet Take 200 mg by mouth every 6 (six) hours as needed (pain.).    Historical Provider, MD  LORazepam (ATIVAN) 1 MG tablet Take 1 mg by mouth every 6 (six) hours as needed for anxiety.    Historical Provider, MD  meloxicam (MOBIC) 7.5 MG tablet Take 7.5 mg by mouth daily.    Historical Provider, MD  ondansetron (ZOFRAN ODT) 8 MG disintegrating tablet Take 1 tablet (8 mg total) by mouth every 8 (eight) hours as needed for nausea or vomiting.  ODT q4 hours prn nausea 03/12/15   Paula Libra, MD    oxyCODONE-acetaminophen (PERCOCET) 5-325 MG per tablet Take 1-2 tablets by mouth every 6 (six) hours as needed (for pain). 03/12/15   John Molpus, MD  Pseudoeph-Doxylamine-DM-APAP (NYQUIL PO) Take 1 tablet by mouth daily as needed (cough/cold symptoms.).    Historical Provider, MD  traZODone (DESYREL) 50 MG tablet Take 100 mg by mouth at bedtime.    Historical Provider, MD   BP 104/88 mmHg  Pulse 94  Temp(Src) 98.3 F (36.8 C) (Oral)  Resp 20  SpO2 98%  LMP 03/10/2015 Physical Exam  Constitutional: She is oriented to person, place, and time. She appears well-developed and well-nourished.  Non-toxic appearance. No distress.  HENT:  Head: Normocephalic and atraumatic.  Eyes: Conjunctivae, EOM and lids are normal. Pupils are equal, round, and reactive to light.  Neck: Normal range of motion. Neck supple. No tracheal deviation present. No thyroid mass present.  Cardiovascular: Normal rate, regular rhythm and normal heart sounds.  Exam reveals no gallop.   No murmur heard. Pulmonary/Chest: Effort normal and breath sounds normal. No stridor. No respiratory distress. She has no decreased breath sounds. She has no wheezes. She has no rhonchi. She has no rales.  Abdominal: Soft. Normal appearance and bowel sounds are normal. She exhibits no distension. There is tenderness in the right lower quadrant. There is guarding. There is no rigidity, no rebound and no CVA tenderness.    Musculoskeletal: Normal range of motion. She exhibits no edema or tenderness.  Neurological: She is alert and oriented to person, place, and time. She has normal strength. No cranial nerve deficit or sensory deficit. GCS eye subscore is 4. GCS verbal subscore is 5. GCS motor subscore is 6.  Skin: Skin is warm and dry. No abrasion and no rash noted.  Psychiatric: She has a normal mood and affect. Her speech is normal and behavior is normal.  Nursing note and vitals reviewed.   ED Course  Procedures (including critical  care time) Labs Review Labs Reviewed  CBC WITH DIFFERENTIAL/PLATELET  BASIC METABOLIC PANEL  URINALYSIS, ROUTINE W REFLEX MICROSCOPIC  I-STAT BETA HCG BLOOD, ED ([facility], [facility], [facility] ONLY)    Imaging Review No results found.   EKG Interpretation None      MDM   Final diagnoses:  Pelvic pain in female    Patient given pain meds and feels better. Repeat exam shows no evidence of surgical abdomen at this time. States her symptoms have been for the past week. In light of her recent abdominal CT as well as white blood cell count of less than 15,000 and she is afebrile a do not think that this is an appendicitis. We'll treat her with pain meds and she will follow-up with her gynecologist    Lorre Nick, MD 03/17/15 1614

## 2015-03-17 NOTE — Discharge Instructions (Signed)
Follow-up with your gynecologist next week   Pelvic Pain Female pelvic pain can be caused by many different things and start from a variety of places. Pelvic pain refers to pain that is located in the lower half of the abdomen and between your hips. The pain may occur over a short period of time (acute) or may be reoccurring (chronic). The cause of pelvic pain may be related to disorders affecting the female reproductive organs (gynecologic), but it may also be related to the bladder, kidney stones, an intestinal complication, or muscle or skeletal problems. Getting help right away for pelvic pain is important, especially if there has been severe, sharp, or a sudden onset of unusual pain. It is also important to get help right away because some types of pelvic pain can be life threatening.  CAUSES  Below are only some of the causes of pelvic pain. The causes of pelvic pain can be in one of several categories.   Gynecologic.  Pelvic inflammatory disease.  Sexually transmitted infection.  Ovarian cyst or a twisted ovarian ligament (ovarian torsion).  Uterine lining that grows outside the uterus (endometriosis).  Fibroids, cysts, or tumors.  Ovulation.  Pregnancy.  Pregnancy that occurs outside the uterus (ectopic pregnancy).  Miscarriage.  Labor.  Abruption of the placenta or ruptured uterus.  Infection.  Uterine infection (endometritis).  Bladder infection.  Diverticulitis.  Miscarriage related to a uterine infection (septic abortion).  Bladder.  Inflammation of the bladder (cystitis).  Kidney stone(s).  Gastrointestinal.  Constipation.  Diverticulitis.  Neurologic.  Trauma.  Feeling pelvic pain because of mental or emotional causes (psychosomatic).  Cancers of the bowel or pelvis. EVALUATION  Your caregiver will want to take a careful history of your concerns. This includes recent changes in your health, a careful gynecologic history of your periods  (menses), and a sexual history. Obtaining your family history and medical history is also important. Your caregiver may suggest a pelvic exam. A pelvic exam will help identify the location and severity of the pain. It also helps in the evaluation of which organ system may be involved. In order to identify the cause of the pelvic pain and be properly treated, your caregiver may order tests. These tests may include:   A pregnancy test.  Pelvic ultrasonography.  An X-ray exam of the abdomen.  A urinalysis or evaluation of vaginal discharge.  Blood tests. HOME CARE INSTRUCTIONS   Only take over-the-counter or prescription medicines for pain, discomfort, or fever as directed by your caregiver.   Rest as directed by your caregiver.   Eat a balanced diet.   Drink enough fluids to make your urine clear or pale yellow, or as directed.   Avoid sexual intercourse if it causes pain.   Apply warm or cold compresses to the lower abdomen depending on which one helps the pain.   Avoid stressful situations.   Keep a journal of your pelvic pain. Write down when it started, where the pain is located, and if there are things that seem to be associated with the pain, such as food or your menstrual cycle.  Follow up with your caregiver as directed.  SEEK MEDICAL CARE IF:  Your medicine does not help your pain.  You have abnormal vaginal discharge. SEEK IMMEDIATE MEDICAL CARE IF:   You have heavy bleeding from the vagina.   Your pelvic pain increases.   You feel light-headed or faint.   You have chills.   You have pain with urination or blood in  your urine.   You have uncontrolled diarrhea or vomiting.   You have a fever or persistent symptoms for more than 3 days.  You have a fever and your symptoms suddenly get worse.   You are being physically or sexually abused.  MAKE SURE YOU:  Understand these instructions.  Will watch your condition.  Will get help if you  are not doing well or get worse. Document Released: 10/27/2004 Document Revised: 04/16/2014 Document Reviewed: 03/21/2012 [facility] Patient Information 2015 [place]. This information is not intended to replace advice given to you by your health care provider. Make sure you discuss any questions you have with your health care provider.

## 2015-03-18 LAB — GC/CHLAMYDIA PROBE AMP (~~LOC~~) NOT AT ARMC
CHLAMYDIA, DNA PROBE: NEGATIVE
NEISSERIA GONORRHEA: NEGATIVE

## 2015-05-23 ENCOUNTER — Encounter (HOSPITAL_COMMUNITY): Payer: Self-pay | Admitting: General Surgery

## 2017-01-18 ENCOUNTER — Other Ambulatory Visit: Payer: Self-pay | Admitting: Neurosurgery

## 2017-01-18 DIAGNOSIS — M5416 Radiculopathy, lumbar region: Secondary | ICD-10-CM

## 2017-01-19 ENCOUNTER — Encounter (HOSPITAL_COMMUNITY): Payer: Self-pay | Admitting: *Deleted

## 2017-01-19 ENCOUNTER — Ambulatory Visit
Admission: RE | Admit: 2017-01-19 | Discharge: 2017-01-19 | Disposition: A | Discharge status: Home / Self Care | Payer: Medicaid Other | Source: Ambulatory Visit | Attending: Neurosurgery | Admitting: Neurosurgery

## 2017-01-19 ENCOUNTER — Other Ambulatory Visit: Payer: Self-pay | Admitting: Neurosurgery

## 2017-01-19 DIAGNOSIS — M5416 Radiculopathy, lumbar region: Secondary | ICD-10-CM

## 2017-01-20 ENCOUNTER — Ambulatory Visit (HOSPITAL_COMMUNITY): Payer: Medicaid Other | Admitting: Certified Registered Nurse Anesthetist

## 2017-01-20 ENCOUNTER — Encounter (HOSPITAL_COMMUNITY)
Admission: RE | Disposition: A | Discharge status: Home / Self Care | Payer: Self-pay | Source: Ambulatory Visit | Attending: Neurosurgery

## 2017-01-20 ENCOUNTER — Ambulatory Visit (HOSPITAL_COMMUNITY): Payer: Medicaid Other

## 2017-01-20 ENCOUNTER — Ambulatory Visit (HOSPITAL_COMMUNITY)
Admission: RE | Admit: 2017-01-20 | Discharge: 2017-01-20 | Disposition: A | Discharge status: Home / Self Care | Payer: Medicaid Other | Source: Ambulatory Visit | Attending: Neurosurgery | Admitting: Neurosurgery

## 2017-01-20 ENCOUNTER — Encounter (HOSPITAL_COMMUNITY): Payer: Self-pay | Admitting: Certified Registered Nurse Anesthetist

## 2017-01-20 DIAGNOSIS — G4700 Insomnia, unspecified: Secondary | ICD-10-CM | POA: Diagnosis not present

## 2017-01-20 DIAGNOSIS — M5442 Lumbago with sciatica, left side: Secondary | ICD-10-CM

## 2017-01-20 DIAGNOSIS — M1990 Unspecified osteoarthritis, unspecified site: Secondary | ICD-10-CM | POA: Diagnosis not present

## 2017-01-20 DIAGNOSIS — Z888 Allergy status to other drugs, medicaments and biological substances status: Secondary | ICD-10-CM | POA: Insufficient documentation

## 2017-01-20 DIAGNOSIS — F329 Major depressive disorder, single episode, unspecified: Secondary | ICD-10-CM | POA: Diagnosis not present

## 2017-01-20 DIAGNOSIS — F17210 Nicotine dependence, cigarettes, uncomplicated: Secondary | ICD-10-CM | POA: Diagnosis not present

## 2017-01-20 DIAGNOSIS — M5126 Other intervertebral disc displacement, lumbar region: Secondary | ICD-10-CM | POA: Diagnosis not present

## 2017-01-20 DIAGNOSIS — F419 Anxiety disorder, unspecified: Secondary | ICD-10-CM | POA: Insufficient documentation

## 2017-01-20 DIAGNOSIS — M21372 Foot drop, left foot: Secondary | ICD-10-CM | POA: Insufficient documentation

## 2017-01-20 DIAGNOSIS — Z7982 Long term (current) use of aspirin: Secondary | ICD-10-CM | POA: Diagnosis not present

## 2017-01-20 DIAGNOSIS — M5137 Other intervertebral disc degeneration, lumbosacral region: Secondary | ICD-10-CM

## 2017-01-20 DIAGNOSIS — Z419 Encounter for procedure for purposes other than remedying health state, unspecified: Secondary | ICD-10-CM

## 2017-01-20 HISTORY — PX: LUMBAR LAMINECTOMY/DECOMPRESSION MICRODISCECTOMY: SHX5026

## 2017-01-20 HISTORY — DX: Pneumonia, unspecified organism: J18.9

## 2017-01-20 LAB — CBC
HCT: 39.6 % (ref 36.0–46.0)
Hemoglobin: 13.2 g/dL (ref 12.0–15.0)
MCH: 28.5 pg (ref 26.0–34.0)
MCHC: 33.3 g/dL (ref 30.0–36.0)
MCV: 85.5 fL (ref 78.0–100.0)
PLATELETS: 433 10*3/uL — AB (ref 150–400)
RBC: 4.63 MIL/uL (ref 3.87–5.11)
RDW: 13.9 % (ref 11.5–15.5)
WBC: 17.9 10*3/uL — AB (ref 4.0–10.5)

## 2017-01-20 LAB — SURGICAL PCR SCREEN
MRSA, PCR: NEGATIVE
STAPHYLOCOCCUS AUREUS: NEGATIVE

## 2017-01-20 LAB — HCG, SERUM, QUALITATIVE: Preg, Serum: NEGATIVE

## 2017-01-20 SURGERY — LUMBAR LAMINECTOMY/DECOMPRESSION MICRODISCECTOMY 1 LEVEL
Anesthesia: General | Site: Spine Lumbar | Laterality: Left

## 2017-01-20 MED ORDER — LIDOCAINE-EPINEPHRINE (PF) 2 %-1:200000 IJ SOLN
INTRAMUSCULAR | Status: AC
  Filled 2017-01-20: qty 20

## 2017-01-20 MED ORDER — OXYCODONE-ACETAMINOPHEN 5-325 MG PO TABS: 1.0000 | ORAL_TABLET | ORAL | 0 refills | Status: DC | PRN

## 2017-01-20 MED ORDER — PANTOPRAZOLE SODIUM 40 MG IV SOLR: 40.0000 mg | Freq: Every day | INTRAVENOUS | Status: DC

## 2017-01-20 MED ORDER — HYDROMORPHONE HCL 1 MG/ML IJ SOLN
0.5000 mg | INTRAMUSCULAR | Status: DC | PRN
  Administered 2017-01-20: 1 mg via INTRAVENOUS
  Filled 2017-01-20: qty 1

## 2017-01-20 MED ORDER — METHYLPREDNISOLONE 4 MG PO TBPK: 4.0000 mg | ORAL_TABLET | ORAL | Status: DC

## 2017-01-20 MED ORDER — FENTANYL CITRATE (PF) 100 MCG/2ML IJ SOLN
INTRAMUSCULAR | Status: AC
  Filled 2017-01-20: qty 2

## 2017-01-20 MED ORDER — LACTATED RINGERS IV SOLN
INTRAVENOUS | Status: DC | PRN
  Administered 2017-01-20 (×2): via INTRAVENOUS

## 2017-01-20 MED ORDER — ACETAMINOPHEN 650 MG RE SUPP: 650.0000 mg | RECTAL | Status: DC | PRN

## 2017-01-20 MED ORDER — SODIUM CHLORIDE 0.9% FLUSH: 3.0000 mL | Freq: Two times a day (BID) | INTRAVENOUS | Status: DC

## 2017-01-20 MED ORDER — LIDOCAINE 2% (20 MG/ML) 5 ML SYRINGE
INTRAMUSCULAR | Status: AC
  Filled 2017-01-20: qty 10

## 2017-01-20 MED ORDER — CEFAZOLIN SODIUM 1 G IJ SOLR
INTRAMUSCULAR | Status: DC | PRN
  Administered 2017-01-20: 2 g via INTRAMUSCULAR

## 2017-01-20 MED ORDER — OXYCODONE-ACETAMINOPHEN 5-325 MG PO TABS
ORAL_TABLET | ORAL | Status: AC
  Administered 2017-01-20: 2 via ORAL
  Filled 2017-01-20: qty 2

## 2017-01-20 MED ORDER — ASPIRIN 325 MG PO TABS
325.0000 mg | ORAL_TABLET | Freq: Two times a day (BID) | ORAL | Status: DC | PRN
Start: 2017-01-20 — End: 2017-01-20
  Filled 2017-01-20: qty 1

## 2017-01-20 MED ORDER — SODIUM CHLORIDE 0.9 % IR SOLN
Status: DC | PRN
  Administered 2017-01-20: 10:00:00

## 2017-01-20 MED ORDER — CEFAZOLIN SODIUM 1 G IJ SOLR
INTRAMUSCULAR | Status: AC
  Filled 2017-01-20: qty 20

## 2017-01-20 MED ORDER — PHENYLEPHRINE 40 MCG/ML (10ML) SYRINGE FOR IV PUSH (FOR BLOOD PRESSURE SUPPORT)
PREFILLED_SYRINGE | INTRAVENOUS | Status: AC
  Filled 2017-01-20: qty 10

## 2017-01-20 MED ORDER — ACETAMINOPHEN 10 MG/ML IV SOLN
INTRAVENOUS | Status: DC | PRN
  Administered 2017-01-20: 1000 mg via INTRAVENOUS

## 2017-01-20 MED ORDER — SODIUM CHLORIDE 0.9% FLUSH: 3.0000 mL | INTRAVENOUS | Status: DC | PRN

## 2017-01-20 MED ORDER — METHYLPREDNISOLONE 4 MG PO TABS: 12.0000 mg | ORAL_TABLET | Freq: Every day | ORAL | Status: DC

## 2017-01-20 MED ORDER — ACETAMINOPHEN 10 MG/ML IV SOLN
INTRAVENOUS | Status: AC
  Filled 2017-01-20: qty 100

## 2017-01-20 MED ORDER — THROMBIN 5000 UNITS EX SOLR
CUTANEOUS | Status: DC | PRN
  Administered 2017-01-20 (×2): 5000 [IU] via TOPICAL

## 2017-01-20 MED ORDER — ROCURONIUM BROMIDE 100 MG/10ML IV SOLN
INTRAVENOUS | Status: DC | PRN
  Administered 2017-01-20: 50 mg via INTRAVENOUS

## 2017-01-20 MED ORDER — HEMOSTATIC AGENTS (NO CHARGE) OPTIME
TOPICAL | Status: DC | PRN
  Administered 2017-01-20: 1 via TOPICAL

## 2017-01-20 MED ORDER — PROPOFOL 10 MG/ML IV BOLUS
INTRAVENOUS | Status: AC
  Filled 2017-01-20: qty 20

## 2017-01-20 MED ORDER — KETOROLAC TROMETHAMINE 30 MG/ML IJ SOLN
INTRAMUSCULAR | Status: AC
  Filled 2017-01-20: qty 1

## 2017-01-20 MED ORDER — PANTOPRAZOLE SODIUM 40 MG PO TBEC
40.0000 mg | DELAYED_RELEASE_TABLET | Freq: Every day | ORAL | Status: DC
  Administered 2017-01-20: 40 mg via ORAL
  Filled 2017-01-20: qty 1

## 2017-01-20 MED ORDER — ONDANSETRON HCL 4 MG/2ML IJ SOLN: 4.0000 mg | Freq: Four times a day (QID) | INTRAMUSCULAR | Status: DC | PRN

## 2017-01-20 MED ORDER — 0.9 % SODIUM CHLORIDE (POUR BTL) OPTIME
TOPICAL | Status: DC | PRN
Start: 2017-01-20 — End: 2017-01-20
  Administered 2017-01-20: 1000 mL

## 2017-01-20 MED ORDER — DEXAMETHASONE SODIUM PHOSPHATE 10 MG/ML IJ SOLN
INTRAMUSCULAR | Status: DC | PRN
  Administered 2017-01-20: 10 mg via INTRAVENOUS

## 2017-01-20 MED ORDER — LIDOCAINE-EPINEPHRINE (PF) 2 %-1:200000 IJ SOLN
INTRAMUSCULAR | Status: DC | PRN
Start: 2017-01-20 — End: 2017-01-20
  Administered 2017-01-20: 10 mL

## 2017-01-20 MED ORDER — OXYCODONE HCL 5 MG PO TABS: 5.0000 mg | ORAL_TABLET | Freq: Once | ORAL | Status: DC | PRN

## 2017-01-20 MED ORDER — CYCLOBENZAPRINE HCL 10 MG PO TABS: 10.0000 mg | ORAL_TABLET | Freq: Three times a day (TID) | ORAL | 0 refills | Status: DC | PRN

## 2017-01-20 MED ORDER — METHYLPREDNISOLONE 4 MG PO TABS: 4.0000 mg | ORAL_TABLET | Freq: Every day | ORAL | Status: DC

## 2017-01-20 MED ORDER — DEXAMETHASONE SODIUM PHOSPHATE 10 MG/ML IJ SOLN
INTRAMUSCULAR | Status: AC
  Filled 2017-01-20: qty 1

## 2017-01-20 MED ORDER — MENTHOL 3 MG MT LOZG: 1.0000 | LOZENGE | OROMUCOSAL | Status: DC | PRN

## 2017-01-20 MED ORDER — LIDOCAINE HCL (CARDIAC) 20 MG/ML IV SOLN
INTRAVENOUS | Status: DC | PRN
  Administered 2017-01-20: 100 mg via INTRAVENOUS

## 2017-01-20 MED ORDER — MIDAZOLAM HCL 2 MG/2ML IJ SOLN
INTRAMUSCULAR | Status: AC
  Filled 2017-01-20: qty 2

## 2017-01-20 MED ORDER — BUPIVACAINE HCL (PF) 0.25 % IJ SOLN
INTRAMUSCULAR | Status: DC | PRN
  Administered 2017-01-20: 10 mL

## 2017-01-20 MED ORDER — HYDROMORPHONE HCL 1 MG/ML IJ SOLN
0.2500 mg | INTRAMUSCULAR | Status: DC | PRN
  Administered 2017-01-20 (×4): 0.5 mg via INTRAVENOUS

## 2017-01-20 MED ORDER — EPHEDRINE 5 MG/ML INJ
INTRAVENOUS | Status: AC
  Filled 2017-01-20: qty 10

## 2017-01-20 MED ORDER — BUPIVACAINE HCL (PF) 0.25 % IJ SOLN
INTRAMUSCULAR | Status: AC
  Filled 2017-01-20: qty 30

## 2017-01-20 MED ORDER — KETOROLAC TROMETHAMINE 30 MG/ML IJ SOLN
INTRAMUSCULAR | Status: DC | PRN
  Administered 2017-01-20: 30 mg via INTRAVENOUS

## 2017-01-20 MED ORDER — HYDROMORPHONE HCL 1 MG/ML IJ SOLN
INTRAMUSCULAR | Status: AC
  Administered 2017-01-20: 0.5 mg via INTRAVENOUS
  Filled 2017-01-20: qty 1

## 2017-01-20 MED ORDER — METHYLPREDNISOLONE 4 MG PO TABS
20.0000 mg | ORAL_TABLET | Freq: Every day | ORAL | Status: AC
  Administered 2017-01-20: 15:00:00 20 mg via ORAL
  Filled 2017-01-20: qty 1

## 2017-01-20 MED ORDER — FENTANYL CITRATE (PF) 100 MCG/2ML IJ SOLN
INTRAMUSCULAR | Status: AC
  Filled 2017-01-20: qty 4

## 2017-01-20 MED ORDER — ACETAMINOPHEN 325 MG PO TABS: 650.0000 mg | ORAL_TABLET | ORAL | Status: DC | PRN

## 2017-01-20 MED ORDER — METHYLPREDNISOLONE 16 MG PO TABS: 16.0000 mg | ORAL_TABLET | Freq: Every day | ORAL | Status: DC

## 2017-01-20 MED ORDER — CYCLOBENZAPRINE HCL 10 MG PO TABS
ORAL_TABLET | ORAL | Status: AC
  Administered 2017-01-20: 10 mg via ORAL
  Filled 2017-01-20: qty 1

## 2017-01-20 MED ORDER — SUGAMMADEX SODIUM 200 MG/2ML IV SOLN
INTRAVENOUS | Status: AC
  Filled 2017-01-20: qty 2

## 2017-01-20 MED ORDER — PHENOL 1.4 % MT LIQD: 1.0000 | OROMUCOSAL | Status: DC | PRN

## 2017-01-20 MED ORDER — OXYCODONE-ACETAMINOPHEN 5-325 MG PO TABS
1.0000 | ORAL_TABLET | ORAL | Status: DC | PRN
  Administered 2017-01-20: 2 via ORAL

## 2017-01-20 MED ORDER — ONDANSETRON HCL 4 MG/2ML IJ SOLN
INTRAMUSCULAR | Status: AC
  Filled 2017-01-20: qty 2

## 2017-01-20 MED ORDER — ONDANSETRON HCL 4 MG/2ML IJ SOLN: 4.0000 mg | INTRAMUSCULAR | Status: DC | PRN

## 2017-01-20 MED ORDER — SODIUM CHLORIDE 0.9 % IV SOLN: 250.0000 mL | INTRAVENOUS | Status: DC

## 2017-01-20 MED ORDER — OXYCODONE HCL 5 MG/5ML PO SOLN: 5.0000 mg | Freq: Once | ORAL | Status: DC | PRN

## 2017-01-20 MED ORDER — PROPOFOL 10 MG/ML IV BOLUS
INTRAVENOUS | Status: DC | PRN
  Administered 2017-01-20: 200 mg via INTRAVENOUS

## 2017-01-20 MED ORDER — CYCLOBENZAPRINE HCL 10 MG PO TABS
10.0000 mg | ORAL_TABLET | Freq: Three times a day (TID) | ORAL | Status: DC | PRN
  Administered 2017-01-20: 10 mg via ORAL

## 2017-01-20 MED ORDER — THROMBIN 5000 UNITS EX SOLR
CUTANEOUS | Status: AC
  Filled 2017-01-20: qty 10000

## 2017-01-20 MED ORDER — MIDAZOLAM HCL 5 MG/5ML IJ SOLN
INTRAMUSCULAR | Status: DC | PRN
  Administered 2017-01-20: 2 mg via INTRAVENOUS

## 2017-01-20 MED ORDER — MUPIROCIN 2 % EX OINT
1.0000 "application " | TOPICAL_OINTMENT | Freq: Once | CUTANEOUS | Status: AC
  Administered 2017-01-20: 1 via TOPICAL
  Filled 2017-01-20: qty 22

## 2017-01-20 MED ORDER — FENTANYL CITRATE (PF) 100 MCG/2ML IJ SOLN
INTRAMUSCULAR | Status: DC | PRN
  Administered 2017-01-20: 50 ug via INTRAVENOUS
  Administered 2017-01-20: 100 ug via INTRAVENOUS
  Administered 2017-01-20: 50 ug via INTRAVENOUS

## 2017-01-20 MED ORDER — SUGAMMADEX SODIUM 200 MG/2ML IV SOLN
INTRAVENOUS | Status: DC | PRN
  Administered 2017-01-20: 200 mg via INTRAVENOUS

## 2017-01-20 MED ORDER — METHYLPREDNISOLONE 4 MG PO TABS: 8.0000 mg | ORAL_TABLET | Freq: Every day | ORAL | Status: DC

## 2017-01-20 MED ORDER — ALUM & MAG HYDROXIDE-SIMETH 200-200-20 MG/5ML PO SUSP: 30.0000 mL | Freq: Four times a day (QID) | ORAL | Status: DC | PRN

## 2017-01-20 MED ORDER — ONDANSETRON HCL 4 MG/2ML IJ SOLN
INTRAMUSCULAR | Status: DC | PRN
  Administered 2017-01-20: 4 mg via INTRAVENOUS

## 2017-01-20 MED ORDER — CEFAZOLIN SODIUM-DEXTROSE 2-4 GM/100ML-% IV SOLN
2.0000 g | Freq: Three times a day (TID) | INTRAVENOUS | Status: DC
  Administered 2017-01-20: 2 g via INTRAVENOUS
  Filled 2017-01-20: qty 100

## 2017-01-20 SURGICAL SUPPLY — 50 items
BAG DECANTER FOR FLEXI CONT (MISCELLANEOUS) ×3 IMPLANT
BENZOIN TINCTURE PRP APPL 2/3 (GAUZE/BANDAGES/DRESSINGS) ×3 IMPLANT
BLADE SURG 11 STRL SS (BLADE) ×3 IMPLANT
BUR MATCHSTICK NEURO 3.0 LAGG (BURR) ×3 IMPLANT
BUR PRECISION FLUTE 6.0 (BURR) ×3 IMPLANT
CANISTER SUCT 3000ML PPV (MISCELLANEOUS) ×3 IMPLANT
CARTRIDGE OIL MAESTRO DRILL (MISCELLANEOUS) ×1 IMPLANT
CLOSURE WOUND 1/2 X4 (GAUZE/BANDAGES/DRESSINGS) ×1
DERMABOND ADVANCED (GAUZE/BANDAGES/DRESSINGS) ×2
DERMABOND ADVANCED .7 DNX12 (GAUZE/BANDAGES/DRESSINGS) ×1 IMPLANT
DIFFUSER DRILL AIR PNEUMATIC (MISCELLANEOUS) ×3 IMPLANT
DRAPE HALF SHEET 40X57 (DRAPES) ×3 IMPLANT
DRAPE LAPAROTOMY 100X72X124 (DRAPES) ×3 IMPLANT
DRAPE MICROSCOPE LEICA (MISCELLANEOUS) ×3 IMPLANT
DRAPE POUCH INSTRU U-SHP 10X18 (DRAPES) ×3 IMPLANT
DRAPE SURG 17X23 STRL (DRAPES) ×3 IMPLANT
DRSG OPSITE POSTOP 4X6 (GAUZE/BANDAGES/DRESSINGS) ×3 IMPLANT
DURAPREP 26ML APPLICATOR (WOUND CARE) ×3 IMPLANT
ELECT BLADE 4.0 EZ CLEAN MEGAD (MISCELLANEOUS) ×3
ELECT REM PT RETURN 9FT ADLT (ELECTROSURGICAL) ×3
ELECTRODE BLDE 4.0 EZ CLN MEGD (MISCELLANEOUS) ×1 IMPLANT
ELECTRODE REM PT RTRN 9FT ADLT (ELECTROSURGICAL) ×1 IMPLANT
GAUZE SPONGE 4X4 12PLY STRL (GAUZE/BANDAGES/DRESSINGS) IMPLANT
GLOVE BIO SURGEON STRL SZ8 (GLOVE) ×6 IMPLANT
GLOVE BIOGEL PI IND STRL 7.5 (GLOVE) ×2 IMPLANT
GLOVE BIOGEL PI INDICATOR 7.5 (GLOVE) ×4
GLOVE INDICATOR 8.5 STRL (GLOVE) ×3 IMPLANT
GLOVE SURG SS PI 7.0 STRL IVOR (GLOVE) ×3 IMPLANT
GOWN STRL REUS W/ TWL LRG LVL3 (GOWN DISPOSABLE) ×1 IMPLANT
GOWN STRL REUS W/ TWL XL LVL3 (GOWN DISPOSABLE) ×2 IMPLANT
GOWN STRL REUS W/TWL 2XL LVL3 (GOWN DISPOSABLE) IMPLANT
GOWN STRL REUS W/TWL LRG LVL3 (GOWN DISPOSABLE) ×2
GOWN STRL REUS W/TWL XL LVL3 (GOWN DISPOSABLE) ×4
KIT BASIN OR (CUSTOM PROCEDURE TRAY) ×3 IMPLANT
KIT ROOM TURNOVER OR (KITS) ×3 IMPLANT
NEEDLE HYPO 22GX1.5 SAFETY (NEEDLE) ×3 IMPLANT
NEEDLE SPNL 22GX3.5 QUINCKE BK (NEEDLE) ×3 IMPLANT
NS IRRIG 1000ML POUR BTL (IV SOLUTION) ×3 IMPLANT
OIL CARTRIDGE MAESTRO DRILL (MISCELLANEOUS) ×3
PACK LAMINECTOMY NEURO (CUSTOM PROCEDURE TRAY) ×3 IMPLANT
RUBBERBAND STERILE (MISCELLANEOUS) ×6 IMPLANT
SPONGE SURGIFOAM ABS GEL SZ50 (HEMOSTASIS) ×3 IMPLANT
STRIP CLOSURE SKIN 1/2X4 (GAUZE/BANDAGES/DRESSINGS) ×2 IMPLANT
SUT VIC AB 0 CT1 18XCR BRD8 (SUTURE) ×1 IMPLANT
SUT VIC AB 0 CT1 8-18 (SUTURE) ×2
SUT VIC AB 2-0 CT1 18 (SUTURE) ×3 IMPLANT
SUT VICRYL 4-0 PS2 18IN ABS (SUTURE) ×3 IMPLANT
TOWEL OR 17X24 6PK STRL BLUE (TOWEL DISPOSABLE) ×3 IMPLANT
TOWEL OR 17X26 10 PK STRL BLUE (TOWEL DISPOSABLE) ×3 IMPLANT
WATER STERILE IRR 1000ML POUR (IV SOLUTION) ×3 IMPLANT

## 2017-01-20 NOTE — H&P (Signed)
JACKIE RUSSMAN is an 36 y.o. female.   Chief Complaint: Left hip and leg pain HPI: 36 year old female who approximate 6 days ago experienced acute left hip and leg pain radiating down the outside lateral aspect of her calf and or foot top and bottom with numbness tingling symptoms. She also lost complete use of the right left foot. She came and saw me sooner we did an emergent MRI scan showed an acute disc herniation with large free fragment displacing the left L5 nerve root and recommended an urgent emergent microdiscectomy on the left at L4-5. We extensively went over the risks and benefits of the operation with the patient as well as perioperative course expectations of outcome and alternatives of surgery and she understood and agreed to proceed forward.  Past Medical History:  Diagnosis Date  . Anxiety    anxiety disorder  . Bulging lumbar disc   . 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 Depression   . Gallstones   . H/O varicella   . Insomnia   . Pneumonia 2016    Past Surgical History:  Procedure Laterality Date  . ABDOMINAL EXPOSURE N/A 10/10/2014   Procedure: ABDOMINAL EXPOSURE;  Surgeon: Larina Earthly, MD;  Location: [facility];  Service: Vascular;  Laterality: N/A;  ABDOMINAL EXPOSURE  . ANTERIOR LUMBAR FUSION N/A 10/10/2014   Procedure: ANTERIOR LUMBAR FUSION 1 LEVEL;  Surgeon: Mariam Dollar, MD;  Location: [facility];  Service: Neurosurgery;  Laterality: N/A;  ANTERIOR LUMBAR FUSION 1 LEVEL LUMBAR 5-SACRAL 1  . BACK SURGERY     lumber disc  . CHOLECYSTECTOMY    . CHOLECYSTECTOMY  07/11/2012   Procedure: LAPAROSCOPIC CHOLECYSTECTOMY WITH INTRAOPERATIVE CHOLANGIOGRAM;  Surgeon: Almond Lint, MD;  Location: [facility];  Service: General;  Laterality: N/A;  . ERCP  07/12/2012   Procedure: ENDOSCOPIC RETROGRADE CHOLANGIOPANCREATOGRAPHY (ERCP);  Surgeon: Petra Kuba, MD;  Location: [facility];  Service: Endoscopy;  Laterality: N/A;  . ESOPHAGOGASTRODUODENOSCOPY  08/01/2012    Procedure: ESOPHAGOGASTRODUODENOSCOPY (EGD);  Surgeon: Florencia Reasons, MD;  Location: [facility];  Service: Endoscopy;  Laterality: N/A;    Family History  Problem Relation Age of Onset  . Hypertension Mother   . Heart disease Maternal Grandfather    Social History:  reports that she has been smoking Cigarettes.  She has a 15.00 pack-year smoking history. She has never used smokeless tobacco. She reports that she does not drink alcohol or use drugs.  Allergies:  Allergies  Allergen Reactions  . Questran [Cholestyramine] Hives    Medications Prior to Admission  Medication Sig Dispense Refill  . aspirin 325 MG tablet Take 325 mg by mouth 2 (two) times daily as needed for moderate pain.    . Buprenorphine HCl-Naloxone HCl (SUBOXONE) 8-2 MG FILM Place under the tongue. Take 0.5 film in the morning, 0.5 film at noon, and 0.75 film at night    . methylPREDNISolone (MEDROL DOSEPAK) 4 MG TBPK tablet Take 4-24 mg by mouth as directed. Take 24 mgs on day 1 then decrease by 1 (4mg ) tablet daily until gone      Results for orders placed or performed during the hospital encounter of 01/20/17 (from the past 48 hour(s))  CBC     Status: Abnormal   Collection Time: 01/20/17  7:33 AM  Result Value Ref Range   WBC 17.9 (H) 4.0 - 10.5 K/uL   RBC 4.63 3.87 - 5.11 MIL/uL   Hemoglobin 13.2 12.0 - 15.0 g/dL   HCT 39.6  36.0 - 46.0 %   MCV 85.5 78.0 - 100.0 fL   MCH 28.5 26.0 - 34.0 pg   MCHC 33.3 30.0 - 36.0 g/dL   RDW 64.413.9 03.411.5 - 74.215.5 %   Platelets 433 (H) 150 - 400 K/uL  hCG, serum, qualitative     Status: None   Collection Time: 01/20/17  7:33 AM  Result Value Ref Range   Preg, Serum NEGATIVE NEGATIVE    Comment:        THE SENSITIVITY OF THIS METHODOLOGY IS >10 mIU/mL.    Mr Lumbar Spine Wo Contrast  Result Date: 01/19/2017 CLINICAL DATA:  Left low back pain.  Left foot numbness for 5 days. EXAM: MRI LUMBAR SPINE WITHOUT CONTRAST TECHNIQUE: Multiplanar, multisequence MR imaging of the  lumbar spine was performed. No intravenous contrast was administered. COMPARISON:  05/30/2014 FINDINGS: Segmentation:  Standard. Alignment:  Physiologic. Vertebrae:  No fracture, evidence of discitis, or bone lesion. Conus medullaris: Extends to the L1-2 disc level and appears normal. Paraspinal and other soft tissues: Negative Disc levels: T12- L1: Unremarkable. L1-L2: Unremarkable. L2-L3: Unremarkable. L3-L4: Unremarkable. L4-L5: Disc desiccation and bulging with left paracentral extrusion that extends inferiorly to the mid L5 body level. Broad impingement on the left L5 nerve. L5-S1:ALIF.  No residual impingement. IMPRESSION: 1. L4-5 left paracentral disc extrusion which migrates inferiorly. Left L5 compression in the subarticular recess. 2. L5-S1ALIF.  No residual impingement. Electronically Signed   By: Marnee SpringJonathon  Watts M.D.   On: 01/19/2017 08:56    Review of Systems  Musculoskeletal: Positive for back pain and myalgias.  Neurological: Positive for tingling, sensory change and focal weakness.    Blood pressure (!) 108/57, pulse (!) 109, temperature 98.3 F (36.8 C), temperature source Oral, resp. rate 18, height 5\' 6"  (1.676 m), weight 89.8 kg (198 lb), SpO2 98 %. Physical Exam  Constitutional: She is oriented to person, place, and time. She appears well-developed and well-nourished.  HENT:  Head: Normocephalic.  Eyes: Pupils are equal, round, and reactive to light.  Neck: Normal range of motion.  GI: Soft.  Neurological: She is alert and oriented to person, place, and time. GCS eye subscore is 4. GCS verbal subscore is 5. GCS motor subscore is 6.  Right lower extremity is 5 out of 5 iliopsoas, quads, hip she's, gastric, into tibialis, and EHL. Left looks to me has a virtually complete left foot drop at one out of 5 decreased sensation L5 and S1 disposition  Skin: Skin is warm and dry.     Assessment/Plan 36 or female presents for an L4-5 laminectomy microdiscectomy  Bambi Fehnel P,  MD 01/20/2017, 8:25 AM

## 2017-01-20 NOTE — Transfer of Care (Signed)
Immediate Anesthesia Transfer of Care Note  Patient: Crystal Acosta  Procedure(s) Performed: Procedure(s): Left Lumbar Four-Five Microdiscectomy (Left)  Patient Location: PACU  Anesthesia Type:General  Level of Consciousness: awake and alert   Airway & Oxygen Therapy: Patient Spontanous Breathing and Patient connected to nasal cannula oxygen  Post-op Assessment: Report given to RN, Post -op Vital signs reviewed and stable and Patient moving all extremities X 4  Post vital signs: Reviewed and stable  Last Vitals:  Vitals:   01/20/17 0755 01/20/17 0803  BP: 124/74 (!) 108/57  Pulse: (!) 109   Resp: 20 18  Temp: 36.8 C     Last Pain:  Vitals:   01/20/17 0755  TempSrc: Oral  PainSc:       Patients Stated Pain Goal: 2 (01/20/17 0745)  Complications: No apparent anesthesia complications

## 2017-01-20 NOTE — Op Note (Signed)
Operative diagnosis: Nucleus opposes L4-5 left with an acute foot drop  Postoperative diagnosis: Same  Procedure: Lumbar laminectomy microscope discectomy L4-5 left with microdissection of left L5 nerve root and microscopic discectomy  Surgeon: Jillyn HiddenGary Aubreanna Percle  Asst.: Marikay Alaravid Jones  Anesthesia: Gen.  EBL: Minimal  History of present illness: Patient is a 36 year old female who developed acute onset left leg pain proximal to 6 days ago and with acute loss of strength with numbness tingling in her left foot. Patient was urgently brought to the office and underwent urgent MRI scan showing an acute disc herniation and diagnosis of an acute left footdrop. Due to progressive clinical syndrome imaging findings I recommended limiting microscopic discectomy I extensively went over the risks and benefits of the operation with the patient as well as perioperative course expectations of outcome and alternatives of surgery and she understood and agreed to proceed forward.  Operative procedure: Patient brought into the or was induced under general anesthesia positioned prone on the Wilson frame her back was prepped and draped in routine sterile fashion preoperative x-ray localize the appropriate level so after infiltration of 10 mL lidocaine with epi a midline incision was made and Bovie light cautery was used to take down the subcutaneous tissues and subperiosteal dissections care lamina of L4 and L5 on the left. Interoperative x-ray confirmed with navigation proper level so high-speed drill was used to drill down the lamina of L4 medial facet complex super aspect of lamina of L5 laminotomy was again was begun with a 3 mm Kerrison punch ligament flavum was removed in piecemeal fashion thecal sac was identified and under microscopic illumination further under biting of the lateral gutter I doubt lab indication of the L5 pedicle. Sedimentation rate underneath the L5 nerve was several large free fragments of disc  displacement the L5 nerve against the L5 pedicle. After these were teased away with a nerve hook the disc space was inspected the disc space was significantly bulging still partially contained within scar ligament this was incised widely the spaces cleanout radical pituitary rongeurs Epstein curettes. At the discectomy was no further stenosis either centrally or foraminally the wounds and to proceed irrigated meticulous hemostasis was maintained Gelfoam was overlaid top of the dura the muscle fascia approximated with interrupted Vicryls and a running 4 septic or and skin Dermabond benzo and Steri-Strips and a sterile dressing was applied patient recovered in stable condition. At the end of case all needle counts sponge counts were correct.

## 2017-01-20 NOTE — Discharge Instructions (Signed)
Back Pain, Adult Back pain is very common in adults.The cause of back pain is rarely dangerous and the pain often gets better over time.The cause of your back pain may not be known. Some common causes of back pain include:  Strain of the muscles or ligaments supporting the spine.  Wear and tear (degeneration) of the spinal disks.  Arthritis.  Direct injury to the back. For many people, back pain may  Herniated Disk A herniated disk is when a disk in your spine bulges out too far. There is a disk with a spongy center in between each pair of bones in the spine (vertebrae). These disks act as shock absorbers when you move. A herniated disk can cause pain and muscle weakness. This can happen anywhere in the back or neck. Follow these instructions at home: Medicines  Take over-the-counter and prescription medicines only as told by your doctor.  Do not drive or use heavy machinery while taking prescription pain medicine. Activity  Rest as told by your doctor.  After your rest period:  Return to your normal activities. Slowly start exercising as told by your doctor. Ask what activities are safe for you.  Use good posture.  Avoid movements that cause pain.  Do not lift anything that is heavier than 10 lb (4.5 kg) until your doctor says this is safe.  Do not sit or stand for a long time without moving.  Do not sit for a long time without getting up and moving around.  Do exercises (physical therapy) as told.  Try to strengthen your back and belly (abdomen) with exercises like crunches, swimming, or walking. General instructions  Do not use any products that contain nicotine or tobacco, such as cigarettes and e-cigarettes. If you need help quitting, ask your doctor.  Do not wear high-heeled shoes.  Do not sleep on your belly.  If you are overweight, work with your doctor to lose weight safely.  To prevent or treat constipation while you are taking prescription pain  medicine, your doctor may recommend that you:  Drink enough fluid to keep your pee (urine) clear or pale yellow.  Take over-the-counter or prescription medicines.  Eat foods that are high in fiber. These include fresh fruits and vegetables, whole grains, and beans.  Limit foods that are high in fat and processed sugars. These include fried and sweet foods.  Keep all follow-up visits as told by your doctor. This is important. How is this prevented?  Stay at a healthy weight.  Try to avoid stress.  Stay in shape. Do at least 150 minutes of moderate-intensity exercise each week, such as fast walking or water aerobics.  When lifting objects:  Keep your feet as far apart as your shoulders (shoulder-width apart) or farther apart.  Tighten your belly muscles.  Bend your knees and hips and keep your spine neutral. Lift using the strength of your legs, not your back. Do not lock your knees straight out.  Always ask for help to lift heavy or awkward objects. Contact a doctor if:  You have back pain or neck pain that does not get better after 6 weeks.  You have very bad pain.  You get any of these problems in any part of your body:  Tingling.  Weakness.  Loss of feeling (numbness). Get help right away if:  You cannot move your arms or legs.  You cannot control when you pee (urinate) or poop (have a bowel movement).  You feel dizzy.  You faint.  You have trouble breathing. This information is not intended to replace advice given to you by your health care provider. Make sure you discuss any questions you have with your health care provider. Document Released: 04/16/2014 Document Revised: 07/29/2016 Document Reviewed: 05/28/2016 Elsevier Interactive Patient Education  2017 [employer].  walk each day.  Exercise regularly as directed by your health care provider. Exercise helps your back heal faster. It also helps avoid future injury by keeping your muscles strong and  flexible.  Do not stay in bed.Resting more than 1-2 days can delay your recovery.  Pay attention to your body when you bend and lift. The most comfortable positions are those that put less stress on your recovering back. Always use proper lifting techniques, including:  Bending your knees.  Keeping the load close to your body.  Avoiding twisting.  Find a comfortable position to sleep. Use a firm mattress and lie on your side with your knees slightly bent. If you lie on your back, put a pillow under your knees.  Avoid feeling anxious or stressed.Stress increases muscle tension and can worsen back pain.It is important to recognize when you are anxious or stressed and learn ways to manage it, such as with exercise.  Take medicines only as directed by your health care provider. Over-the-counter medicines to reduce pain and inflammation are often the most helpful.Your health care provider may prescribe muscle relaxant drugs.These medicines help dull your pain so you can more quickly return to your normal activities and healthy exercise.  Apply ice to the injured area:  Put ice in a plastic bag.  Place a towel between your skin and the bag.  Leave the ice on for 20 minutes, 2-3 times a day for the first 2-3 days. After that, ice and heat may be alternated to reduce pain and spasms.  Maintain a healthy weight. Excess weight puts extra stress on your back and makes it difficult to maintain good posture. Contact a health care provider if:  You have pain that is not relieved with rest or medicine.  You have increasing pain going down into the legs or buttocks.  You have pain that does not improve in one week.  You have night pain.  You lose weight.  You have a fever or chills. Get help right away if:  You develop new bowel or bladder control problems.  You have unusual weakness or numbness in your arms or legs.  You develop nausea or vomiting.  You develop abdominal  pain.  You feel faint. This information is not intended to replace advice given to you by your health care provider. Make sure you discuss any questions you have with your health care provider. Document Released: 11/30/2005 Document Revised: 04/09/2016 Document Reviewed: 04/03/2014   Wound Care  Keep the incision clean and dry remove the outer dressing in 2 days, leave the Steri-Strips intact. Wrap with Saran wrap for showers only Do not put any creams, lotions, or ointments on incision. Leave steri-strips on back.  They will fall off by themselves.  Activity Walk each and every day, increasing distance each day. No lifting greater than 5 lbs.  No lifting no bending no twisting no driving or riding a car unless coming back and forth to see me. If provided with back brace, wear when out of bed.  It is not necessary to wear brace in bed. Diet Resume your normal diet.   Return to Work Will be discussed at you follow up appointment.  Call  Your Doctor If Any of These Occur Redness, drainage, or swelling at the wound.  Temperature greater than 101 degrees. Severe pain not relieved by pain medication. Incision starts to come apart. Follow Up Appt Call today for appointment in 1-2 weeks ([phone]) or for problems.  If you have any hardware placed in your spine, you will need an x-ray before your appointment.   Risk analyst Patient Education  [employer].

## 2017-01-20 NOTE — Progress Notes (Signed)
Patient alert and oriented, mae's well, voiding adequate amount of urine, swallowing without difficulty, c/o pain at time of discharge and patient stated she is ready to go home with ice pack at incision site.. Patient discharged home with family. Script and discharged instructions given to patient. Patient and family stated understanding of instructions given. Patient has an appointment with Dr. Wynetta Emeryram

## 2017-01-20 NOTE — Anesthesia Preprocedure Evaluation (Signed)
Anesthesia Evaluation  Patient identified by MRN, date of birth, ID band Patient awake    Reviewed: Allergy & Precautions, H&P , NPO status , Patient's Chart, lab work & pertinent test results  Airway Mallampati: II   Neck ROM: full    Dental   Pulmonary Current Smoker,    breath sounds clear to auscultation       Cardiovascular negative cardio ROS   Rhythm:regular Rate:Normal     Neuro/Psych PSYCHIATRIC DISORDERS Anxiety Depression    GI/Hepatic   Endo/Other    Renal/GU      Musculoskeletal  (+) Arthritis ,   Abdominal   Peds  Hematology   Anesthesia Other Findings   Reproductive/Obstetrics                             Anesthesia Physical Anesthesia Plan  ASA: II  Anesthesia Plan: General   Post-op Pain Management:    Induction: Intravenous  Airway Management Planned: Oral ETT  Additional Equipment:   Intra-op Plan:   Post-operative Plan: Extubation in OR  Informed Consent: I have reviewed the patients History and Physical, chart, labs and discussed the procedure including the risks, benefits and alternatives for the proposed anesthesia with the patient or authorized representative who has indicated his/her understanding and acceptance.     Plan Discussed with: CRNA, Anesthesiologist and Surgeon  Anesthesia Plan Comments:         Anesthesia Quick Evaluation

## 2017-01-20 NOTE — Anesthesia Procedure Notes (Signed)
Procedure Name: Intubation Date/Time: 01/20/2017 8:44 AM Performed by: Rejeana Brock L Pre-anesthesia Checklist: Patient identified, Emergency Drugs available, Suction available and Patient being monitored Patient Re-evaluated:Patient Re-evaluated prior to inductionOxygen Delivery Method: Circle System Utilized Preoxygenation: Pre-oxygenation with 100% oxygen Intubation Type: IV induction Ventilation: Mask ventilation without difficulty Laryngoscope Size: Mac and 3 Grade View: Grade II Tube type: Oral Tube size: 7.0 mm Number of attempts: 2 Airway Equipment and Method: Stylet and Oral airway Placement Confirmation: ETT inserted through vocal cords under direct vision,  positive ETCO2 and breath sounds checked- equal and bilateral Secured at: 21 cm Tube secured with: Tape Dental Injury: Teeth and Oropharynx as per pre-operative assessment

## 2017-01-20 NOTE — Discharge Summary (Signed)
  Physician Discharge Summary  Patient ID: Crystal Acosta MRN: [id number] DOB/AGE: 36/12/1980 35 y.o.  Admit date: 01/20/2017 Discharge date: 01/20/2017  Admission Diagnoses:HNP L45  Discharge Diagnoses: same   Active Problems:   HNP (herniated nucleus pulposus), lumbar   Discharged Condition: good  Hospital Course: Patient admitted underwent microdiskectomy and did very well.  Ambulating and voiding spontaneously and tolerating a regular diet.  Stable for discharge home.  Patient will be fitted for an AFO postop  Consults: Significant Diagnostic Studies: Treatments:L45 microdiskectomy left Discharge Exam: Blood pressure 104/85, pulse 93, temperature 98 F (36.7 C), resp. rate 18, height 5\' 6"  (1.676 m), weight 89.8 kg (198 lb), SpO2 98 %. 0-1/5 left foot drop  Disposition: home   Allergies as of 01/20/2017      Reactions   Questran [cholestyramine] Hives      Medication List    TAKE these medications   aspirin 325 MG tablet Take 325 mg by mouth 2 (two) times daily as needed for moderate pain.   cyclobenzaprine 10 MG tablet Commonly known as:  FLEXERIL Take 1 tablet (10 mg total) by mouth 3 (three) times daily as needed for muscle spasms.   methylPREDNISolone 4 MG Tbpk tablet Commonly known as:  MEDROL DOSEPAK Take 4-24 mg by mouth as directed. Take 24 mgs on day 1 then decrease by 1 (4mg ) tablet daily until gone   oxyCODONE-acetaminophen 5-325 MG tablet Commonly known as:  PERCOCET/ROXICET Take 1-2 tablets by mouth every 4 (four) hours as needed for moderate pain.   SUBOXONE 8-2 MG Film Generic drug:  Buprenorphine HCl-Naloxone HCl Place under the tongue. Take 0.5 film in the morning, 0.5 film at noon, and 0.75 film at night      Follow-up Information    Rogelio Waynick P, MD Follow up.   Specialty:  Neurosurgery Contact information: [address] [place] [address] [phone]        Mariam DollarRAM,Gabriellah Rabel P, MD .   Specialty:   Neurosurgery Contact information: [address] [place] [address] [phone]           Signed: Mariam DollarCRAM,Dennys Traughber P 01/20/2017, 5:22 PM

## 2017-01-20 NOTE — Evaluation (Signed)
Physical Therapy Evaluation Patient Details Name: Crystal SamsBrean D Jolley MRN: [id number] DOB: 05/21/1981 Today's Date: 01/20/2017   History of Present Illness  36 year old female who approximate 6 days ago experienced acute left hip and leg pain radiating down the outside lateral aspect of her calf and or foot top and bottom with numbness tingling symptoms. Then experienced complete left foot drop, had an emergent MRI followed by emergent L4-5 diskectomy. PMH: lumbar fusion x2, anxiety and depression  Clinical Impression  Patient is s/p above surgery resulting in functional limitations due to the deficits listed below (see PT Problem List). Pt continues with complete left foot drop and left hip pain and was tearful and very anxious throughout eval because of this. Pt ambulating with min-guard A with left hip hike to compensate for foot drop. Will need left AFO if df does not begin to return quickly. Recommend outpt PT when appropriate to address this.  Patient will benefit from skilled PT to increase their independence and safety with mobility to allow discharge to the venue listed below.       Follow Up Recommendations Outpatient PT    Equipment Recommendations  None recommended by PT    Recommendations for Other Services OT consult     Precautions / Restrictions Precautions Precautions: Back Precaution Booklet Issued: Yes (comment) Precaution Comments: reviewed precautions Restrictions Weight Bearing Restrictions: No      Mobility  Bed Mobility Overal bed mobility: Modified Independent             General bed mobility comments: pt able to get to EOB independently with increased time but vc's needed for log rolling  Transfers Overall transfer level: Needs assistance Equipment used: None Transfers: Sit to/from Stand Sit to Stand: Supervision         General transfer comment: increased pain in standing and pt reports she does not have normal sensation  LLE  Ambulation/Gait Ambulation/Gait assistance: Min assist;Min guard Ambulation Distance (Feet): 75 Feet Assistive device: None;1 person hand held assist Gait Pattern/deviations: Decreased stance time - left;Decreased dorsiflexion - left;Decreased weight shift to left;Antalgic Gait velocity: decreased Gait velocity interpretation: Below normal speed for age/gender General Gait Details: left hip hike to compensate for foot drop. Began with min HHA but able to progress to min-guard with no UE support. Pt reports that she has fallen several times due to left foot drop  Stairs            Wheelchair Mobility    Modified Rankin (Stroke Patients Only)       Balance Overall balance assessment: Needs assistance;History of Falls Sitting-balance support: No upper extremity supported Sitting balance-Leahy Scale: Normal     Standing balance support: No upper extremity supported Standing balance-Leahy Scale: Fair                               Pertinent Vitals/Pain Pain Assessment: 0-10 Pain Score: 8  Pain Location: left hip  Pain Descriptors / Indicators: Stabbing Pain Intervention(s): Limited activity within patient's tolerance;Monitored during session;Patient requesting pain meds-RN notified    Home Living Family/patient expects to be discharged to:: Private residence Living Arrangements: Children;Parent Available Help at Discharge: Family;Available 24 hours/day Type of Home: House Home Access: Stairs to enter Entrance Stairs-Rails: Doctor, general practiceight;Left Entrance Stairs-Number of Steps: 5 Home Layout: One level Home Equipment: None      Prior Function Level of Independence: Independent         Comments: pt has a  4yo son. Lives with her mother. 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        Extremity/Trunk Assessment   Upper Extremity Assessment Upper Extremity Assessment: Overall WFL for tasks assessed    Lower Extremity  Assessment Lower Extremity Assessment: LLE deficits/detail LLE Deficits / Details: hip and knee limited by pain, ankle df 0/5, pf >2/5, able to wiggle toes minimally LLE: Unable to fully assess due to pain LLE Sensation: decreased light touch;decreased proprioception LLE Coordination: decreased gross motor;decreased fine motor    Cervical / Trunk Assessment Cervical / Trunk Assessment: Normal  Communication   Communication: No difficulties  Cognition Arousal/Alertness: Awake/alert Behavior During Therapy: Anxious Overall Cognitive Status: Within Functional Limits for tasks assessed                 General Comments: pt tearful throughout whole eval because she expected L foot drop and left hip pain to be at least somewhat improved when surgery was over    General Comments General comments (skin integrity, edema, etc.): reviewed proper posture and positioning    Exercises     Assessment/Plan    PT Assessment Patient needs continued PT services  PT Problem List Decreased strength;Decreased range of motion;Decreased activity tolerance;Decreased balance;Decreased mobility;Decreased coordination;Decreased knowledge of use of DME;Decreased knowledge of precautions;Pain;Impaired sensation          PT Treatment Interventions Gait training;Stair training;Functional mobility training;Therapeutic activities;Therapeutic exercise;Balance training;Patient/family education    PT Goals (Current goals can be found in the Care Plan section)  Acute Rehab PT Goals Patient Stated Goal: be able to feel left leg and use it, be able to work PT Goal Formulation: With patient Time For Goal Achievement: 01/27/17 Potential to Achieve Goals: Fair    Frequency Min 3X/week   Barriers to discharge        Co-evaluation               End of Session Equipment Utilized During Treatment: Gait belt Activity Tolerance: Patient limited by pain Patient left: in bed;with call bell/phone within  reach;with family/visitor present Nurse Communication: Mobility status    Functional Assessment Tool Used: clinical judgement Functional Limitation: Mobility: Walking and moving around Mobility: Walking and Moving Around Current Status [phone]): At least 1 percent but less than 20 percent impaired, limited or restricted Mobility: Walking and Moving Around Goal Status [phone]): 0 percent impaired, limited or restricted    Time: 9147-8295 PT Time Calculation (min) (ACUTE ONLY): 22 min   Charges:   PT Evaluation $PT Eval Moderate Complexity: 1 Procedure     PT G Codes:   PT G-Codes **NOT FOR INPATIENT CLASS** Functional Assessment Tool Used: clinical judgement Functional Limitation: Mobility: Walking and moving around Mobility: Walking and Moving Around Current Status (A2130): At least 1 percent but less than 20 percent impaired, limited or restricted Mobility: Walking and Moving Around Goal Status [phone]): 0 percent impaired, limited or restricted  Lyanne Co, PT  Acute Rehab Services  [phone]   Lawana Chambers Albino Bufford 01/20/2017, 3:50 PM

## 2017-01-20 NOTE — Anesthesia Postprocedure Evaluation (Signed)
Anesthesia Post Note  Patient: Crystal Acosta  Procedure(s) Performed: Procedure(s) (LRB): Left Lumbar Four-Five Microdiscectomy (Left)  Patient location during evaluation: PACU Anesthesia Type: General Level of consciousness: awake and alert and patient cooperative Pain management: pain level controlled Vital Signs Assessment: post-procedure vital signs reviewed and stable Respiratory status: spontaneous breathing and respiratory function stable Cardiovascular status: stable Anesthetic complications: no       Last Vitals:  Vitals:   01/20/17 1234 01/20/17 1430  BP: 129/88 140/82  Pulse: 95 (!) 102  Resp: 18 18  Temp: 36.7 C 36.9 C    Last Pain:  Vitals:   01/20/17 1432  TempSrc:   PainSc: 6                  [name]

## 2017-01-21 ENCOUNTER — Encounter (HOSPITAL_COMMUNITY): Payer: Self-pay | Admitting: Neurosurgery

## 2017-02-26 ENCOUNTER — Ambulatory Visit: Payer: Medicaid Other | Attending: Neurosurgery | Admitting: Physical Therapy

## 2017-02-26 DIAGNOSIS — R262 Difficulty in walking, not elsewhere classified: Secondary | ICD-10-CM | POA: Diagnosis present

## 2017-02-26 DIAGNOSIS — M25672 Stiffness of left ankle, not elsewhere classified: Secondary | ICD-10-CM

## 2017-02-26 DIAGNOSIS — M6281 Muscle weakness (generalized): Secondary | ICD-10-CM | POA: Insufficient documentation

## 2017-02-28 ENCOUNTER — Encounter: Payer: Self-pay | Admitting: Physical Therapy

## 2017-03-01 NOTE — Therapy (Signed)
[facility] [address] [place], [place], [address] Phone: [phone]   Fax:  [phone]  Physical Therapy Evaluation  Patient Details  Name: Crystal Acosta MRN: [id number] Date of Birth: 1981-01-06 Referring Provider: Dr Donalee Citrin   Encounter Date: 02/26/2017      PT End of Session - 02/28/17 1342    Visit Number 1   Number of Visits 4   Date for PT Re-Evaluation 03/28/17   PT Start Time 1101   PT Stop Time 1149   PT Time Calculation (min) 48 min   Activity Tolerance Patient tolerated treatment well   Behavior During Therapy [facility] for tasks assessed/performed      Past Medical History:  Diagnosis Date  . Anxiety    anxiety disorder  . Bulging lumbar disc   . 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 Depression   . Gallstones   . H/O varicella   . Insomnia   . Pneumonia 2016    Past Surgical History:  Procedure Laterality Date  . ABDOMINAL EXPOSURE N/A 10/10/2014   Procedure: ABDOMINAL EXPOSURE;  Surgeon: Larina Earthly, MD;  Location: [facility];  Service: Vascular;  Laterality: N/A;  ABDOMINAL EXPOSURE  . ANTERIOR LUMBAR FUSION N/A 10/10/2014   Procedure: ANTERIOR LUMBAR FUSION 1 LEVEL;  Surgeon: Mariam Dollar, MD;  Location: [facility];  Service: Neurosurgery;  Laterality: N/A;  ANTERIOR LUMBAR FUSION 1 LEVEL LUMBAR 5-SACRAL 1  . BACK SURGERY     lumber disc  . CHOLECYSTECTOMY    . CHOLECYSTECTOMY  07/11/2012   Procedure: LAPAROSCOPIC CHOLECYSTECTOMY WITH INTRAOPERATIVE CHOLANGIOGRAM;  Surgeon: Almond Lint, MD;  Location: [facility];  Service: General;  Laterality: N/A;  . ERCP  07/12/2012   Procedure: ENDOSCOPIC RETROGRADE CHOLANGIOPANCREATOGRAPHY (ERCP);  Surgeon: Petra Kuba, MD;  Location: [facility];  Service: Endoscopy;  Laterality: N/A;  . ESOPHAGOGASTRODUODENOSCOPY  08/01/2012   Procedure: ESOPHAGOGASTRODUODENOSCOPY (EGD);  Surgeon: Florencia Reasons, MD;  Location: [facility];  Service: Endoscopy;   Laterality: N/A;  . LUMBAR LAMINECTOMY/DECOMPRESSION MICRODISCECTOMY Left 01/20/2017   Procedure: Left Lumbar Four-Five Microdiscectomy;  Surgeon: Donalee Citrin, MD;  Location: [facility];  Service: Neurosurgery;  Laterality: Left;    There were no vitals filed for this visit.       Subjective Assessment - 02/28/17 1328    Subjective Pateint had a lumbar spine micordysectomy on 01/29/2017. This is her third back surgery. Since the surgery she has very littlepain but she continues to have foot drop. she comes to PT to work on strengthening exercises and tpo wokr pon neuromusclual activation of her dorsiflexors.    Limitations Standing;Walking   How long can you walk comfortably? difficulty walking distances 2nd to foot drip    Diagnostic tests Nothing post-op   Patient Stated Goals to have increased ability to activate dorsi flexors    Currently in Pain? No/denies            [facility] PT Assessment - 03/01/17 0001      Assessment   Medical Diagnosis Left foot drop/ Llumbar dysectomy    Referring Provider Dr Donalee Citrin    Onset Date/Surgical Date 01/20/17   Hand Dominance Right   Next MD Visit 1 month    Prior Therapy None      Precautions   Precautions Back   Precaution Comments Bending ifting, twisting percuations    Required Braces or Orthoses Other Brace/Splint   Other Brace/Splint Has AFO ordered  Restrictions   Weight Bearing Restrictions No     Balance Screen   Has the patient fallen in the past 6 months No   Has the patient had a decrease in activity level because of a fear of falling?  No   Is the patient reluctant to leave their home because of a fear of falling?  No     Home Environment   Additional Comments Has 40 year old child      Prior Function   Level of Independence Independent   Vocation Unemployed   Vocation Requirements Had a job lined up but needed surgery    Leisure Going to [employer]; palying with her child      Cognition   Overall Cognitive Status  Within Functional Limits for tasks assessed   Attention Focused   Focused Attention Appears intact   Memory Appears intact   Awareness Appears intact   Problem Solving Appears intact     Observation/Other Assessments   Observations Nothing significant   Focus on Therapeutic Outcomes (FOTO)  56% limitation      Sensation   Additional Comments numbness into the lateral lower leg      Coordination   Gross Motor Movements are Fluid and Coordinated Yes   Fine Motor Movements are Fluid and Coordinated Yes     Posture/Postural Control   Posture Comments flat lumbae lordois      ROM / Strength   AROM / PROM / Strength AROM;PROM;Strength     AROM   Overall AROM Comments No active dorsi flexion L      PROM   Overall PROM Comments L ankle sits at 20 degrees of plantar flexion. Patient can be strethed back to nurtral but it is tight.      Strength   Strength Assessment Site Ankle   Right/Left Ankle Left   Left Ankle Dorsiflexion 0/5   Left Ankle Plantar Flexion 4/5   Left Ankle Inversion 3+/5   Left Ankle Eversion 4+/5     Palpation   Palpation comment No tenderness to palpation      Ambulation/Gait   Gait Comments foot drop on the left side. Patient over flexeds her hip. She reports she has shortened her stride to improve her gait technique,                    [facility] Adult PT Treatment/Exercise - 03/01/17 0001      Modalities   Modalities Electrical Stimulation     Electrical Stimulation   Electrical Stimulation Location left anteriro tib   Engineer, manufacturing Russian to improve muscle activiation    Electrical Stimulation Parameters 10/20    Electrical Stimulation Goals Neuromuscular facilitation     Manual Therapy   Manual therapy comments manual gastroc stretch.      Ankle Exercises: Supine   T-Band ev 2x10 IV 2X10 YELLOW    Other Supine Ankle Exercises STEAP STRETCH FOR GASTROC 3X30SEC HOLD      Ankle Exercises: Standing   Other Standing  Ankle Exercises standing gastroc stretch 3x30sec hold                 PT Education - 02/28/17 1336    Education provided Yes   Education Details reviewed purchase of a NMES unit; rationale behind NMES;    Person(s) Educated Patient   Methods Explanation;Demonstration   Comprehension Verbalized understanding;Returned demonstration;Verbal cues required;Tactile cues required          PT Short Term Goals -  02/28/17 1409      PT SHORT TERM GOAL #1   Title Patient will demsotrate 2/5 anterior tib muscle strength    Baseline 0/5 anterior tib strength    Time 3   Period Weeks   Status New     PT SHORT TERM GOAL #2   Title Patient will be independent with initial HEP    Baseline No HEP for strengthening    Time 3   Period Weeks   Status New     PT SHORT TERM GOAL #3   Title Patient will demsonstre independent don/ doffing of NMES unit    Baseline Does not have a unit or the knowldege of how to fit the unit.    Time 3   Period Weeks   Status New           PT Long Term Goals - 03/01/17 0745      PT LONG TERM GOAL #1   Title Patient will return to workout porgram that protects her lumbar spine and promotes active dorsi -flexion    Baseline currently does not have an exercise program    Time 6   Period Weeks   Status New     PT LONG TERM GOAL #2   Title Patient will demsotrate 3/5 active left dorsiflexion strength    Baseline 0/5 active left dorsifliexion strength    Time 6   Period Weeks   Status New     PT LONG TERM GOAL #3   Title Patient will show a 44% limitation on FOTO    Baseline 56% limitation    Time 6   Period Weeks   Status New               Plan - 02/28/17 1354    Clinical Impression Statement Patient is a 36 year old female S/P microdisectomy who presents with left foot drop. Her pain in her back is well controlled. She has tightneing of her gastroc. She has difficutly walking distances because of her foot drop. she has an AFO on  order. Therapy contacted NMES company and the unit will not be covered by medicare. she was given several options of units to buy. Therapy will could not get a moter resose today. She would benefit from skilled therapy to improve dorsiflexion.   CPT code [address]   Clinical Impairments Affecting Rehab Potential limited muscle respose to Russsian. Limited visits per mediciad    PT Frequency 1x / week   PT Duration 4 weeks   PT Treatment/Interventions ADLs/Self Care Home Management;Cryotherapy;Electrical Stimulation;Iontophoresis 4mg /ml Dexamethasone;Functional mobility training;Neuromuscular re-education;Therapeutic activities;Therapeutic exercise;Patient/family education;Manual techniques;Passive range of motion;Dry needling   PT Next Visit Plan continue to try to elicit a moter respose with tens; continue stretching, Continue peroneal exercies; give [facility] for [facility]    PT Home Exercise Plan band IR; Gatroc strrtch standing;    Consulted and Agree with Plan of Care Patient      Patient will benefit from skilled therapeutic intervention in order to improve the following deficits and impairments:  Abnormal gait, Difficulty walking, Decreased strength, Increased edema  Visit Diagnosis: Difficulty in walking, not elsewhere classified - Plan: PT plan of care cert/re-cert  Muscle weakness (generalized) - Plan: PT plan of care cert/re-cert  Stiffness of left ankle, not elsewhere classified - Plan: PT plan of care cert/re-cert     Problem List Patient Active Problem List   Diagnosis Date Noted  . HNP (herniated nucleus pulposus), lumbar 01/20/2017  .  DDD (degenerative disc disease), lumbosacral 10/10/2014  . Abdominal pain 07/29/2012  . S/P laparoscopic cholecystectomy 07/29/2012  . DISC DISEASE, LUMBAR 01/07/2011  . LUMBAR RADICULOPATHY, LEFT 01/07/2011  . LOW BACK PAIN 06/30/2007    Dessie Comaavid J Jacoby Ritsema PT DPT 03/01/2017, 11:04 AM  [facility] [address] [place], [place], [address] Phone: [phone]   Fax:  [phone]  Name: Crystal Acosta MRN: [id number] Date of Birth: 11/05/1981

## 2017-03-09 ENCOUNTER — Encounter: Payer: Self-pay | Admitting: Physical Therapy

## 2017-03-09 ENCOUNTER — Ambulatory Visit: Payer: Medicaid Other | Admitting: Physical Therapy

## 2017-03-09 DIAGNOSIS — M6281 Muscle weakness (generalized): Secondary | ICD-10-CM

## 2017-03-09 DIAGNOSIS — M25672 Stiffness of left ankle, not elsewhere classified: Secondary | ICD-10-CM

## 2017-03-09 DIAGNOSIS — R262 Difficulty in walking, not elsewhere classified: Secondary | ICD-10-CM | POA: Diagnosis not present

## 2017-03-10 ENCOUNTER — Encounter: Payer: Self-pay | Admitting: Physical Therapy

## 2017-03-10 NOTE — Therapy (Signed)
[facility] [address] [place], [place], [address] Phone: [phone]   Fax:  [phone]  Physical Therapy Treatment  Patient Details  Name: Crystal Acosta MRN: [id number] Date of Birth: 12/19/1980 Referring Provider: Dr Donalee CitrinGary Cram   Encounter Date: 03/09/2017      PT End of Session - 03/09/17 2309    Visit Number 2   Number of Visits 4   Date for PT Re-Evaluation 03/28/17   PT Start Time 1415   PT Stop Time 1510   PT Time Calculation (min) 55 min   Activity Tolerance Patient tolerated treatment well   Behavior During Therapy [facility] for tasks assessed/performed      Past Medical History:  Diagnosis Date  . Anxiety    anxiety disorder  . Bulging lumbar disc   . 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 Depression   . Gallstones   . H/O varicella   . Insomnia   . Pneumonia 2016    Past Surgical History:  Procedure Laterality Date  . ABDOMINAL EXPOSURE N/A 10/10/2014   Procedure: ABDOMINAL EXPOSURE;  Surgeon: Larina Earthlyodd F Early, MD;  Location: [facility];  Service: Vascular;  Laterality: N/A;  ABDOMINAL EXPOSURE  . ANTERIOR LUMBAR FUSION N/A 10/10/2014   Procedure: ANTERIOR LUMBAR FUSION 1 LEVEL;  Surgeon: Mariam DollarGary P Cram, MD;  Location: [facility];  Service: Neurosurgery;  Laterality: N/A;  ANTERIOR LUMBAR FUSION 1 LEVEL LUMBAR 5-SACRAL 1  . BACK SURGERY     lumber disc  . CHOLECYSTECTOMY    . CHOLECYSTECTOMY  07/11/2012   Procedure: LAPAROSCOPIC CHOLECYSTECTOMY WITH INTRAOPERATIVE CHOLANGIOGRAM;  Surgeon: Almond LintFaera Byerly, MD;  Location: [facility];  Service: General;  Laterality: N/A;  . ERCP  07/12/2012   Procedure: ENDOSCOPIC RETROGRADE CHOLANGIOPANCREATOGRAPHY (ERCP);  Surgeon: Petra KubaMarc E Magod, MD;  [facility];  Service: Endoscopy;  Laterality: N/A;  . ESOPHAGOGASTRODUODENOSCOPY  08/01/2012   Procedure: ESOPHAGOGASTRODUODENOSCOPY (EGD);  Surgeon: Florencia Reasonsobert V Buccini, MD;  [facility];  Service: Endoscopy;   Laterality: N/A;  . LUMBAR LAMINECTOMY/DECOMPRESSION MICRODISCECTOMY Left 01/20/2017   Procedure: Left Lumbar Four-Five Microdiscectomy;  Surgeon: Donalee CitrinGary Cram, MD;  [facility];  Service: Neurosurgery;  Laterality: Left;    There were no vitals filed for this visit.      Subjective Assessment - 03/09/17 1423    Subjective The patien thas not been able to get her anterior tib to fire at this point with her home machine. She continues to have foot drop.    Limitations Standing;Walking   How long can you walk comfortably? difficulty walking distances 2nd to foot drip    Diagnostic tests Nothing post-op   Patient Stated Goals to have increased ability to activate dorsi flexors    Currently in Pain? No/denies                                 PT Education - 03/09/17 2308    Education provided Yes   Education Details rationale behind exercises that cuase inadvertant firing of the anterior tib.    Person(s) Educated Patient   Methods Explanation;Demonstration   Comprehension Verbalized understanding;Returned demonstration;Verbal cues required;Tactile cues required          PT Short Term Goals - 03/10/17 0812      PT SHORT TERM GOAL #1   Title Patient will demsotrate 2/5 anterior tib muscle strength    Baseline 1/5 anterior tib strength 3/27  Time 3   Period Weeks   Status On-going     PT SHORT TERM GOAL #2   Title Patient will be independent with initial HEP    Baseline No HEP for strengthening    Time 3   Period Weeks   Status On-going     PT SHORT TERM GOAL #3   Title Patient will demsonstre independent don/ doffing of NMES unit    Baseline Does not have a unit or the knowldege of how to fit the unit.    Time 3   Period Weeks   Status Achieved           PT Long Term Goals - 03/01/17 0745      PT LONG TERM GOAL #1   Title Patient will return to workout porgram that protects her lumbar spine and promotes active dorsi -flexion    Baseline  currently does not have an exercise program    Time 6   Period Weeks   Status New     PT LONG TERM GOAL #2   Title Patient will demsotrate 3/5 active left dorsiflexion strength    Baseline 0/5 active left dorsifliexion strength    Time 6   Period Weeks   Status New     PT LONG TERM GOAL #3   Title Patient will show a 44% limitation on FOTO    Baseline 56% limitation    Time 6   Period Weeks   Status New               Plan - 03/10/17 3875    Clinical Impression Statement Patient had trace movement of her anterior tib today. She tolerated exercises well. Therapy focused on exercises for involuntary firing of the anterior tib.    Rehab Potential Good   Clinical Impairments Affecting Rehab Potential limited muscle respose to Russsian. Limited visits per mediciad    PT Frequency 1x / week   PT Duration 4 weeks   PT Treatment/Interventions ADLs/Self Care Home Management;Cryotherapy;Electrical Stimulation;Iontophoresis 4mg /ml Dexamethasone;Functional mobility training;Neuromuscular re-education;Therapeutic activities;Therapeutic exercise;Patient/family education;Manual techniques;Passive range of motion;Dry needling   PT Next Visit Plan continue to try to elicit a moter respose with tens; continue stretching, Continue peroneal exercies; give [facility]    PT Home Exercise Plan band IR; Gatroc strrtch standing;    Consulted and Agree with Plan of Care Patient      Patient will benefit from skilled therapeutic intervention in order to improve the following deficits and impairments:  Abnormal gait, Difficulty walking, Decreased strength, Increased edema  Visit Diagnosis: Difficulty in walking, not elsewhere classified  Muscle weakness (generalized)  Stiffness of left ankle, not elsewhere classified     Problem List Patient Active Problem List   Diagnosis Date Noted  . HNP (herniated nucleus pulposus), lumbar 01/20/2017  . DDD (degenerative disc disease),  lumbosacral 10/10/2014  . Abdominal pain 07/29/2012  . S/P laparoscopic cholecystectomy 07/29/2012  . DISC DISEASE, LUMBAR 01/07/2011  . LUMBAR RADICULOPATHY, LEFT 01/07/2011  . LOW BACK PAIN 06/30/2007    Dessie Coma PT DPT  03/10/2017, 8:17 AM  [facility] [address] [place], [place], [address] Phone: [phone]   Fax:  [phone]  Name: Crystal Acosta MRN: [id number] Date of Birth: 10/06/1981

## 2017-03-17 ENCOUNTER — Ambulatory Visit: Payer: Medicaid Other | Attending: Neurosurgery | Admitting: Physical Therapy

## 2017-03-17 ENCOUNTER — Encounter: Payer: Self-pay | Admitting: Physical Therapy

## 2017-03-17 DIAGNOSIS — R262 Difficulty in walking, not elsewhere classified: Secondary | ICD-10-CM | POA: Diagnosis not present

## 2017-03-17 DIAGNOSIS — M25672 Stiffness of left ankle, not elsewhere classified: Secondary | ICD-10-CM | POA: Diagnosis present

## 2017-03-17 DIAGNOSIS — M6281 Muscle weakness (generalized): Secondary | ICD-10-CM | POA: Diagnosis present

## 2017-03-17 NOTE — Therapy (Signed)
[facility] [address] [place], [place], [address] Phone: [phone]   Fax:  [phone]  Physical Therapy Treatment  Patient Details  Name: Crystal Acosta MRN: [id number] Date of Birth: 02/09/1981 Referring Provider: Dr Donalee Citrin   Encounter Date: 03/17/2017      PT End of Session - 03/17/17 1433    Visit Number 3   Number of Visits 4   Date for PT Re-Evaluation 03/28/17   PT Start Time 1415   PT Stop Time 1508   PT Time Calculation (min) 53 min   Activity Tolerance Patient tolerated treatment well   Behavior During Therapy [facility] for tasks assessed/performed      Past Medical History:  Diagnosis Date  . Anxiety    anxiety disorder  . Bulging lumbar disc   . 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 Depression   . Gallstones   . H/O varicella   . Insomnia   . Pneumonia 2016    Past Surgical History:  Procedure Laterality Date  . ABDOMINAL EXPOSURE N/A 10/10/2014   Procedure: ABDOMINAL EXPOSURE;  Surgeon: Larina Earthly, MD;  Location: [facility];  Service: Vascular;  Laterality: N/A;  ABDOMINAL EXPOSURE  . ANTERIOR LUMBAR FUSION N/A 10/10/2014   Procedure: ANTERIOR LUMBAR FUSION 1 LEVEL;  Surgeon: Mariam Dollar, MD;  Location: [facility];  Service: Neurosurgery;  Laterality: N/A;  ANTERIOR LUMBAR FUSION 1 LEVEL LUMBAR 5-SACRAL 1  . BACK SURGERY     lumber disc  . CHOLECYSTECTOMY    . CHOLECYSTECTOMY  07/11/2012   Procedure: LAPAROSCOPIC CHOLECYSTECTOMY WITH INTRAOPERATIVE CHOLANGIOGRAM;  Surgeon: Almond Lint, MD;  Location: [facility];  Service: General;  Laterality: N/A;  . ERCP  07/12/2012   Procedure: ENDOSCOPIC RETROGRADE CHOLANGIOPANCREATOGRAPHY (ERCP);  Surgeon: Petra Kuba, MD;  Location: [facility];  Service: Endoscopy;  Laterality: N/A;  . ESOPHAGOGASTRODUODENOSCOPY  08/01/2012   Procedure: ESOPHAGOGASTRODUODENOSCOPY (EGD);  Surgeon: Florencia Reasons, MD;  Location: [facility];  Service: Endoscopy;   Laterality: N/A;  . LUMBAR LAMINECTOMY/DECOMPRESSION MICRODISCECTOMY Left 01/20/2017   Procedure: Left Lumbar Four-Five Microdiscectomy;  Surgeon: Donalee Citrin, MD;  Location: [facility];  Service: Neurosurgery;  Laterality: Left;    There were no vitals filed for this visit.      Subjective Assessment - 03/17/17 1429    Subjective The patient reports she feels like she is getting a smal amount of moevement out of her anterior tib. She revieed her AFO                         [facility] Adult PT Treatment/Exercise - 03/17/17 0001      Ambulation/Gait   Gait Comments continued foot drop      Modalities   Modalities Electrical Stimulation     Electrical Stimulation   Electrical Stimulation Location left anteriro tib   Statistician Goals Neuromuscular facilitation     Manual Therapy   Manual therapy comments manual gastroc stretch.      Ankle Exercises: Standing   Other Standing Ankle Exercises eccentric lowering from 2 inch step; inverted squat    Other Standing Ankle Exercises church pews 2x10; eccentric lowering 2x10; step onto air-ex 2x10;                   PT Short Term Goals - 03/10/17 6962      PT SHORT TERM GOAL #1   Title Patient will demsotrate 2/5 anterior tib muscle strength  Baseline 1/5 anterior tib strength 3/27    Time 3   Period Weeks   Status On-going     PT SHORT TERM GOAL #2   Title Patient will be independent with initial HEP    Baseline No HEP for strengthening    Time 3   Period Weeks   Status On-going     PT SHORT TERM GOAL #3   Title Patient will demsonstre independent don/ doffing of NMES unit    Baseline Does not have a unit or the knowldege of how to fit the unit.    Time 3   Period Weeks   Status Achieved           PT Long Term Goals - 03/01/17 0745      PT LONG TERM GOAL #1   Title Patient will return to workout porgram that protects her lumbar spine and promotes active dorsi -flexion    Baseline  currently does not have an exercise program    Time 6   Period Weeks   Status New     PT LONG TERM GOAL #2   Title Patient will demsotrate 3/5 active left dorsiflexion strength    Baseline 0/5 active left dorsifliexion strength    Time 6   Period Weeks   Status New     PT LONG TERM GOAL #3   Title Patient will show a 44% limitation on FOTO    Baseline 56% limitation    Time 6   Period Weeks   Status New               Plan - 03/17/17 1504    Clinical Impression Statement Patient continues to have some movement but not enough to prevent foot drop. She was encouraged to continue working on strengthening at home and to be patient.    Rehab Potential Good   Clinical Impairments Affecting Rehab Potential limited muscle respose to [place]. Limited visits per mediciad    PT Frequency 1x / week   PT Duration 4 weeks   PT Treatment/Interventions ADLs/Self Care Home Management;Cryotherapy;Electrical Stimulation;Iontophoresis /ml Dexamethasone;Functional mobility training;Neuromuscular re-education;Therapeutic activities;Therapeutic exercise;Patient/family education;Manual techniques;Passive range of motion;Dry needling   PT Next Visit Plan continue to try to elicit a moter respose with tens; continue stretching, Continue peroneal exercies; [facility]    PT Home Exercise Plan band IR; Gatroc strrtch standing;    Consulted and Agree with Plan of Care Patient      Patient will benefit from skilled therapeutic intervention in order to improve the following deficits and impairments:  Abnormal gait, Difficulty walking, Decreased strength, Increased edema  Visit Diagnosis: Difficulty in walking, not elsewhere classified  Muscle weakness (generalized)  Stiffness of left ankle, not elsewhere classified     Problem List Patient Active Problem List   Diagnosis Date Noted  . HNP (herniated nucleus pulposus), lumbar 01/20/2017  . DDD (degenerative disc  disease), lumbosacral 10/10/2014  . Abdominal pain 07/29/2012  . S/P laparoscopic cholecystectomy 07/29/2012  . DISC DISEASE, LUMBAR 01/07/2011  . LUMBAR RADICULOPATHY, LEFT 01/07/2011  . LOW BACK PAIN 06/30/2007    Dessie Coma PT DPT  03/17/2017, 3:18 PM  [facility] [address] [place], [place], [address] Phone: [phone]   Fax:  [phone]  Name: LASHANN HAGG MRN: [id number] Date of Birth: [birth year]

## 2017-03-24 ENCOUNTER — Ambulatory Visit: Payer: Medicaid Other | Admitting: Physical Therapy

## 2017-03-24 DIAGNOSIS — M25672 Stiffness of left ankle, not elsewhere classified: Secondary | ICD-10-CM

## 2017-03-24 DIAGNOSIS — R262 Difficulty in walking, not elsewhere classified: Secondary | ICD-10-CM

## 2017-03-24 DIAGNOSIS — M6281 Muscle weakness (generalized): Secondary | ICD-10-CM

## 2017-03-25 ENCOUNTER — Encounter: Payer: Self-pay | Admitting: Physical Therapy

## 2017-03-25 NOTE — Therapy (Signed)
[place], [place], [address] Phone: [phone]   Fax:  [phone]  Physical Therapy Treatment/ Discharge   Patient Details  Name: Crystal Acosta MRN: [id number] Date of Birth: 1981/01/31 Referring Provider: Dr Kary Kos   Encounter Date: 03/24/2017      PT End of Session - 03/25/17 1512    Visit Number 4   Number of Visits 4   Date for PT Re-Evaluation 03/28/17   PT Start Time 8938   PT Stop Time 1453   PT Time Calculation (min) 38 min   Activity Tolerance Patient tolerated treatment well   Behavior During Therapy [facility] for tasks assessed/performed      Past Medical History:  Diagnosis Date  . Anxiety    anxiety disorder  . Bulging lumbar disc   . 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 Depression   . Gallstones   . H/O varicella   . Insomnia   . Pneumonia 2016    Past Surgical History:  Procedure Laterality Date  . ABDOMINAL EXPOSURE N/A 10/10/2014   Procedure: ABDOMINAL EXPOSURE;  Surgeon: Rosetta Posner, MD;  Location: [facility];  Service: Vascular;  Laterality: N/A;  ABDOMINAL EXPOSURE  . ANTERIOR LUMBAR FUSION N/A 10/10/2014   Procedure: ANTERIOR LUMBAR FUSION 1 LEVEL;  Surgeon: Elaina Hoops, MD;  Location: [facility];  Service: Neurosurgery;  Laterality: N/A;  ANTERIOR LUMBAR FUSION 1 LEVEL LUMBAR 5-SACRAL 1  . BACK SURGERY     lumber disc  . CHOLECYSTECTOMY    . CHOLECYSTECTOMY  07/11/2012   Procedure: LAPAROSCOPIC CHOLECYSTECTOMY WITH INTRAOPERATIVE CHOLANGIOGRAM;  Surgeon: Stark Klein, MD;  Location: [facility];  Service: General;  Laterality: N/A;  . ERCP  07/12/2012   Procedure: ENDOSCOPIC RETROGRADE CHOLANGIOPANCREATOGRAPHY (ERCP);  Surgeon: Jeryl Columbia, MD;  Location: [facility];  Service: Endoscopy;  Laterality: N/A;  . ESOPHAGOGASTRODUODENOSCOPY  08/01/2012   Procedure: ESOPHAGOGASTRODUODENOSCOPY (EGD);  Surgeon: Cleotis Nipper, MD;  Location: [facility];  Service:  Endoscopy;  Laterality: N/A;  . LUMBAR LAMINECTOMY/DECOMPRESSION MICRODISCECTOMY Left 01/20/2017   Procedure: Left Lumbar Four-Five Microdiscectomy;  Surgeon: Kary Kos, MD;  Location: [facility];  Service: Neurosurgery;  Laterality: Left;    There were no vitals filed for this visit.      Subjective Assessment - 03/25/17 1505    Subjective Patient reports she is having a little bit of activity but patient is not able to actively dorsi flex her ankle at this point. she feels like she is having increased numbness when she wears her AFO. She was advised to contact the person who made it for her.    Limitations Standing;Walking   How long can you walk comfortably? difficulty walking distances 2nd to foot drip    Diagnostic tests Nothing post-op   Patient Stated Goals to have increased ability to activate dorsi flexors    Currently in Pain? No/denies                         [facility] Adult PT Treatment/Exercise - 03/25/17 0001      Ambulation/Gait   Gait Comments continued foot drop      Modalities   Modalities --     Acupuncturist Stimulation Location --   Electrical Stimulation Goals --     Ankle Exercises: Standing   Other Standing Ankle Exercises eccentric lowering from 2 inch step; inverted squat    Other Standing Ankle Exercises  church pews 2x10; eccentric lowering 2x10; step onto air-ex 2x10;      Ankle Exercises: Supine   T-Band ev 2x10 IV 2X10 YELLOW    Other Supine Ankle Exercises STEAP STRETCH FOR GASTROC 3X30SEC HOLD                 PT Education - 03/25/17 1510    Education provided Yes   Education Details importance of continuing with exercises    Person(s) Educated Patient   Methods Explanation;Demonstration   Comprehension Verbalized understanding;Returned demonstration;Verbal cues required;Tactile cues required          PT Short Term Goals - 03/10/17 0812      PT SHORT TERM GOAL #1   Title Patient will demsotrate  2/5 anterior tib muscle strength    Baseline 1/5 anterior tib strength 3/27    Time 3   Period Weeks   Status On-going     PT SHORT TERM GOAL #2   Title Patient will be independent with initial HEP    Baseline No HEP for strengthening    Time 3   Period Weeks   Status On-going     PT SHORT TERM GOAL #3   Title Patient will demsonstre independent don/ doffing of NMES unit    Baseline Does not have a unit or the knowldege of how to fit the unit.    Time 3   Period Weeks   Status Achieved           PT Long Term Goals - 03/01/17 0745      PT LONG TERM GOAL #1   Title Patient will return to workout porgram that protects her lumbar spine and promotes active dorsi -flexion    Baseline currently does not have an exercise program    Time 6   Period Weeks   Status New     PT LONG TERM GOAL #2   Title Patient will demsotrate 3/5 active left dorsiflexion strength    Baseline 0/5 active left dorsifliexion strength    Time 6   Period Weeks   Status New     PT LONG TERM GOAL #3   Title Patient will show a 44% limitation on FOTO    Baseline 56% limitation    Time 6   Period Weeks   Status New               Plan - 03/25/17 1512    Clinical Impression Statement Patient has some active movement in her anterior tib and it appears to be more then last visit but difficult to quantify. Her medicaid visits are up. She was strongly encouraged to continue. She was given exercises for home that help with involuntary firing of the muslce.    Rehab Potential Good   Clinical Impairments Affecting Rehab Potential limited muscle respose to [place]. Limited visits per mediciad    PT Frequency 2x / week   PT Duration 4 weeks   PT Treatment/Interventions ADLs/Self Care Home Management;Cryotherapy;Electrical Stimulation;Iontophoresis '4mg'$ /ml Dexamethasone;Functional mobility training;Neuromuscular re-education;Therapeutic activities;Therapeutic exercise;Patient/family education;Manual  techniques;Passive range of motion;Dry needling   PT Next Visit Plan D/C to HEP   PT Home Exercise Plan band IR; Gatroc strrtch standing;    Consulted and Agree with Plan of Care Patient      Patient will benefit from skilled therapeutic intervention in order to improve the following deficits and impairments:  Abnormal gait, Difficulty walking, Decreased strength, Increased edema  Visit Diagnosis: Difficulty in walking, not elsewhere classified  Muscle  weakness (generalized)  Stiffness of left ankle, not elsewhere classified    PHYSICAL THERAPY DISCHARGE SUMMARY  Visits from Start of Care: 4  Current functional level related to goals / functional outcomes: Continued foot drop. Out of visits per medicaid restrictions   Remaining deficits: See above    Education / Equipment: HEP, purchased NMES device  Plan: Patient agrees to discharge.  Patient goals were not met. Patient is being discharged due to financial reasons.  ?????      Problem List Patient Active Problem List   Diagnosis Date Noted  . HNP (herniated nucleus pulposus), lumbar 01/20/2017  . DDD (degenerative disc disease), lumbosacral 10/10/2014  . Abdominal pain 07/29/2012  . S/P laparoscopic cholecystectomy 07/29/2012  . [place] DISEASE, LUMBAR 01/07/2011  . LUMBAR RADICULOPATHY, LEFT 01/07/2011  . LOW BACK PAIN 06/30/2007    Carney Living PT DPT  03/25/2017, 3:22 PM  [facility] [address] [place], [place], [address] Phone: [phone]   Fax:  [phone]  Name: Crystal Acosta MRN: [id number] Date of Birth: 06/06/1981

## 2017-09-13 ENCOUNTER — Other Ambulatory Visit: Payer: Self-pay | Admitting: Neurosurgery

## 2017-09-24 NOTE — Pre-Procedure Instructions (Signed)
Crystal Acosta  09/24/2017      [id number] [place], [place] - [address] [place] [place] [address] Phone: [phone] Fax: [phone]  [id number] - [place], [place] - [address] [place] [address] Phone: [phone] Fax: [phone]    Your procedure is scheduled on October 18  Report to [facility] Admitting at 1030 A.M.  Call this number if you have problems the morning of surgery:  [phone]   Remember:  Do not eat food or drink liquids after midnight.   Continue all other medications as directed by your physician except follow these medication instructions before surgery   Take these medicines the morning of surgery with A SIP OF WATER  acetaminophen (TYLENOL) if needed  7 days prior to surgery STOP taking any Aspirin (unless otherwise instructed by your surgeon), Aleve, Naproxen, Ibuprofen, Motrin, Advil, Goody's, BC's, all herbal medications, fish oil, and all vitamins    Do not wear jewelry, make-up or nail polish.  Do not wear lotions, powders, or perfumes, or deoderant.  Do not shave 48 hours prior to surgery.  Men may shave face and neck.  Do not bring valuables to the hospital.  [facility] is not responsible for any belongings or valuables.  Contacts, dentures or bridgework may not be worn into surgery.  Leave your suitcase in the car.  After surgery it may be brought to your room.  For patients admitted to the hospital, discharge time will be determined by your treatment team.  Patients discharged the day of surgery will not be allowed to drive home.    Special instructions:   [place]- Preparing For Surgery  Before surgery, you can play an important role. Because skin is not sterile, your skin needs to be as free of germs as possible. You can reduce the number of germs on your skin by washing with CHG (chlorahexidine gluconate) Soap before surgery.  CHG is an  antiseptic cleaner which kills germs and bonds with the skin to continue killing germs even after washing.  Please do not use if you have an allergy to CHG or antibacterial soaps. If your skin becomes reddened/irritated stop using the CHG.  Do not shave (including legs and underarms) for at least 48 hours prior to first CHG shower. It is OK to shave your face.  Please follow these instructions carefully.   1. Shower the NIGHT BEFORE SURGERY and the MORNING OF SURGERY with CHG.   2. If you chose to wash your hair, wash your hair first as usual with your normal shampoo.  3. After you shampoo, rinse your hair and body thoroughly to remove the shampoo.  4. Use CHG as you would any other liquid soap. You can apply CHG directly to the skin and wash gently with a scrungie or a clean washcloth.   5. Apply the CHG Soap to your body ONLY FROM THE NECK DOWN.  Do not use on open wounds or open sores. Avoid contact with your eyes, ears, mouth and genitals (private parts). Wash Face and genitals (private parts)  with your normal soap.  6. Wash thoroughly, paying special attention to the area where your surgery will be performed.  7. Thoroughly rinse your body with warm water from the neck down.  8. DO NOT shower/wash with your normal soap after using and rinsing off the CHG Soap.  9. Pat yourself dry with a CLEAN TOWEL.  10. Wear  CLEAN PAJAMAS to bed the night before surgery, wear comfortable clothes the morning of surgery  11. Place CLEAN SHEETS on your bed the night of your first shower and DO NOT SLEEP WITH PETS.    Day of Surgery: Do not apply any deodorants/lotions. Please wear clean clothes to the hospital/surgery center.      Please read over the following fact sheets that you were given.

## 2017-09-27 ENCOUNTER — Encounter (HOSPITAL_COMMUNITY): Payer: Self-pay

## 2017-09-27 ENCOUNTER — Encounter (HOSPITAL_COMMUNITY)
Admission: RE | Admit: 2017-09-27 | Discharge: 2017-09-27 | Disposition: A | Discharge status: Home / Self Care | Payer: Medicaid Other | Source: Ambulatory Visit | Attending: Neurosurgery | Admitting: Neurosurgery

## 2017-09-27 DIAGNOSIS — M5126 Other intervertebral disc displacement, lumbar region: Secondary | ICD-10-CM | POA: Diagnosis not present

## 2017-09-27 DIAGNOSIS — Z01812 Encounter for preprocedural laboratory examination: Secondary | ICD-10-CM | POA: Diagnosis not present

## 2017-09-27 LAB — TYPE AND SCREEN
ABO/RH(D): O POS
ANTIBODY SCREEN: NEGATIVE

## 2017-09-27 LAB — CBC
HEMATOCRIT: 42.3 % (ref 36.0–46.0)
Hemoglobin: 14.1 g/dL (ref 12.0–15.0)
MCH: 29 pg (ref 26.0–34.0)
MCHC: 33.3 g/dL (ref 30.0–36.0)
MCV: 86.9 fL (ref 78.0–100.0)
Platelets: 358 10*3/uL (ref 150–400)
RBC: 4.87 MIL/uL (ref 3.87–5.11)
RDW: 14.4 % (ref 11.5–15.5)
WBC: 6.2 10*3/uL (ref 4.0–10.5)

## 2017-09-27 LAB — BASIC METABOLIC PANEL
Anion gap: 10 (ref 5–15)
BUN: 8 mg/dL (ref 6–20)
CALCIUM: 9.3 mg/dL (ref 8.9–10.3)
CO2: 23 mmol/L (ref 22–32)
Chloride: 103 mmol/L (ref 101–111)
Creatinine, Ser: 0.81 mg/dL (ref 0.44–1.00)
GFR calc non Af Amer: >60 mL/min (ref >60)
Glucose, Bld: 100 mg/dL — ABNORMAL HIGH (ref 65–99)
Potassium: 3.8 mmol/L (ref 3.5–5.1)
SODIUM: 136 mmol/L (ref 135–145)

## 2017-09-27 LAB — SURGICAL PCR SCREEN
MRSA, PCR: NEGATIVE
Staphylococcus aureus: NEGATIVE

## 2017-09-27 LAB — HCG, SERUM, QUALITATIVE: Preg, Serum: NEGATIVE

## 2017-09-27 NOTE — Progress Notes (Signed)
PCP: has appt to see doctor at [facility] after surgery to establish primary care Cardiologist: denies  DENIES: EKG, CXR, ECHO, Stress Test, Cardiac Cath  Pt sees Dr. Edwin Cap at [facility] for Suboxone.  Pt states she normally stops suboxone 24 hrs prior to surgery.  Pt reports she no longer purchases pain meds "off the street for over 3 years." Denies any other recreational drug use.   Instructed no smoking 24 hours prior to surgery.   Patient denies shortness of breath, fever, cough, and chest pain at PAT appointment.  Patient verbalized understanding of instructions provided today at the PAT appointment.  Patient asked to review instructions at home and day of surgery.

## 2017-09-30 ENCOUNTER — Inpatient Hospital Stay (HOSPITAL_COMMUNITY): Payer: Medicaid Other

## 2017-09-30 ENCOUNTER — Encounter (HOSPITAL_COMMUNITY): Payer: Self-pay | Admitting: Anesthesiology

## 2017-09-30 ENCOUNTER — Inpatient Hospital Stay (HOSPITAL_COMMUNITY)
Admission: RE | Admit: 2017-09-30 | Discharge: 2017-10-04 | DRG: 455 | Disposition: A | Discharge status: Home / Self Care | Payer: Medicaid Other | Source: Ambulatory Visit | Attending: Neurosurgery | Admitting: Neurosurgery

## 2017-09-30 ENCOUNTER — Encounter (HOSPITAL_COMMUNITY)
Admission: RE | Disposition: A | Discharge status: Home / Self Care | Payer: Self-pay | Source: Ambulatory Visit | Attending: Neurosurgery

## 2017-09-30 ENCOUNTER — Inpatient Hospital Stay (HOSPITAL_COMMUNITY): Payer: Medicaid Other | Admitting: Vascular Surgery

## 2017-09-30 ENCOUNTER — Inpatient Hospital Stay (HOSPITAL_COMMUNITY): Payer: Medicaid Other | Admitting: Anesthesiology

## 2017-09-30 DIAGNOSIS — Z6833 Body mass index (BMI) 33.0-33.9, adult: Secondary | ICD-10-CM | POA: Diagnosis not present

## 2017-09-30 DIAGNOSIS — F329 Major depressive disorder, single episode, unspecified: Secondary | ICD-10-CM | POA: Diagnosis present

## 2017-09-30 DIAGNOSIS — M5116 Intervertebral disc disorders with radiculopathy, lumbar region: Principal | ICD-10-CM | POA: Diagnosis present

## 2017-09-30 DIAGNOSIS — M21372 Foot drop, left foot: Secondary | ICD-10-CM | POA: Diagnosis present

## 2017-09-30 DIAGNOSIS — E669 Obesity, unspecified: Secondary | ICD-10-CM | POA: Diagnosis present

## 2017-09-30 DIAGNOSIS — Z23 Encounter for immunization: Secondary | ICD-10-CM | POA: Diagnosis not present

## 2017-09-30 DIAGNOSIS — F419 Anxiety disorder, unspecified: Secondary | ICD-10-CM | POA: Diagnosis present

## 2017-09-30 DIAGNOSIS — Z79899 Other long term (current) drug therapy: Secondary | ICD-10-CM

## 2017-09-30 DIAGNOSIS — Z981 Arthrodesis status: Secondary | ICD-10-CM

## 2017-09-30 DIAGNOSIS — G4700 Insomnia, unspecified: Secondary | ICD-10-CM | POA: Diagnosis present

## 2017-09-30 DIAGNOSIS — M5126 Other intervertebral disc displacement, lumbar region: Secondary | ICD-10-CM | POA: Diagnosis present

## 2017-09-30 DIAGNOSIS — F17210 Nicotine dependence, cigarettes, uncomplicated: Secondary | ICD-10-CM | POA: Diagnosis present

## 2017-09-30 DIAGNOSIS — Z419 Encounter for procedure for purposes other than remedying health state, unspecified: Secondary | ICD-10-CM

## 2017-09-30 DIAGNOSIS — M545 Low back pain: Secondary | ICD-10-CM | POA: Diagnosis present

## 2017-09-30 SURGERY — POSTERIOR LUMBAR FUSION 1 LEVEL
Anesthesia: General | Site: Back

## 2017-09-30 MED ORDER — BUPIVACAINE LIPOSOME 1.3 % IJ SUSP
INTRAMUSCULAR | Status: DC | PRN
  Administered 2017-09-30: 17 mL

## 2017-09-30 MED ORDER — CYCLOBENZAPRINE HCL 10 MG PO TABS
10.0000 mg | ORAL_TABLET | Freq: Three times a day (TID) | ORAL | Status: DC | PRN
  Administered 2017-09-30: 10 mg via ORAL

## 2017-09-30 MED ORDER — HYDROMORPHONE HCL 1 MG/ML IJ SOLN
INTRAMUSCULAR | Status: AC
  Filled 2017-09-30: qty 1

## 2017-09-30 MED ORDER — PROPOFOL 10 MG/ML IV BOLUS
INTRAVENOUS | Status: DC | PRN
  Administered 2017-09-30 (×2): 10 mg via INTRAVENOUS
  Administered 2017-09-30: 180 mg via INTRAVENOUS

## 2017-09-30 MED ORDER — ONDANSETRON HCL 4 MG/2ML IJ SOLN: 4.0000 mg | Freq: Four times a day (QID) | INTRAMUSCULAR | Status: DC | PRN

## 2017-09-30 MED ORDER — LIDOCAINE-EPINEPHRINE 1 %-1:100000 IJ SOLN
INTRAMUSCULAR | Status: AC
  Filled 2017-09-30: qty 1

## 2017-09-30 MED ORDER — ROCURONIUM BROMIDE 50 MG/5ML IV SOLN
INTRAVENOUS | Status: AC
  Filled 2017-09-30: qty 4

## 2017-09-30 MED ORDER — HYDROMORPHONE HCL 1 MG/ML IJ SOLN
0.5000 mg | Freq: Once | INTRAMUSCULAR | Status: AC
  Administered 2017-09-30: 0.5 mg via INTRAVENOUS

## 2017-09-30 MED ORDER — SUGAMMADEX SODIUM 200 MG/2ML IV SOLN
INTRAVENOUS | Status: DC | PRN
  Administered 2017-09-30: 200 mg via INTRAVENOUS

## 2017-09-30 MED ORDER — THROMBIN 20000 UNITS EX KIT
PACK | CUTANEOUS | Status: AC
  Filled 2017-09-30: qty 1

## 2017-09-30 MED ORDER — PHENOL 1.4 % MT LIQD: 1.0000 | OROMUCOSAL | Status: DC | PRN

## 2017-09-30 MED ORDER — CYCLOBENZAPRINE HCL 10 MG PO TABS
ORAL_TABLET | ORAL | Status: AC
  Administered 2017-09-30: 10 mg via ORAL
  Filled 2017-09-30: qty 1

## 2017-09-30 MED ORDER — MIDAZOLAM HCL 2 MG/2ML IJ SOLN
INTRAMUSCULAR | Status: AC
Start: 2017-09-30 — End: 2017-10-01
  Filled 2017-09-30: qty 2

## 2017-09-30 MED ORDER — FENTANYL CITRATE (PF) 250 MCG/5ML IJ SOLN
INTRAMUSCULAR | Status: AC
  Filled 2017-09-30: qty 5

## 2017-09-30 MED ORDER — ONDANSETRON HCL 4 MG/2ML IJ SOLN
INTRAMUSCULAR | Status: DC | PRN
  Administered 2017-09-30: 4 mg via INTRAVENOUS

## 2017-09-30 MED ORDER — SODIUM CHLORIDE 0.9% FLUSH
3.0000 mL | Freq: Two times a day (BID) | INTRAVENOUS | Status: DC
  Administered 2017-10-01 – 2017-10-04 (×5): 3 mL via INTRAVENOUS

## 2017-09-30 MED ORDER — FENTANYL CITRATE (PF) 250 MCG/5ML IJ SOLN
INTRAMUSCULAR | Status: DC | PRN
  Administered 2017-09-30 (×2): 50 ug via INTRAVENOUS
  Administered 2017-09-30: 25 ug via INTRAVENOUS
  Administered 2017-09-30: 50 ug via INTRAVENOUS
  Administered 2017-09-30: 25 ug via INTRAVENOUS
  Administered 2017-09-30 (×2): 50 ug via INTRAVENOUS
  Administered 2017-09-30 (×7): 25 ug via INTRAVENOUS
  Administered 2017-09-30: 50 ug via INTRAVENOUS
  Administered 2017-09-30: 100 ug via INTRAVENOUS
  Administered 2017-09-30: 25 ug via INTRAVENOUS
  Administered 2017-09-30: 100 ug via INTRAVENOUS

## 2017-09-30 MED ORDER — SODIUM CHLORIDE 0.9% FLUSH: 9.0000 mL | INTRAVENOUS | Status: DC | PRN

## 2017-09-30 MED ORDER — LACTATED RINGERS IV SOLN
INTRAVENOUS | Status: DC | PRN
  Administered 2017-09-30 (×3): via INTRAVENOUS

## 2017-09-30 MED ORDER — MIDAZOLAM HCL 5 MG/5ML IJ SOLN
INTRAMUSCULAR | Status: DC | PRN
  Administered 2017-09-30: 2 mg via INTRAVENOUS

## 2017-09-30 MED ORDER — CHLORHEXIDINE GLUCONATE CLOTH 2 % EX PADS: 6.0000 | MEDICATED_PAD | Freq: Once | CUTANEOUS | Status: DC

## 2017-09-30 MED ORDER — METOCLOPRAMIDE HCL 5 MG/ML IJ SOLN: 10.0000 mg | Freq: Once | INTRAMUSCULAR | Status: DC | PRN

## 2017-09-30 MED ORDER — SODIUM CHLORIDE 0.9 % IV SOLN: 250.0000 mL | INTRAVENOUS | Status: DC

## 2017-09-30 MED ORDER — ARTIFICIAL TEARS OPHTHALMIC OINT
TOPICAL_OINTMENT | OPHTHALMIC | Status: AC
  Filled 2017-09-30: qty 7

## 2017-09-30 MED ORDER — OXYCODONE-ACETAMINOPHEN 5-325 MG PO TABS
ORAL_TABLET | ORAL | Status: AC
  Filled 2017-09-30: qty 2

## 2017-09-30 MED ORDER — LIDOCAINE-EPINEPHRINE 1 %-1:100000 IJ SOLN
INTRAMUSCULAR | Status: DC | PRN
  Administered 2017-09-30: 2 mL

## 2017-09-30 MED ORDER — BUPIVACAINE LIPOSOME 1.3 % IJ SUSP
20.0000 mL | Freq: Once | INTRAMUSCULAR | Status: DC
  Filled 2017-09-30: qty 20

## 2017-09-30 MED ORDER — KETOROLAC TROMETHAMINE 30 MG/ML IJ SOLN
INTRAMUSCULAR | Status: AC
  Filled 2017-09-30: qty 1

## 2017-09-30 MED ORDER — ACETAMINOPHEN 650 MG RE SUPP: 650.0000 mg | RECTAL | Status: DC | PRN

## 2017-09-30 MED ORDER — PROPOFOL 10 MG/ML IV BOLUS
INTRAVENOUS | Status: AC
  Filled 2017-09-30: qty 20

## 2017-09-30 MED ORDER — MEPERIDINE HCL 25 MG/ML IJ SOLN: 6.2500 mg | INTRAMUSCULAR | Status: DC | PRN

## 2017-09-30 MED ORDER — CEFAZOLIN SODIUM-DEXTROSE 2-4 GM/100ML-% IV SOLN
2.0000 g | INTRAVENOUS | Status: AC
  Administered 2017-09-30: 2 g via INTRAVENOUS
  Filled 2017-09-30: qty 100

## 2017-09-30 MED ORDER — BUPIVACAINE HCL (PF) 0.25 % IJ SOLN
INTRAMUSCULAR | Status: AC
  Filled 2017-09-30: qty 30

## 2017-09-30 MED ORDER — HYDROMORPHONE 1 MG/ML IV SOLN
INTRAVENOUS | Status: DC
  Administered 2017-10-01: 1.4 mg via INTRAVENOUS
  Administered 2017-10-01: 2.2 mg via INTRAVENOUS

## 2017-09-30 MED ORDER — DIPHENHYDRAMINE HCL 12.5 MG/5ML PO ELIX: 12.5000 mg | ORAL_SOLUTION | Freq: Four times a day (QID) | ORAL | Status: DC | PRN

## 2017-09-30 MED ORDER — DIPHENHYDRAMINE HCL 50 MG/ML IJ SOLN: 12.5000 mg | Freq: Four times a day (QID) | INTRAMUSCULAR | Status: DC | PRN

## 2017-09-30 MED ORDER — HYDROMORPHONE 1 MG/ML IV SOLN
INTRAVENOUS | Status: AC
  Administered 2017-09-30: 21:00:00
  Filled 2017-09-30: qty 25

## 2017-09-30 MED ORDER — ROCURONIUM BROMIDE 100 MG/10ML IV SOLN
INTRAVENOUS | Status: DC | PRN
  Administered 2017-09-30 (×2): 20 mg via INTRAVENOUS
  Administered 2017-09-30: 50 mg via INTRAVENOUS
  Administered 2017-09-30 (×3): 10 mg via INTRAVENOUS

## 2017-09-30 MED ORDER — BUPIVACAINE HCL (PF) 0.25 % IJ SOLN
INTRAMUSCULAR | Status: DC | PRN
  Administered 2017-09-30: 2 mL

## 2017-09-30 MED ORDER — KETAMINE HCL-SODIUM CHLORIDE 100-0.9 MG/10ML-% IV SOSY
PREFILLED_SYRINGE | INTRAVENOUS | Status: AC
  Filled 2017-09-30: qty 10

## 2017-09-30 MED ORDER — PANTOPRAZOLE SODIUM 40 MG IV SOLR
40.0000 mg | Freq: Every day | INTRAVENOUS | Status: DC
  Administered 2017-09-30 – 2017-10-02 (×3): 40 mg via INTRAVENOUS
  Filled 2017-09-30 (×3): qty 40

## 2017-09-30 MED ORDER — 0.9 % SODIUM CHLORIDE (POUR BTL) OPTIME
TOPICAL | Status: DC | PRN
  Administered 2017-09-30: 1000 mL

## 2017-09-30 MED ORDER — ACETAMINOPHEN 325 MG PO TABS: 650.0000 mg | ORAL_TABLET | ORAL | Status: DC | PRN

## 2017-09-30 MED ORDER — HYDROMORPHONE HCL 1 MG/ML IJ SOLN
1.0000 mg | INTRAMUSCULAR | Status: DC | PRN
  Administered 2017-09-30 – 2017-10-03 (×13): 1 mg via INTRAVENOUS
  Filled 2017-09-30 (×12): qty 1

## 2017-09-30 MED ORDER — MIDAZOLAM HCL 2 MG/2ML IJ SOLN
INTRAMUSCULAR | Status: AC
  Filled 2017-09-30: qty 2

## 2017-09-30 MED ORDER — OXYCODONE-ACETAMINOPHEN 5-325 MG PO TABS
1.0000 | ORAL_TABLET | ORAL | Status: DC | PRN
  Administered 2017-09-30: 2 via ORAL

## 2017-09-30 MED ORDER — MENTHOL 3 MG MT LOZG: 1.0000 | LOZENGE | OROMUCOSAL | Status: DC | PRN

## 2017-09-30 MED ORDER — MIDAZOLAM HCL 2 MG/2ML IJ SOLN
1.0000 mg | Freq: Once | INTRAMUSCULAR | Status: AC
  Administered 2017-09-30: 1 mg via INTRAVENOUS

## 2017-09-30 MED ORDER — KETOROLAC TROMETHAMINE 30 MG/ML IJ SOLN
30.0000 mg | Freq: Once | INTRAMUSCULAR | Status: AC
  Administered 2017-09-30: 30 mg via INTRAVENOUS

## 2017-09-30 MED ORDER — SODIUM CHLORIDE 0.9% FLUSH
3.0000 mL | INTRAVENOUS | Status: DC | PRN
  Administered 2017-10-03: 3 mL via INTRAVENOUS
  Filled 2017-09-30: qty 3

## 2017-09-30 MED ORDER — NALOXONE HCL 0.4 MG/ML IJ SOLN: 0.4000 mg | INTRAMUSCULAR | Status: DC | PRN

## 2017-09-30 MED ORDER — ACETAMINOPHEN 10 MG/ML IV SOLN
INTRAVENOUS | Status: AC
  Filled 2017-09-30: qty 100

## 2017-09-30 MED ORDER — MIDAZOLAM HCL 2 MG/2ML IJ SOLN
INTRAMUSCULAR | Status: AC
  Administered 2017-09-30: 1 mg via INTRAVENOUS
  Filled 2017-09-30: qty 2

## 2017-09-30 MED ORDER — CYCLOBENZAPRINE HCL 10 MG PO TABS
10.0000 mg | ORAL_TABLET | Freq: Three times a day (TID) | ORAL | Status: DC | PRN
  Administered 2017-10-01 – 2017-10-03 (×4): 10 mg via ORAL
  Filled 2017-09-30 (×5): qty 1

## 2017-09-30 MED ORDER — VANCOMYCIN HCL 1000 MG IV SOLR
INTRAVENOUS | Status: DC | PRN
  Administered 2017-09-30: 1000 mg

## 2017-09-30 MED ORDER — OXYCODONE HCL 5 MG PO TABS
15.0000 mg | ORAL_TABLET | ORAL | Status: DC | PRN
  Administered 2017-09-30 – 2017-10-01 (×4): 15 mg via ORAL
  Filled 2017-09-30 (×3): qty 3

## 2017-09-30 MED ORDER — SODIUM CHLORIDE 0.9 % IR SOLN
Status: DC | PRN
  Administered 2017-09-30: 500 mL

## 2017-09-30 MED ORDER — SODIUM CHLORIDE 0.9 % IJ SOLN
INTRAMUSCULAR | Status: AC
  Filled 2017-09-30: qty 10

## 2017-09-30 MED ORDER — CEFAZOLIN SODIUM-DEXTROSE 2-4 GM/100ML-% IV SOLN
2.0000 g | Freq: Three times a day (TID) | INTRAVENOUS | Status: AC
  Administered 2017-09-30 – 2017-10-02 (×5): 2 g via INTRAVENOUS
  Filled 2017-09-30 (×7): qty 100

## 2017-09-30 MED ORDER — ACETAMINOPHEN 325 MG PO TABS: 650.0000 mg | ORAL_TABLET | Freq: Four times a day (QID) | ORAL | Status: DC | PRN

## 2017-09-30 MED ORDER — IBUPROFEN 200 MG PO TABS
400.0000 mg | ORAL_TABLET | Freq: Four times a day (QID) | ORAL | Status: DC | PRN
  Administered 2017-09-30: 400 mg via ORAL
  Filled 2017-09-30: qty 2

## 2017-09-30 MED ORDER — ONDANSETRON HCL 4 MG PO TABS: 4.0000 mg | ORAL_TABLET | Freq: Four times a day (QID) | ORAL | Status: DC | PRN

## 2017-09-30 MED ORDER — VANCOMYCIN HCL 1000 MG IV SOLR
INTRAVENOUS | Status: AC
  Filled 2017-09-30: qty 1000

## 2017-09-30 MED ORDER — ALUM & MAG HYDROXIDE-SIMETH 200-200-20 MG/5ML PO SUSP: 30.0000 mL | Freq: Four times a day (QID) | ORAL | Status: DC | PRN

## 2017-09-30 MED ORDER — ONDANSETRON HCL 4 MG/2ML IJ SOLN
INTRAMUSCULAR | Status: AC
  Filled 2017-09-30: qty 2

## 2017-09-30 MED ORDER — THROMBIN (RECOMBINANT) 20000 UNITS EX SOLR
CUTANEOUS | Status: DC | PRN
  Administered 2017-09-30: 20 mL via TOPICAL

## 2017-09-30 MED ORDER — ACETAMINOPHEN 10 MG/ML IV SOLN
1000.0000 mg | Freq: Once | INTRAVENOUS | Status: AC
  Administered 2017-09-30: 1000 mg via INTRAVENOUS

## 2017-09-30 MED ORDER — BUPRENORPHINE HCL 2 MG SL SUBL
2.0000 mg | SUBLINGUAL_TABLET | Freq: Every day | SUBLINGUAL | Status: DC
  Filled 2017-09-30: qty 1

## 2017-09-30 MED ORDER — LIDOCAINE 2% (20 MG/ML) 5 ML SYRINGE
INTRAMUSCULAR | Status: DC | PRN
  Administered 2017-09-30: 60 mg via INTRAVENOUS

## 2017-09-30 MED ORDER — OXYCODONE HCL 5 MG PO TABS
ORAL_TABLET | ORAL | Status: AC
  Administered 2017-09-30: 15 mg via ORAL
  Filled 2017-09-30: qty 3

## 2017-09-30 MED ORDER — HYDROMORPHONE HCL 1 MG/ML IJ SOLN
0.2500 mg | INTRAMUSCULAR | Status: DC | PRN
  Administered 2017-09-30 (×4): 0.5 mg via INTRAVENOUS

## 2017-09-30 MED ORDER — DEXAMETHASONE SODIUM PHOSPHATE 10 MG/ML IJ SOLN
10.0000 mg | INTRAMUSCULAR | Status: AC
  Administered 2017-09-30: 10 mg via INTRAVENOUS
  Filled 2017-09-30: qty 1

## 2017-09-30 MED ORDER — KETAMINE HCL 10 MG/ML IJ SOLN
INTRAMUSCULAR | Status: DC | PRN
  Administered 2017-09-30: 20 mg via INTRAVENOUS
  Administered 2017-09-30: 40 mg via INTRAVENOUS

## 2017-09-30 SURGICAL SUPPLY — 74 items
BAG DECANTER FOR FLEXI CONT (MISCELLANEOUS) ×3 IMPLANT
BENZOIN TINCTURE PRP APPL 2/3 (GAUZE/BANDAGES/DRESSINGS) ×3 IMPLANT
BLADE CLIPPER SURG (BLADE) IMPLANT
BLADE SURG 11 STRL SS (BLADE) ×3 IMPLANT
BONE VIVIGEN FORMABLE 5.4CC (Bone Implant) ×3 IMPLANT
BUR CUTTER 7.0 ROUND (BURR) ×3 IMPLANT
BUR MATCHSTICK NEURO 3.0 LAGG (BURR) ×3 IMPLANT
CAGE RISE 11-17-15 10X22 (Cage) ×6 IMPLANT
CANISTER SUCT 3000ML PPV (MISCELLANEOUS) ×3 IMPLANT
CAP LOCKING THREADED (Cap) ×12 IMPLANT
CARTRIDGE OIL MAESTRO DRILL (MISCELLANEOUS) ×1 IMPLANT
CLOSURE WOUND 1/2 X4 (GAUZE/BANDAGES/DRESSINGS) ×1
CONT SPEC 4OZ CLIKSEAL STRL BL (MISCELLANEOUS) ×9 IMPLANT
COVER BACK TABLE 60X90IN (DRAPES) ×3 IMPLANT
DECANTER SPIKE VIAL GLASS SM (MISCELLANEOUS) ×3 IMPLANT
DERMABOND ADVANCED (GAUZE/BANDAGES/DRESSINGS) ×2
DERMABOND ADVANCED .7 DNX12 (GAUZE/BANDAGES/DRESSINGS) ×1 IMPLANT
DIFFUSER DRILL AIR PNEUMATIC (MISCELLANEOUS) ×3 IMPLANT
DRAPE C-ARM 42X72 X-RAY (DRAPES) ×3 IMPLANT
DRAPE C-ARMOR (DRAPES) ×3 IMPLANT
DRAPE HALF SHEET 40X57 (DRAPES) IMPLANT
DRAPE LAPAROTOMY 100X72X124 (DRAPES) ×3 IMPLANT
DRAPE POUCH INSTRU U-SHP 10X18 (DRAPES) ×3 IMPLANT
DRAPE SURG 17X23 STRL (DRAPES) ×3 IMPLANT
DRSG OPSITE POSTOP 4X6 (GAUZE/BANDAGES/DRESSINGS) ×3 IMPLANT
DURAPREP 26ML APPLICATOR (WOUND CARE) ×3 IMPLANT
ELECT BLADE 4.0 EZ CLEAN MEGAD (MISCELLANEOUS) ×3
ELECT REM PT RETURN 9FT ADLT (ELECTROSURGICAL) ×3
ELECTRODE BLDE 4.0 EZ CLN MEGD (MISCELLANEOUS) ×1 IMPLANT
ELECTRODE REM PT RTRN 9FT ADLT (ELECTROSURGICAL) ×1 IMPLANT
EVACUATOR 1/8 PVC DRAIN (DRAIN) ×3 IMPLANT
EVACUATOR 3/16  PVC DRAIN (DRAIN)
EVACUATOR 3/16 PVC DRAIN (DRAIN) IMPLANT
GAUZE SPONGE 4X4 12PLY STRL (GAUZE/BANDAGES/DRESSINGS) ×3 IMPLANT
GAUZE SPONGE 4X4 16PLY XRAY LF (GAUZE/BANDAGES/DRESSINGS) ×3 IMPLANT
GLOVE BIO SURGEON STRL SZ7 (GLOVE) ×3 IMPLANT
GLOVE BIO SURGEON STRL SZ8 (GLOVE) ×6 IMPLANT
GLOVE BIOGEL PI IND STRL 7.0 (GLOVE) ×1 IMPLANT
GLOVE BIOGEL PI INDICATOR 7.0 (GLOVE) ×2
GLOVE ECLIPSE 7.5 STRL STRAW (GLOVE) IMPLANT
GLOVE EXAM NITRILE LRG STRL (GLOVE) IMPLANT
GLOVE EXAM NITRILE XL STR (GLOVE) IMPLANT
GLOVE EXAM NITRILE XS STR PU (GLOVE) IMPLANT
GLOVE INDICATOR 8.5 STRL (GLOVE) ×6 IMPLANT
GOWN STRL REUS W/ TWL LRG LVL3 (GOWN DISPOSABLE) ×2 IMPLANT
GOWN STRL REUS W/ TWL XL LVL3 (GOWN DISPOSABLE) ×2 IMPLANT
GOWN STRL REUS W/TWL 2XL LVL3 (GOWN DISPOSABLE) IMPLANT
GOWN STRL REUS W/TWL LRG LVL3 (GOWN DISPOSABLE) ×4
GOWN STRL REUS W/TWL XL LVL3 (GOWN DISPOSABLE) ×4
HEMOSTAT POWDER KIT SURGIFOAM (HEMOSTASIS) IMPLANT
KIT BASIN OR (CUSTOM PROCEDURE TRAY) ×3 IMPLANT
KIT ROOM TURNOVER OR (KITS) ×3 IMPLANT
MILL MEDIUM DISP (BLADE) ×3 IMPLANT
NEEDLE HYPO 21X1.5 SAFETY (NEEDLE) ×3 IMPLANT
NEEDLE HYPO 25X1 1.5 SAFETY (NEEDLE) ×3 IMPLANT
NS IRRIG 1000ML POUR BTL (IV SOLUTION) ×3 IMPLANT
OIL CARTRIDGE MAESTRO DRILL (MISCELLANEOUS) ×3
PACK LAMINECTOMY NEURO (CUSTOM PROCEDURE TRAY) ×3 IMPLANT
PAD ARMBOARD 7.5X6 YLW CONV (MISCELLANEOUS) ×9 IMPLANT
ROD CREO 50MM (Rod) ×6 IMPLANT
SCREW 6.5X45 (Screw) ×9 IMPLANT
SCREW POLY THRD CREO 6.5X40 (Screw) ×3 IMPLANT
SPONGE LAP 4X18 X RAY DECT (DISPOSABLE) IMPLANT
SPONGE SURGIFOAM ABS GEL 100 (HEMOSTASIS) ×3 IMPLANT
STRIP CLOSURE SKIN 1/2X4 (GAUZE/BANDAGES/DRESSINGS) ×2 IMPLANT
SUT VIC AB 0 CT1 18XCR BRD8 (SUTURE) ×2 IMPLANT
SUT VIC AB 0 CT1 8-18 (SUTURE) ×4
SUT VIC AB 2-0 CT1 18 (SUTURE) ×3 IMPLANT
SUT VIC AB 4-0 PS2 27 (SUTURE) ×3 IMPLANT
SYR 20CC LL (SYRINGE) ×3 IMPLANT
TOWEL GREEN STERILE (TOWEL DISPOSABLE) ×3 IMPLANT
TOWEL GREEN STERILE FF (TOWEL DISPOSABLE) ×3 IMPLANT
TRAY FOLEY W/METER SILVER 16FR (SET/KITS/TRAYS/PACK) ×3 IMPLANT
WATER STERILE IRR 1000ML POUR (IV SOLUTION) ×3 IMPLANT

## 2017-09-30 NOTE — Anesthesia Preprocedure Evaluation (Addendum)
Anesthesia Evaluation  Patient identified by MRN, date of birth, ID band Patient awake    Reviewed: Allergy & Precautions, NPO status , Patient's Chart, lab work & pertinent test results  Airway Mallampati: II  TM Distance: >3 FB Neck ROM: Full    Dental no notable dental hx. (+) Poor Dentition   Pulmonary pneumonia, resolved, Current Smoker,    Pulmonary exam normal breath sounds clear to auscultation       Cardiovascular negative cardio ROS Normal cardiovascular exam Rhythm:Regular Rate:Normal     Neuro/Psych PSYCHIATRIC DISORDERS Anxiety Depression negative neurological ROS     GI/Hepatic negative GI ROS, (+)     substance abuse  , On suboxone - last dose 30 hrs ago   Endo/Other  Obesity   Renal/GU negative Renal ROS  negative genitourinary   Musculoskeletal  (+) Arthritis , Osteoarthritis,  DDD lumbar spine HNP L4-5   Abdominal   Peds  Hematology negative hematology ROS (+)   Anesthesia Other Findings   Reproductive/Obstetrics                            Anesthesia Physical Anesthesia Plan  ASA: II  Anesthesia Plan: General   Post-op Pain Management:    Induction: Intravenous  PONV Risk Score and Plan: 3 and Ondansetron, Dexamethasone, Midazolam and Propofol infusion  Airway Management Planned: Oral ETT and Video Laryngoscope Planned  Additional Equipment:   Intra-op Plan:   Post-operative Plan: Extubation in OR  Informed Consent: I have reviewed the patients History and Physical, chart, labs and discussed the procedure including the risks, benefits and alternatives for the proposed anesthesia with the patient or authorized representative who has indicated his/her understanding and acceptance.   Dental advisory given  Plan Discussed with: CRNA, Anesthesiologist and Surgeon  Anesthesia Plan Comments:        Anesthesia Quick Evaluation

## 2017-09-30 NOTE — H&P (Signed)
Crystal Acosta is an 36 y.o. female.   Chief Complaint: back and left greater than right leg pain HPI: 36 year old female previously undergone L5-S1 fusion a large disc herniation L4-5 underwent discectomy did very well however last few weeks this had progressive worsening left leg pain workup revealed a very large recurrent disc herniation so due to the fact this is a disc space above her previous fusion and surgery recurrence I recommended decompression stabilization procedure. I extensively reviewed the risks and benefits of the operation the patient as well as perioperative course expectations of outcome and alternatives of surgery she understands and agrees to proceed forward.  Past Medical History:  Diagnosis Date  . Anxiety    anxiety disorder  . Bulging lumbar disc   . 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 Depression   . Gallstones   . H/O varicella   . Insomnia   . Pneumonia 2016    Past Surgical History:  Procedure Laterality Date  . ABDOMINAL EXPOSURE N/A 10/10/2014   Procedure: ABDOMINAL EXPOSURE;  Surgeon: Larina Earthly, MD;  Location: [facility];  Service: Vascular;  Laterality: N/A;  ABDOMINAL EXPOSURE  . ANTERIOR LUMBAR FUSION N/A 10/10/2014   Procedure: ANTERIOR LUMBAR FUSION 1 LEVEL;  Surgeon: Mariam Dollar, MD;  Location: [facility];  Service: Neurosurgery;  Laterality: N/A;  ANTERIOR LUMBAR FUSION 1 LEVEL LUMBAR 5-SACRAL 1  . BACK SURGERY     lumber disc  . CHOLECYSTECTOMY    . CHOLECYSTECTOMY  07/11/2012   Procedure: LAPAROSCOPIC CHOLECYSTECTOMY WITH INTRAOPERATIVE CHOLANGIOGRAM;  Surgeon: Almond Lint, MD;  Location: [facility];  Service: General;  Laterality: N/A;  . ERCP  07/12/2012   Procedure: ENDOSCOPIC RETROGRADE CHOLANGIOPANCREATOGRAPHY (ERCP);  Surgeon: Petra Kuba, MD;  Location: [facility];  Service: Endoscopy;  Laterality: N/A;  . ESOPHAGOGASTRODUODENOSCOPY  08/01/2012   Procedure: ESOPHAGOGASTRODUODENOSCOPY (EGD);  Surgeon: Florencia Reasons, MD;   Location: [facility];  Service: Endoscopy;  Laterality: N/A;  . LUMBAR LAMINECTOMY/DECOMPRESSION MICRODISCECTOMY Left 01/20/2017   Procedure: Left Lumbar Four-Five Microdiscectomy;  Surgeon: Donalee Citrin, MD;  Location: [facility] OR;  Service: Neurosurgery;  Laterality: Left;    Family History  Problem Relation Age of Onset  . Hypertension Mother   . Heart disease Maternal Grandfather    Social History:  reports that she has been smoking Cigarettes.  She has a 15.00 pack-year smoking history. She has never used smokeless tobacco. She reports that she uses drugs, including Oxycodone and Hydromorphone. She reports that she does not drink alcohol.  Allergies:  Allergies  Allergen Reactions  . Questran [Cholestyramine] Hives    Medications Prior to Admission  Medication Sig Dispense Refill  . acetaminophen (TYLENOL) 325 MG tablet Take 650 mg by mouth every 6 (six) hours as needed for moderate pain or headache.    . Buprenorphine HCl-Naloxone HCl (SUBOXONE) 8-2 MG FILM Place 0.5-0.75 Film under the tongue See admin instructions. Take 0.5 film in the morning, 0.5 film at noon, and 0.75 film at night     . ibuprofen (ADVIL,MOTRIN) 200 MG tablet Take 400 mg by mouth every 6 (six) hours as needed for headache or moderate pain.    . cyclobenzaprine (FLEXERIL) 10 MG tablet Take 1 tablet (10 mg total) by mouth 3 (three) times daily as needed for muscle spasms. (Patient not taking: Reported on 09/23/2017) 40 tablet 0  . oxyCODONE-acetaminophen (PERCOCET/ROXICET) 5-325 MG tablet Take 1-2 tablets by mouth every 4 (four) hours as needed for moderate pain. (Patient not taking:  Reported on 09/23/2017) 60 tablet 0    No results found for this or any previous visit (from the past 48 hour(s)). No results found.  Review of Systems  Musculoskeletal: Positive for back pain.  Neurological: Positive for tingling, sensory change and focal weakness.    Blood pressure 120/70, pulse 97, temperature 98.6 F (37 C),  temperature source Oral, resp. rate 18, height 5\' 6"  (1.676 m), weight 95.4 kg (210 lb 6.4 oz), last menstrual period 09/02/2017, SpO2 97 %. Physical Exam  Constitutional: She is oriented to person, place, and time. She appears well-developed.  HENT:  Head: Normocephalic.  Eyes: Pupils are equal, round, and reactive to light.  Neck: Normal range of motion.  Cardiovascular: Normal rate.   Respiratory: Effort normal.  GI: Soft.  Neurological: She is alert and oriented to person, place, and time. GCS eye subscore is 4. GCS verbal subscore is 5. GCS motor subscore is 6.  Strength the right looks M is 5 out of 5 iliopsoas, quads, hip she's, gastric, and tibialis, and EHL.left lower extremity has baseline left foot drop but otherwise 5 out of 5     Assessment/Plan [age] year female presents for redo decompressive laminectomy discectomy and interbody fusion. At L4-5.  Ewald Beg P, MD 09/30/2017, 12:24 PM

## 2017-09-30 NOTE — Op Note (Signed)
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  EBL: Minimal  History of present I Patient is very pleasant 36 year old female with long-standing back andbilateral leg pain previously u interbody fusion and discectomy at L4-5 recurrent disc is large and due to patient's failure of conservative treatment ima and progressive clinical syndrome I recommended redo laminectomy and discectomy at L4-5 interbody fusion at that level.I extensively went over the risksthe operationith the patiell as perioperative course expectations of outcome and alternatives surgery and she understands and agrees to proceed forward.   Operative procedure:patient was brought into tas induced under general anesthesia and positioned prone on the Wilsonframe. Her back was prepped and draped in routine sle fashion. Her previous the scar was opened up and extended cephalad caudally and subperiosteal dissection was carried out on the lamina of L4 and L5 this was confirmed with interoperative. Spinous process was then removed centralion was begun first working on the right side complete medial facetectomy and radical foraminotomies were performed and the scar tissues dissected free and a complete medial facetectomy with radioraminotomies were performed on the left side working around t scar tissue. Epidural veins were coagulated. Disc as incised. A large rec  disc was removed and the left s the disc space was cleaned out radically from both sides then endplates were scraped and prepared t.Utilizing sequential distrac with an 11 distractor in Place, a 10-17 expandable cage was inserted the distractorwas then removed the contralateral disc was prepared the autograft mix was packed centrally and a contralateral cage was inserted.Both cages were expanded and a simialar fashion.Attention was then taken to pedicle screw placement.Pilot holes were drilled pedicles were cannulatedprobed and tapped  With a 55 tap. Then 6 5 x 45 screws were inserted L4 and 6 5 x 45 and 6 5 x 40 on the left.Then after all the screws and then placed the wound was copiously irrigated.The TPs were aggressively decorticated bone was packed posterior laterally. Rods were then placed top tightening nuts were anchored the foraminal reinspected to confirm no migration of graft material and confirm patency. Gelfoam was overlaid top of the dura the wound was sprinkled with vancomycin powder a Hemovac drain was placedand the wounds closed in layerswith interrupted Vicryl. Dermabond benzo and Steri-Strips and sterile dressing was applied and patient went to recovery room in stable condition. At the end the case all needle counts and sponge counts

## 2017-09-30 NOTE — Transfer of Care (Signed)
Immediate Anesthesia Transfer of Care Note  Patient: Crystal SamsBrean D Feeny  Procedure(s) Performed: Posterior Lumbar Interbody Fusion  - Lumbar four-five (N/A Back)  Patient Location: PACU  Anesthesia Type:General  Level of Consciousness: awake, alert  and oriented  Airway & Oxygen Therapy: Patient Spontanous Breathing and Patient connected to nasal cannula oxygen  Post-op Assessment: Report given to RN and Post -op Vital signs reviewed and stable  Post vital signs: Reviewed and stable  Last Vitals:  Vitals:   09/30/17 1041 09/30/17 1644  BP: 120/70   Pulse: 97   Resp: 18   Temp: 37 C 36.6 C  SpO2: 97%     Last Pain:  Vitals:   09/30/17 1644  TempSrc:   PainSc: (P) 10-Worst pain ever      Patients Stated Pain Goal: 3 (09/30/17 1113)  Complications: No apparent anesthesia complications

## 2017-09-30 NOTE — Anesthesia Procedure Notes (Signed)
Procedure Name: Intubation Date/Time: 09/30/2017 1:18 PM Performed by: Merrilyn Puma B Pre-anesthesia Checklist: Patient identified, Emergency Drugs available, Suction available, Patient being monitored and Timeout performed Patient Re-evaluated:Patient Re-evaluated prior to induction Oxygen Delivery Method: Circle system utilized Preoxygenation: Pre-oxygenation with 100% oxygen Induction Type: IV induction and Cricoid Pressure applied Ventilation: Mask ventilation without difficulty Laryngoscope Size: Mac and 3 Grade View: Grade II Tube type: Oral Tube size: 7.0 mm Number of attempts: 1 Airway Equipment and Method: Stylet Placement Confirmation: ETT inserted through vocal cords under direct vision,  positive ETCO2,  CO2 detector and breath sounds checked- equal and bilateral Secured at: 21 cm Tube secured with: Tape Dental Injury: Teeth and Oropharynx as per pre-operative assessment

## 2017-10-01 ENCOUNTER — Encounter (HOSPITAL_COMMUNITY): Payer: Self-pay

## 2017-10-01 MED ORDER — INFLUENZA VAC SPLIT QUAD 0.5 ML IM SUSY: 0.5000 mL | PREFILLED_SYRINGE | INTRAMUSCULAR | Status: DC

## 2017-10-01 MED ORDER — SODIUM CHLORIDE 0.9 % IV BOLUS (SEPSIS)
1000.0000 mL | Freq: Once | INTRAVENOUS | Status: AC
  Administered 2017-10-01: 1000 mL via INTRAVENOUS

## 2017-10-01 MED ORDER — HYDROMORPHONE HCL 2 MG PO TABS
4.0000 mg | ORAL_TABLET | ORAL | Status: DC | PRN
  Administered 2017-10-01 – 2017-10-04 (×15): 4 mg via ORAL
  Filled 2017-10-01 (×16): qty 2

## 2017-10-01 NOTE — Progress Notes (Signed)
Paged and spoke with neurosurgery PA to clarify brace orders. New orders received.

## 2017-10-01 NOTE — Progress Notes (Signed)
Brace delivered by ortho tech to bedside.

## 2017-10-01 NOTE — Progress Notes (Signed)
Patient is alert and verbal, had a PLIF surgery for L4-5 redone on the 10/18, has a honeycomb dressing on site, has a hemo vac drain in place, transfer from [facility] [place], is on tele box 6. Is currently resting in bed.

## 2017-10-01 NOTE — Evaluation (Signed)
Occupational Therapy Evaluation Patient Details Name: Crystal Acosta MRN: [id number] DOB: 1981/09/02 Today's Date: 10/01/2017    History of Present Illness Pt is a 36 y.o. female s/p PLIF L4-5. PMH consists of PLIF x 2, discectomy L4-5, anxiety and depression.   Clinical Impression   PTA Pt independent in ADL and mobility. Pt is currently max A for LB ADL and min A for transfers (benefits from RW for stability). Back handout provided and reviewed adls in detail. Pt educated on: clothing between brace, never sleep in brace, set an alarm at night for medication, avoid sitting for long periods of time, correct bed positioning for sleeping, correct sequence for bed mobility, avoiding lifting more than 5 pounds and never wash directly over incision. Pt will benefit from skilled OT in the acute setting to maximize safety and independence in ADL and functional transfers. Next session to provide education on 3in1 and also intro AE (Pt is allowed to have kit).     Follow Up Recommendations  Supervision/Assistance - 24 hour (initially)    Equipment Recommendations  3 in 1 bedside commode    Recommendations for Other Services       Precautions / Restrictions Precautions Precautions: Back Precaution Booklet Issued: Yes (comment) Precaution Comments: Pt educated on 3/3 back precautions. Handout provided. Required Braces or Orthoses: Spinal Brace Spinal Brace: Lumbar corset (for ambulation - no orders in chart) Restrictions Weight Bearing Restrictions: No Other Position/Activity Restrictions: MD order for no brace needed.      Mobility Bed Mobility Overal bed mobility: Needs Assistance Bed Mobility: Rolling;Sidelying to Sit;Sit to Sidelying Rolling: Min assist Sidelying to sit: Min assist (assist to elevate trunk)     Sit to sidelying: Mod assist (assist for BLE into bed, vc for sequencing/safety) General bed mobility comments: educated on logroll technique, verbal cues for sequencing  and precautions  Transfers Overall transfer level: Needs assistance Equipment used: Rolling walker (2 wheeled) Transfers: Sit to/from Stand Sit to Stand: Min assist         General transfer comment: assist to power up, increased time to stabilize initial standing balance    Balance Overall balance assessment: Needs assistance Sitting-balance support: No upper extremity supported;Feet supported Sitting balance-Leahy Scale: Good Sitting balance - Comments: sitting EOB with no back support   Standing balance support: Bilateral upper extremity supported;During functional activity Standing balance-Leahy Scale: Fair Standing balance comment: benefits from BUE on RW for stability                           ADL either performed or assessed with clinical judgement   ADL Overall ADL's : Needs assistance/impaired Eating/Feeding: Set up   Grooming: Wash/dry hands;Wash/dry face;Set up;Sitting   Upper Body Bathing: Moderate assistance   Lower Body Bathing: Maximal assistance;Sit to/from stand   Upper Body Dressing : Min guard;Sitting Upper Body Dressing Details (indicate cue type and reason): EOB to don brace Lower Body Dressing: Maximal assistance;Sit to/from stand Lower Body Dressing Details (indicate cue type and reason): unable to bring feet cross knees                     Vision Patient Visual Report: No change from baseline       Perception     Praxis      Pertinent Vitals/Pain Pain Assessment: 0-10 Pain Score: 7  Pain Location: back Pain Descriptors / Indicators: Aching;Sore;Guarding;Grimacing;Crying Pain Intervention(s): Monitored during session;Repositioned;RN gave pain meds during session  Hand Dominance Right   Extremity/Trunk Assessment Upper Extremity Assessment Upper Extremity Assessment: Overall WFL for tasks assessed   Lower Extremity Assessment Lower Extremity Assessment: Defer to PT evaluation LLE Deficits / Details: foot  drop   Cervical / Trunk Assessment Cervical / Trunk Assessment: Normal   Communication Communication Communication: No difficulties   Cognition Arousal/Alertness: Awake/alert Behavior During Therapy: WFL for tasks assessed/performed;Anxious Overall Cognitive Status: Within Functional Limits for tasks assessed                                     General Comments  Mother in room during session    Exercises     Shoulder [place] expects to be discharged to:: Private residence Living Arrangements: Parent;Children (53 year old son) Available Help at Discharge: Family;Available 24 hours/day Type of Home: House Home Access: Stairs to enter [employer] of Steps: 5 Entrance Stairs-Rails: Right;Left Home Layout: One level     Bathroom Shower/Tub: Tub/shower unit;Walk-in Psychologist, prison and probation services: Standard     Home Equipment: Shower seat - built in          Prior Functioning/Environment Level of Independence: Independent                 OT Problem List: Decreased range of motion;Decreased activity tolerance;Impaired balance (sitting and/or standing);Decreased safety awareness;Decreased knowledge of use of DME or AE;Decreased knowledge of precautions;Pain;Obesity      OT Treatment/Interventions: Self-care/ADL training;DME and/or AE instruction;Therapeutic activities;Patient/family education;Balance training    OT Goals(Current goals can be found in the care plan section) Acute Rehab OT Goals Patient Stated Goal: decrease pain OT Goal Formulation: With patient/family Time For Goal Achievement: 10/13/17 Potential to Achieve Goals: Good ADL Goals Pt Will Perform Grooming: with supervision;standing Pt Will Perform Lower Body Bathing: with min assist;with adaptive equipment;sit to/from stand;with caregiver independent in assisting Pt Will Perform Lower Body Dressing: with min guard assist;with adaptive  equipment;sit to/from stand Pt Will Transfer to Toilet: with modified independence;ambulating Pt Will Perform Toileting - Clothing Manipulation and hygiene: with min assist;with adaptive equipment;sit to/from stand Additional ADL Goal #1: Pt will recall 3/3 back precautions and maintain during ADL with less than 2 verbal cues Additional ADL Goal #2: Pt will perform bed mobility maintaining back precautions at supervision level prior to ADL  OT Frequency: Min 2X/week   Barriers to D/C:            Co-evaluation              AM-PAC PT "6 Clicks" Daily Activity     Outcome Measure Help from another person eating meals?: None Help from another person taking care of personal grooming?: None Help from another person toileting, which includes using toliet, bedpan, or urinal?: A Little Help from another person bathing (including washing, rinsing, drying)?: A Lot Help from another person to put on and taking off regular upper body clothing?: A Little Help from another person to put on and taking off regular lower body clothing?: A Lot 6 Click Score: 18   End of Session Equipment Utilized During Treatment: Gait belt;Rolling walker;Back brace Nurse Communication: Mobility status;Precautions  Activity Tolerance: Patient limited by pain Patient left: in bed;with call bell/phone within reach;with nursing/sitter in room;with family/visitor present  OT Visit Diagnosis: Unsteadiness on feet (R26.81);Other abnormalities of gait and mobility (R26.89);Pain Pain - part of body:  (back)  Time: 8257-4935 OT Time Calculation (min): 21 min Charges:  OT General Charges $OT Visit: 1 Visit OT Evaluation $OT Eval Moderate Complexity: 1 Mod G-Codes:     Hulda Humphrey OTR/L [place] 10/01/2017, 4:53 PM

## 2017-10-01 NOTE — Progress Notes (Signed)
Called [employer] to request brace.

## 2017-10-01 NOTE — Care Management Note (Signed)
Case Management Note  Patient Details  Name: Crystal Acosta MRN: [id number] Date of Birth: 12/31/1980  Subjective/Objective:    From home with mom, [name] [phone],  She is s/p  PLIF L4-5.  Per pt eval rec rolling walker.  Patient states [facility] is ok for her rolling walker, referral made to [facility].                 Action/Plan: NCM will follow for dc needs.   Expected Discharge Date:                  Expected Discharge Plan:  Home/Self Care  In-House Referral:     Discharge planning Services  CM Consult  Post Acute Care Choice:    Choice offered to:     DME Arranged:  Walker rolling DME Agency:  [facility]  [facility]     Status of Service:  Completed, signed off  If discussed at Long Length of Stay Meetings, dates discussed:    Additional Comments:  Leone Havenaylor, Sherrina Zaugg Clinton, RN 10/01/2017, 2:53 PM

## 2017-10-01 NOTE — Evaluation (Signed)
Physical Therapy Evaluation Patient Details Name: Crystal Acosta MRN: [id number] DOB: [birth year] Today's Date: 10/01/2017   History of Present Illness  Pt is a 36 y.o. female s/p PLIF L4-5. PMH consists of PLIF x 2, discectomy L4-5, anxiety and depression.  Clinical Impression  Patient is s/p above surgery resulting in the deficits listed below (see PT Problem List). On eval, pt required min/mod assist bed mobility, min assist transfers and hand-held min assist ambulation 25 feet in room. Mobility limited by pain, with pt reporting 7/10. She will need RW for hallway ambulation. Pt educated on 3/3 back precautions. Handout provided. Patient will benefit from skilled PT to increase their independence and safety with mobility (while adhering to their precautions) to allow discharge to the venue listed below. PT to follow acutely. No follow up services indicated.     Follow Up Recommendations Supervision/Assistance - 24 hour;No PT follow up    Equipment Recommendations  Rolling walker with 5" wheels    Recommendations for Other Services       Precautions / Restrictions Precautions Precautions: Back Precaution Booklet Issued: Yes (comment) Precaution Comments: Pt educated on 3/3 back precautions. Handout provided. Restrictions Other Position/Activity Restrictions: MD order for no brace needed.      Mobility  Bed Mobility Overal bed mobility: Needs Assistance Bed Mobility: Rolling;Sidelying to Sit Rolling: Min assist Sidelying to sit: Mod assist       General bed mobility comments: educated on logroll technique, verbal cues for sequencing and precautions  Transfers Overall transfer level: Needs assistance Equipment used: None Transfers: Sit to/from [employer] Sit to Stand: Min assist Stand pivot transfers: Min assist       General transfer comment: assist to power up, increased time to stabilize initial standing balance  Ambulation/Gait Ambulation/Gait  assistance: Min assist Ambulation Distance (Feet): 25 Feet Assistive device: 1 person hand held assist Gait Pattern/deviations: Step-through pattern;Decreased stride length Gait velocity: decreased Gait velocity interpretation: Below normal speed for age/gender General Gait Details: Pt ambulated in room HHA of 1. She will need RW for increased distances. Distance limited by pain this session.  Stairs            Wheelchair Mobility    Modified Rankin (Stroke Patients Only)       Balance Overall balance assessment: No apparent balance deficits (not formally assessed)                                           Pertinent Vitals/Pain Pain Assessment: 0-10 Pain Score: 7  Pain Location: back Pain Descriptors / Indicators: Aching;Sore;Guarding;Grimacing Pain Intervention(s): Monitored during session;Repositioned;Limited activity within patient's tolerance    Home Living Family/patient expects to be discharged to:: Private residence Living Arrangements: Parent;Children Available Help at Discharge: Family;Available 24 hours/day Type of Home: House Home Access: Stairs to enter Entrance Stairs-Rails: Doctor, general practice of Steps: 5 Home Layout: One level Home Equipment: Shower seat - built in      Prior Function Level of Independence: Independent         Comments: Pt has a 21 y.o. son. She lives with her mother.     Hand Dominance   Dominant Hand: Right    Extremity/Trunk Assessment        Lower Extremity Assessment Lower Extremity Assessment: LLE deficits/detail LLE Deficits / Details: foot drop    Cervical / Trunk Assessment Cervical / Trunk Assessment:  Normal  Communication   Communication: No difficulties  Cognition Arousal/Alertness: Awake/alert Behavior During Therapy: WFL for tasks assessed/performed Overall Cognitive Status: Within Functional Limits for tasks assessed                                         General Comments      Exercises     Assessment/Plan    PT Assessment    PT Problem List         PT Treatment Interventions      PT Goals (Current goals can be found in the Care Plan section)  Acute Rehab PT Goals Patient Stated Goal: decrease pain PT Goal Formulation: With patient Time For Goal Achievement: 10/08/17 Potential to Achieve Goals: Good    Frequency     Barriers to discharge        Co-evaluation               AM-PAC PT "6 Clicks" Daily Activity  Outcome Measure Difficulty turning over in bed (including adjusting bedclothes, sheets and blankets)?: A Lot Difficulty moving from lying on back to sitting on the side of the bed? : Unable Difficulty sitting down on and standing up from a chair with arms (e.g., wheelchair, bedside commode, etc,.)?: A Lot Help needed moving to and from a bed to chair (including a wheelchair)?: A Little Help needed walking in hospital room?: A Little Help needed climbing 3-5 steps with a railing? : A Lot 6 Click Score: 13    End of Session Equipment Utilized During Treatment: Gait belt Activity Tolerance: Patient limited by pain Patient left: in chair;with call bell/phone within reach Nurse Communication: Mobility status;Precautions PT Visit Diagnosis: Pain;Other abnormalities of gait and mobility (R26.89)    Time: 1030-1056 PT Time Calculation (min) (ACUTE ONLY): 26 min   Charges:   PT Evaluation $PT Eval Moderate Complexity: 1 Mod PT Treatments $Gait Training: 8-22 mins   PT G Codes:        Aida RaiderWendy Clarence Dunsmore, PT  Office # [phone] Pager [phone]   Ilda FoilGarrow, Jhonnie Aliano Rene 10/01/2017, 11:02 AM

## 2017-10-01 NOTE — Progress Notes (Signed)
Orthopedic Tech Progress Note Patient Details:  Crystal Acosta 06/19/1981 [id number] Brace completed by bio-tech. Patient ID: Crystal Acosta, female   DOB: 01/29/1981, 36 y.o.   MRN: [id number]   [name], Arline Ketter Craig 10/01/2017, 4:18 PM

## 2017-10-01 NOTE — Progress Notes (Signed)
Patient moved to 3[address] room 5. All questions answered. Report called to receiving RN. Belongins sent with patient and mother given directions to new room.

## 2017-10-01 NOTE — Progress Notes (Signed)
Subjective: Patient reports patient with condition of back pain  no new numbness or tingling anywhere  Objective: Vital signs in last 24 hours: Temp:  [97.7 F (36.5 C)-98.8 F (37.1 C)] 98.4 F (36.9 C) (10/19 0733) Pulse Rate:  [72-105] 72 (10/19 0733) Resp:  [11-28] 16 (10/19 0800) BP: (80-129)/(47-90) 81/53 (10/19 0733) SpO2:  [89 %-100 %] 94 % (10/19 0800) FiO2 (%):  [0 %] 0 % (10/19 0344) Weight:  [95.4 kg (210 lb 6.4 oz)] 95.4 kg (210 lb 6.4 oz) (10/18 1113)  Intake/Output from previous day: 10/18 0701 - 10/19 0700 In: 2200 [I.V.:2000; IV Piggyback:200] Out: 2170 [Urine:1775; Drains:245; Blood:150] Intake/Output this shift: No intake/output data recorded.  baseline left foot drop otherwise strength 5 out of 5  Lab Results: No results for input(s): WBC, HGB, HCT, PLT in the last 72 hours. BMET No results for input(s): NA, K, CL, CO2, GLUCOSE, BUN, CREATININE, CALCIUM in the last 72 hours.  Studies/Results: Dg Lumbar Spine 2-3 Views  Result Date: 09/30/2017 CLINICAL DATA:  Surgical posterior fusion of L4-5. EXAM: LUMBAR SPINE - 2-3 VIEW; DG C-ARM 61-120 MIN FLUOROSCOPY TIME:  33 seconds. COMPARISON:  MRI of August 31, 2017. FINDINGS: Two intraoperative fluoroscopic images of the lower lumbar spine were obtained. The patient is status post previous anterior fusion of L5-S1. The patient is now status post bilateral intrapedicular screw placement at L4 and L5 for posterior fusion. Interbody spacer is also noted. IMPRESSION: Status post bilateral intrapedicular screw placement at L4 and L5 for posterior fusion. Electronically Signed   By: Lupita RaiderJames  Green Jr, M.D.   On: 09/30/2017 16:38   Dg C-arm 61-120 Min  Result Date: 09/30/2017 CLINICAL DATA:  Surgical posterior fusion of L4-5. EXAM: LUMBAR SPINE - 2-3 VIEW; DG C-ARM 61-120 MIN FLUOROSCOPY TIME:  33 seconds. COMPARISON:  MRI of August 31, 2017. FINDINGS: Two intraoperative fluoroscopic images of the lower lumbar spine  were obtained. The patient is status post previous anterior fusion of L5-S1. The patient is now status post bilateral intrapedicular screw placement at L4 and L5 for posterior fusion. Interbody spacer is also noted. IMPRESSION: Status post bilateral intrapedicular screw placement at L4 and L5 for posterior fusion. Electronically Signed   By: Lupita RaiderJames  Green Jr, M.D.   On: 09/30/2017 16:38    Assessment/Plan: 36 year old female postop day 1 from a lumbar fusion L4-5. Significant pain management issues  Secondary to her long-standing care being provided and the pain management center.   patient came in on oral Suboxone. We'll stop the patient's Dilaudid PCA and well start the patient on oral Dilaudid and IV Dilaudid. Mobilizewith physical and occupational therapy  LOS: 1 day     Maleak Brazzel P 10/01/2017, 8:30 AM

## 2017-10-01 NOTE — Anesthesia Postprocedure Evaluation (Signed)
Anesthesia Post Note  Patient: Crystal SamsBrean D Berthelot  Procedure(s) Performed: Posterior Lumbar Interbody Fusion  - Lumbar four-five (N/A Back)     Patient location during evaluation: PACU Anesthesia Type: General Level of consciousness: awake and alert Pain management: pain level controlled Vital Signs Assessment: post-procedure vital signs reviewed and stable Respiratory status: spontaneous breathing, nonlabored ventilation, respiratory function stable and patient connected to nasal cannula oxygen Cardiovascular status: blood pressure returned to baseline and stable Postop Assessment: no apparent nausea or vomiting Anesthetic complications: no    Last Vitals:  Vitals:   10/01/17 1100 10/01/17 1712  BP: 102/63 (!) 97/54  Pulse: 76 82  Resp: 18 17  Temp: 37.1 C 37.7 C  SpO2: 99% 98%    Last Pain:  Vitals:   10/01/17 1712  TempSrc: Oral  PainSc:    Pain Goal: Patients Stated Pain Goal: 3 (09/30/17 1113)               Kennieth RadFitzgerald, Makilah Dowda E

## 2017-10-02 NOTE — Progress Notes (Signed)
Occupational Therapy Treatment Patient Details Name: Crystal Acosta MRN: [id number] DOB: Aug 02, 1981 Today's Date: 10/02/2017    History of present illness Pt is a 36 y.o. female s/p PLIF L4-5. PMH consists of PLIF x 2, discectomy L4-5, anxiety and depression.   OT comments  Pt progressing towards OT goals. Focus of session was AE education, Pt provided and educated on full AE kit. Pt then performed bed mobility, toilet transfer, peri care, sink level grooming, and returned to bed. Pt continues to benefit from skilled OT in the acute setting.   Follow Up Recommendations  Supervision/Assistance - 24 hour;No OT follow up (initially)    Equipment Recommendations  3 in 1 bedside commode    Recommendations for Other Services      Precautions / Restrictions Precautions Precautions: Back Precaution Comments: reviewed precautions with Pt Required Braces or Orthoses: Spinal Brace Spinal Brace: Lumbar corset Restrictions Weight Bearing Restrictions: No       Mobility Bed Mobility Overal bed mobility: Needs Assistance Bed Mobility: Sidelying to Sit;Rolling Rolling: Supervision Sidelying to sit: Supervision     Sit to sidelying: Min assist (for BLE back into bed) General bed mobility comments: increased time and effort, log roll technique performed without cueing, supervision for safety  Transfers Overall transfer level: Needs assistance Equipment used: Rolling walker (2 wheeled) Transfers: Sit to/from Stand Sit to Stand: Min guard         General transfer comment: increased time and effort, min guard for safety    Balance Overall balance assessment: Needs assistance Sitting-balance support: No upper extremity supported;Feet supported Sitting balance-Leahy Scale: Good     Standing balance support: Bilateral upper extremity supported;During functional activity Standing balance-Leahy Scale: Poor                             ADL either performed or assessed  with clinical judgement   ADL Overall ADL's : Needs assistance/impaired     Grooming: Supervision/safety;Standing;Wash/dry hands Grooming Details (indicate cue type and reason): sink level grooming                 Toilet Transfer: Min guard;Ambulation;Comfort height toilet;Grab bars;RW   Toileting- Water quality scientist and Hygiene: Min guard;Sit to/from stand Toileting - Clothing Manipulation Details (indicate cue type and reason): vc for safe hand placement rising from toilet       General ADL Comments: Pt fully educated in AE kit Medical illustrator, sock aide, long handle shoe horn, long hand sponge, toilet aide), demonstrated and Pt verbalized understanding     Vision       Perception     Praxis      Cognition Arousal/Alertness: Awake/alert Behavior During Therapy: WFL for tasks assessed/performed Overall Cognitive Status: Within Functional Limits for tasks assessed                                          Exercises     Shoulder Instructions       General Comments      Pertinent Vitals/ Pain       Pain Assessment: Faces Faces Pain Scale: Hurts little more Pain Location: back Pain Descriptors / Indicators: Sore Pain Intervention(s): Monitored during session;Repositioned  Home Living  Prior Functioning/Environment              Frequency  Min 2X/week        Progress Toward Goals  OT Goals(current goals can now be found in the care plan section)  Progress towards OT goals: Progressing toward goals  Acute Rehab OT Goals Patient Stated Goal: decrease pain OT Goal Formulation: With patient/family Time For Goal Achievement: 10/13/17 Potential to Achieve Goals: Good  Plan Discharge plan remains appropriate;Frequency remains appropriate    Co-evaluation                 AM-PAC PT "6 Clicks" Daily Activity     Outcome Measure   Help from another person  eating meals?: None Help from another person taking care of personal grooming?: None Help from another person toileting, which includes using toliet, bedpan, or urinal?: A Little Help from another person bathing (including washing, rinsing, drying)?: A Lot Help from another person to put on and taking off regular upper body clothing?: A Little Help from another person to put on and taking off regular lower body clothing?: A Lot 6 Click Score: 18    End of Session Equipment Utilized During Treatment: Gait belt;Rolling walker;Other (comment) (AE kit)  OT Visit Diagnosis: Unsteadiness on feet (R26.81);Other abnormalities of gait and mobility (R26.89);Pain Pain - part of body:  (back)   Activity Tolerance Patient tolerated treatment well   Patient Left in bed;with call bell/phone within reach;with bed alarm set   Nurse Communication Mobility status;Precautions        Time: 8675-1982 OT Time Calculation (min): 13 min  Charges: OT General Charges $OT Visit: 1 Visit OT Treatments $Self Care/Home Management : 8-22 mins  Hulda Humphrey OTR/L [place] 10/02/2017, 5:31 PM

## 2017-10-02 NOTE — Progress Notes (Signed)
Physical Therapy Treatment Patient Details Name: Crystal SamsBrean D Acosta MRN: [id number] DOB: 03/25/1981 Today's Date: 10/02/2017    History of Present Illness Pt is a 36 y.o. female s/p PLIF L4-5. PMH consists of PLIF x 2, discectomy L4-5, anxiety and depression.    PT Comments    Pt seen for mobility progression and making steady progress. PT will continue to follow pt acutely to ensure a safe d/c home.    Follow Up Recommendations  Supervision/Assistance - 24 hour;No PT follow up     Equipment Recommendations  Rolling walker with 5" wheels    Recommendations for Other Services       Precautions / Restrictions Precautions Precautions: Back Required Braces or Orthoses: Spinal Brace Spinal Brace: Lumbar corset Restrictions Weight Bearing Restrictions: No    Mobility  Bed Mobility Overal bed mobility: Needs Assistance Bed Mobility: Sidelying to Sit   Sidelying to sit: Supervision       General bed mobility comments: increased time and effort, log roll technique performed without cueing, supervision for safety  Transfers Overall transfer level: Needs assistance Equipment used: Rolling walker (2 wheeled) Transfers: Sit to/from Stand Sit to Stand: Min guard         General transfer comment: increased time and effort, min guard for safety  Ambulation/Gait Ambulation/Gait assistance: Min guard Ambulation Distance (Feet): 100 Feet Assistive device: Rolling walker (2 wheeled) Gait Pattern/deviations: Step-through pattern;Decreased stride length Gait velocity: decreased Gait velocity interpretation: Below normal speed for age/gender General Gait Details: verbal cues to remind pt to breath throughout as she gets very tense with ambulation. No instability or LOB, min guard for safety   Stairs            Wheelchair Mobility    Modified Rankin (Stroke Patients Only)       Balance Overall balance assessment: Needs assistance Sitting-balance support: No upper  extremity supported;Feet supported Sitting balance-Leahy Scale: Good     Standing balance support: Bilateral upper extremity supported;During functional activity Standing balance-Leahy Scale: Poor                              Cognition Arousal/Alertness: Awake/alert Behavior During Therapy: WFL for tasks assessed/performed Overall Cognitive Status: Within Functional Limits for tasks assessed                                        Exercises      General Comments        Pertinent Vitals/Pain Pain Assessment: Faces Faces Pain Scale: Hurts little more Pain Location: back Pain Descriptors / Indicators: Sore Pain Intervention(s): Monitored during session;Repositioned    Home Living                      Prior Function            PT Goals (current goals can now be found in the care plan section) Acute Rehab PT Goals PT Goal Formulation: With patient Time For Goal Achievement: 10/08/17 Potential to Achieve Goals: Good Progress towards PT goals: Progressing toward goals    Frequency    Min 5X/week      PT Plan Current plan remains appropriate    Co-evaluation              AM-PAC PT "6 Clicks" Daily Activity  Outcome Measure  Difficulty turning over in  bed (including adjusting bedclothes, sheets and blankets)?: A Little Difficulty moving from lying on back to sitting on the side of the bed? : A Lot Difficulty sitting down on and standing up from a chair with arms (e.g., wheelchair, bedside commode, etc,.)?: Unable Help needed moving to and from a bed to chair (including a wheelchair)?: A Little Help needed walking in hospital room?: A Little Help needed climbing 3-5 steps with a railing? : A Little 6 Click Score: 15    End of Session Equipment Utilized During Treatment: Back brace Activity Tolerance: Patient limited by pain Patient left: in chair;with call bell/phone within reach Nurse Communication: Mobility  status;Precautions PT Visit Diagnosis: Pain;Other abnormalities of gait and mobility (R26.89) Pain - part of body:  (back)     Time: 1610-9604 PT Time Calculation (min) (ACUTE ONLY): 19 min  Charges:  $Gait Training: 8-22 mins                    G Codes:       [place], [place], [place] [phone]    Alessandra Bevels Casmer Yepiz 10/02/2017, 3:27 PM

## 2017-10-02 NOTE — Progress Notes (Signed)
Subjective: Patient reports still quite sore  Objective: Vital signs in last 24 hours: Temp:  [98.4 F (36.9 C)-99.9 F (37.7 C)] 99.9 F (37.7 C) (10/20 0656) Pulse Rate:  [72-91] 86 (10/20 0656) Resp:  [16-18] 18 (10/20 0656) BP: (80-118)/(47-75) 118/75 (10/20 0656) SpO2:  [92 %-99 %] 98 % (10/20 0656)  Intake/Output from previous day: 10/19 0730 - 10/20 0729 In: 950 [P.O.:840] Out: 2100 [Urine:1750] Intake/Output this shift: Total I/O In: 950 [P.O.:840; Other:110] Out: 2100 [Urine:1750; Other:350]  Physical Exam: Dressing CDI.  Strength full.  Lab Results: No results for input(s): WBC, HGB, HCT, PLT in the last 72 hours. BMET No results for input(s): NA, K, CL, CO2, GLUCOSE, BUN, CREATININE, CALCIUM in the last 72 hours.  Studies/Results: Dg Lumbar Spine 2-3 Views  Result Date: 09/30/2017 CLINICAL DATA:  Surgical posterior fusion of L4-5. EXAM: LUMBAR SPINE - 2-3 VIEW; DG C-ARM 61-120 MIN FLUOROSCOPY TIME:  33 seconds. COMPARISON:  MRI of August 31, 2017. FINDINGS: Two intraoperative fluoroscopic images of the lower lumbar spine were obtained. The patient is status post previous anterior fusion of L5-S1. The patient is now status post bilateral intrapedicular screw placement at L4 and L5 for posterior fusion. Interbody spacer is also noted. IMPRESSION: Status post bilateral intrapedicular screw placement at L4 and L5 for posterior fusion. Electronically Signed   By: Lupita RaiderJames  Green Jr, M.D.   On: 09/30/2017 16:38   Dg C-arm 61-120 Min  Result Date: 09/30/2017 CLINICAL DATA:  Surgical posterior fusion of L4-5. EXAM: LUMBAR SPINE - 2-3 VIEW; DG C-ARM 61-120 MIN FLUOROSCOPY TIME:  33 seconds. COMPARISON:  MRI of August 31, 2017. FINDINGS: Two intraoperative fluoroscopic images of the lower lumbar spine were obtained. The patient is status post previous anterior fusion of L5-S1. The patient is now status post bilateral intrapedicular screw placement at L4 and L5 for  posterior fusion. Interbody spacer is also noted. IMPRESSION: Status post bilateral intrapedicular screw placement at L4 and L5 for posterior fusion. Electronically Signed   By: Lupita RaiderJames  Green Jr, M.D.   On: 09/30/2017 16:38    Assessment/Plan: Mobilize with PT today.  Doing well.     LOS: 2 days    Dorian HeckleSTERN,Azuree Minish D, MD 10/02/2017, 7:25 AM

## 2017-10-02 NOTE — Progress Notes (Signed)
Patient walked 100 feet in the hall with PT.

## 2017-10-03 MED ORDER — INFLUENZA VAC SPLIT QUAD 0.5 ML IM SUSY
0.5000 mL | PREFILLED_SYRINGE | INTRAMUSCULAR | Status: AC
  Administered 2017-10-04: 0.5 mL via INTRAMUSCULAR
  Filled 2017-10-03: qty 0.5

## 2017-10-03 MED ORDER — DIAZEPAM 5 MG PO TABS
5.0000 mg | ORAL_TABLET | Freq: Four times a day (QID) | ORAL | Status: DC | PRN
  Administered 2017-10-03 – 2017-10-04 (×2): 5 mg via ORAL
  Filled 2017-10-03 (×2): qty 1

## 2017-10-03 MED ORDER — PANTOPRAZOLE SODIUM 40 MG PO TBEC
40.0000 mg | DELAYED_RELEASE_TABLET | Freq: Every day | ORAL | Status: DC
  Administered 2017-10-03: 40 mg via ORAL
  Filled 2017-10-03: qty 1

## 2017-10-03 NOTE — Progress Notes (Signed)
Patient ID: Crystal Acosta, female   DOB: 02/11/1981, 36 y.o.   MRN: [id number] BP 105/60 (BP Location: Left Arm)   Pulse 82   Temp 98.7 F (37.1 C) (Oral)   Resp 20   Ht 5\' 6"  (1.676 m)   Wt 95.4 kg (210 lb 6.4 oz)   SpO2 95%   BMI 33.96 kg/m  Alert and oriented x 4, speech is clear and fluent Wound is clean,dry, no signs of infection Foot drop left side, moving all else normally

## 2017-10-03 NOTE — Progress Notes (Signed)
Physical Therapy Treatment Patient Details Name: Crystal Acosta MRN: [id number] DOB: 15-Jan-1981 Today's Date: 10/03/2017    History of Present Illness Pt is a 36 y.o. female s/p PLIF L4-5. PMH consists of PLIF x 2, discectomy L4-5, anxiety and depression.    PT Comments    Patient progressing with mobility tolerated increased distance and less assist. Current POC remains appropriate.   Follow Up Recommendations  Supervision/Assistance - 24 hour;No PT follow up     Equipment Recommendations  Rolling walker with 5" wheels    Recommendations for Other Services       Precautions / Restrictions Precautions Precautions: Back Precaution Comments: reviewed precautions with Pt Required Braces or Orthoses: Spinal Brace Spinal Brace: Lumbar corset Restrictions Weight Bearing Restrictions: No    Mobility  Bed Mobility Overal bed mobility: Needs Assistance Bed Mobility: Rolling;Sit to Sidelying Rolling: Supervision       Sit to sidelying: Min assist (for BLE back into bed) General bed mobility comments: increased time and effort, log roll technique performed without cueing, supervision for safety  Transfers Overall transfer level: Needs assistance Equipment used: Rolling walker (2 wheeled) Transfers: Sit to/from Stand Sit to Stand: Supervision         General transfer comment: Increased time, Vcs for hand placement, no physical assist required  Ambulation/Gait Ambulation/Gait assistance: Supervision Ambulation Distance (Feet): 210 Feet Assistive device: Rolling walker (2 wheeled) Gait Pattern/deviations: Step-through pattern;Decreased stride length Gait velocity: decreased   General Gait Details: verbal cues to remind pt to breath throughout as she gets very tense with ambulation. No instability or LOB, min guard for safety   Stairs            Wheelchair Mobility    Modified Rankin (Stroke Patients Only)       Balance Overall balance assessment:  Needs assistance Sitting-balance support: No upper extremity supported;Feet supported Sitting balance-Leahy Scale: Good     Standing balance support: Bilateral upper extremity supported;During functional activity Standing balance-Leahy Scale: Poor Standing balance comment: reliance on RW for support                            Cognition Arousal/Alertness: Awake/alert Behavior During Therapy: WFL for tasks assessed/performed Overall Cognitive Status: Within Functional Limits for tasks assessed                                        Exercises      General Comments        Pertinent Vitals/Pain Pain Assessment: 0-10 Pain Score: 6  Pain Location: back Pain Descriptors / Indicators: Sore Pain Intervention(s): Monitored during session    Home Living                      Prior Function            PT Goals (current goals can now be found in the care plan section) Acute Rehab PT Goals Patient Stated Goal: decrease pain PT Goal Formulation: With patient Time For Goal Achievement: 10/08/17 Potential to Achieve Goals: Good Progress towards PT goals: Progressing toward goals    Frequency    Min 5X/week      PT Plan Current plan remains appropriate    Co-evaluation              AM-PAC PT "6 Clicks" Daily Activity  Outcome  Measure  Difficulty turning over in bed (including adjusting bedclothes, sheets and blankets)?: A Little Difficulty moving from lying on back to sitting on the side of the bed? : A Little Difficulty sitting down on and standing up from a chair with arms (e.g., wheelchair, bedside commode, etc,.)?: A Little Help needed moving to and from a bed to chair (including a wheelchair)?: A Little Help needed walking in hospital room?: A Little Help needed climbing 3-5 steps with a railing? : A Little 6 Click Score: 18    End of Session Equipment Utilized During Treatment: Back brace Activity Tolerance: Patient  limited by pain Patient left: in bed;with call bell/phone within reach Nurse Communication: Mobility status;Precautions PT Visit Diagnosis: Pain;Other abnormalities of gait and mobility (R26.89) Pain - part of body:  (back)     Time: [id number] PT Time Calculation (min) (ACUTE ONLY): 13 min  Charges:  $Gait Training: 8-22 mins                    G Codes:       Charlotte Crumbevon Patrica Mendell, PT DPT  Board Certified Neurologic Specialist [phone]    Fabio AsaDevon J Joshiah Traynham 10/03/2017, 11:23 AM

## 2017-10-03 NOTE — Progress Notes (Addendum)
Hemovac drain pulled out by pt while in the bathroom. Honeycomb dressing had minimal bloody drainage. No c/o of pain at the site, or redness, warmth, swelling. Will continue to monitor. No drainage in hemovac.

## 2017-10-04 MED ORDER — HYDROMORPHONE HCL 4 MG PO TABS: 4.0000 mg | ORAL_TABLET | ORAL | 0 refills | Status: DC | PRN

## 2017-10-04 MED ORDER — BISACODYL 10 MG RE SUPP: 10.0000 mg | Freq: Every day | RECTAL | Status: DC | PRN

## 2017-10-04 NOTE — Discharge Summary (Signed)
  Physician Discharge Summary  Patient ID: Crystal Acosta MRN: [id number] DOB/AGE: 36/12/1980 36 y.o.  Admit date: 09/30/2017 Discharge date: 10/04/2017  Admission Diagnoses:herniated nucleus pulposus and instability L4-5  Discharge Diagnoses: same Active Problems:   HNP (herniated nucleus pulposus), lumbar   Discharged Condition: good  Hospital Course: patient is admitted hoserwent decompressive laminectomy and fusion. Postoperatively patient did very well recovered in the floor initially had a lot of difficulties with pain management so had to stay in the ICU on the first night but then was able to transition to the floor and pain became under much better control throughout the weekend. Patient will be discharged today on oral Dilaudid for pain with follow-up with her pain management doctor.  Consults: Significant Diagnostic Studies: Treatments:decompression fusion L4-5 Discharge Exam: Blood pressure 107/64, pulse 83, temperature 99.4 F (37.4 C), temperature source Oral, resp. rate 18, height 5\' 6"  (1.676 m), weight 95.4 kg (210 lb 6.4 oz), SpO2 97 %. Strength out of 5 wound clean dry and intact with a baseline left foot drop  Disposition: ome   Allergies as of 10/04/2017      Reactions   Questran [cholestyramine] Hives      Medication List    STOP taking these medications   ibuprofen 200 MG tablet Commonly known as:  ADVIL,MOTRIN   oxyCODONE-acetaminophen 5-325 MG tablet Commonly known as:  PERCOCET/ROXICET   SUBOXONE 8-2 MG Film Generic drug:  Buprenorphine HCl-Naloxone HCl     TAKE these medications   acetaminophen 325 MG tablet Commonly known as:  TYLENOL Take 650 mg by mouth every 6 (six) hours as needed for moderate pain or headache.   cyclobenzaprine 10 MG tablet Commonly known as:  FLEXERIL Take 1 tablet (10 mg total) by mouth 3 (three) times daily as needed for muscle spasms.   HYDROmorphone 4 MG tablet Commonly known as:  DILAUDID Take 1 tablet  (4 mg total) by mouth every 3 (three) hours as needed for severe pain.            Durable Medical Equipment        Start     Ordered   10/04/17 1040  For home use only DME 3 n 1  Once     10/04/17 1040   10/01/17 1454  For home use only DME Walker rolling  Once    Question:  Patient needs a walker to treat with the following condition  Answer:  Weakness   10/01/17 1453     Follow-up Information    [facility] - Dme Follow up.   Why:  rep will bring rolling walker to patient room before discharge Contact information: [address] [place] [id number] [phone]           Signed: Mariam DollarCRAM,Bianca Raneri P 10/04/2017, 12:27 PM

## 2017-10-04 NOTE — Progress Notes (Signed)
Physical Therapy Treatment Patient Details Name: Crystal Acosta MRN: [id number] DOB: Feb 07, 1981 Today's Date: 10/04/2017    History of Present Illness Pt is a 36 y.o. female s/p PLIF L4-5. PMH consists of PLIF x 2, discectomy L4-5, anxiety and depression.    PT Comments    Patient able to tolerate increased mobility activities without use of assistive device today. She is progressing toward mobility goals. Current POC remains appropriate.   Follow Up Recommendations  Supervision/Assistance - 24 hour;No PT follow up     Equipment Recommendations  Rolling walker with 5" wheels    Recommendations for Other Services       Precautions / Restrictions Precautions Precautions: Back Precaution Booklet Issued: Yes (comment) Precaution Comments: reviewed precautions with Pt Required Braces or Orthoses: Spinal Brace Spinal Brace: Lumbar corset;Applied in sitting position Restrictions Weight Bearing Restrictions: No    Mobility  Bed Mobility Overal bed mobility: Needs Assistance Bed Mobility: Rolling;Sit to Sidelying Rolling: Modified independent (Device/Increase time) Sidelying to sit: Modified independent (Device/Increase time)       General bed mobility comments: increased time and effort, needing use of bed rails; log roll technique performed without cueing  Transfers Overall transfer level: Needs assistance Equipment used: None Transfers: Sit to/from Stand Sit to Stand: Supervision         General transfer comment: no physical assist required; supervision for safety as patient wanted to try without using RW today  Ambulation/Gait Ambulation/Gait assistance: Modified independent (Device/Increase time);Supervision Ambulation Distance (Feet): 500 Feet Assistive device: None Gait Pattern/deviations: Step-through pattern;Decreased stride length;Drifts right/left Gait velocity: decreased Gait velocity interpretation: Below normal speed for age/gender General Gait  Details: verbal cues for pacing and to remind pt to breath throughout. No significant LOB or instability   Stairs Stairs: Yes   Stair Management: One rail Right;Two rails;Step to pattern Number of Stairs: 4 General stair comments: slow to complete; supervision for safety  Wheelchair Mobility    Modified Rankin (Stroke Patients Only)       Balance Overall balance assessment: Needs assistance Sitting-balance support: Feet supported;No upper extremity supported Sitting balance-Leahy Scale: Good     Standing balance support: During functional activity;No upper extremity supported Standing balance-Leahy Scale: Good                              Cognition Arousal/Alertness: Awake/alert Behavior During Therapy: WFL for tasks assessed/performed Overall Cognitive Status: Within Functional Limits for tasks assessed                                        Exercises      General Comments        Pertinent Vitals/Pain Pain Assessment: Faces Faces Pain Scale: Hurts little more Pain Location: back Pain Descriptors / Indicators: Sore Pain Intervention(s): Monitored during session    Home Living                      Prior Function            PT Goals (current goals can now be found in the care plan section) Acute Rehab PT Goals Patient Stated Goal: decrease pain PT Goal Formulation: With patient Time For Goal Achievement: 10/08/17 Potential to Achieve Goals: Good Progress towards PT goals: Progressing toward goals    Frequency    Min 5X/week  PT Plan Current plan remains appropriate    Co-evaluation              AM-PAC PT "6 Clicks" Daily Activity  Outcome Measure  Difficulty turning over in bed (including adjusting bedclothes, sheets and blankets)?: A Little Difficulty moving from lying on back to sitting on the side of the bed? : A Little Difficulty sitting down on and standing up from a chair with arms (e.g.,  wheelchair, bedside commode, etc,.)?: A Little Help needed moving to and from a bed to chair (including a wheelchair)?: A Little Help needed walking in hospital room?: A Little Help needed climbing 3-5 steps with a railing? : A Little 6 Click Score: 18    End of Session Equipment Utilized During Treatment: Gait belt;Back brace Activity Tolerance: Patient tolerated treatment well Patient left: in chair;with call bell/phone within reach Nurse Communication: Mobility status PT Visit Diagnosis: Pain;Other abnormalities of gait and mobility (R26.89) Pain - part of body:  (back)     Time: [id number] PT Time Calculation (min) (ACUTE ONLY): 15 min  Charges:  $Gait Training: 8-22 mins                    G CodesMckinley Acosta:       Crystal Acosta, SPT [phone] office    Crystal Birkenheadndria J  Hannahgrace Acosta 10/04/2017, 9:40 AM

## 2017-10-04 NOTE — Progress Notes (Signed)
Pharmacy contacted about fluctuating temp of 98-99 and flu shot was still able to be given per pharmacy.

## 2017-10-04 NOTE — Progress Notes (Signed)
Adora FridgeBrean Mancini to be D/C'd Home per MD. Discussed with the patient and all questions fully answered.   VSS, skin clean, dry and intact without evidence of skin break down, no evidence of skin tears noted.  IV catheters discontinued intact. Site without signs and symptoms of complications. Dressing and pressure applied.   An after visit summary was printed and given to the patient. Patient received prescriptions.   D/c education completed with patient/family including follow up instructions, medications list, d/c activities limitations if indicated, with other d/c instructions as indicated by MD- patient able to verbalize understanding, all questions fully answered.   Patient instructed to return to ED, call 911, or Call MD for any changes in condition.  [facility] home via private auto at 1520.

## 2017-10-04 NOTE — Progress Notes (Signed)
Patient discharged home. Discharge instructions were reviewed with patient. Patient verbalized understanding.  

## 2017-10-04 NOTE — Discharge Instructions (Signed)
No lifting no bending no twisting no dring no riding come back and forth to see me.

## 2017-10-04 NOTE — Care Management Note (Signed)
Case Management Note  Patient Details  Name: Crystal Acosta MRN: [id number] Date of Birth: 02/11/1981  Subjective/Objective:                    Action/Plan: Pt with orders for walker and 3 in 1. CM notified Brad with [facility] and he will deliver the equipment to the room.  Pt has transportation home when ready.  Expected Discharge Date:                  Expected Discharge Plan:  Home/Self Care  In-House Referral:     Discharge planning Services  CM Consult  Post Acute Care Choice:    Choice offered to:     DME Arranged:  Walker rolling, 3-N-1 DME Agency:  [facility]  HH Arranged:    HH Agency:     Status of Service:  Completed, signed off  If discussed at Long Length of Stay Meetings, dates discussed:    Additional Comments:  Kermit BaloKelli F Morgan Rennert, RN 10/04/2017, 11:00 AM

## 2018-12-31 IMAGING — MR MR LUMBAR SPINE W/O CM
5 series · 46 of 48 positions shown · non-contrast
Comparison: 05/30/2014

CLINICAL DATA: Left low back pain.  Left foot numbness for 5 days.

EXAM:
MRI LUMBAR SPINE WITHOUT CONTRAST
TECHNIQUE: Multiplanar, multisequence MR imaging of the lumbar spine was
performed. No intravenous contrast was administered.

[Series 3: tirm sag · sagittal · 4.0mm · 0.55mm/px · 6 of 13 slices shown]
[im 1/13]
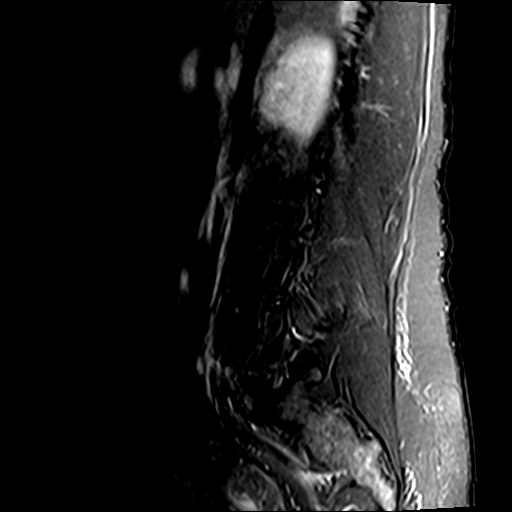
[im 3/13]
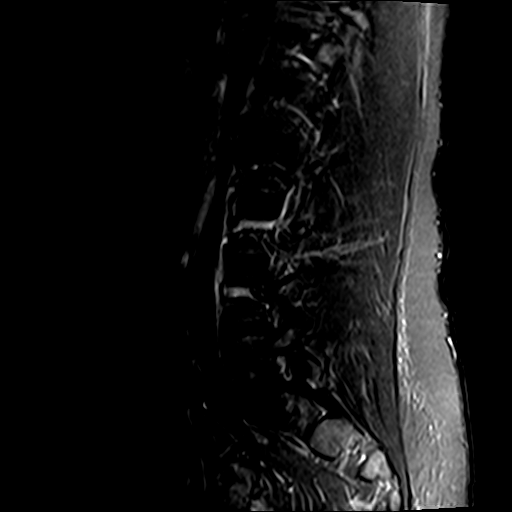
[im 5/13]
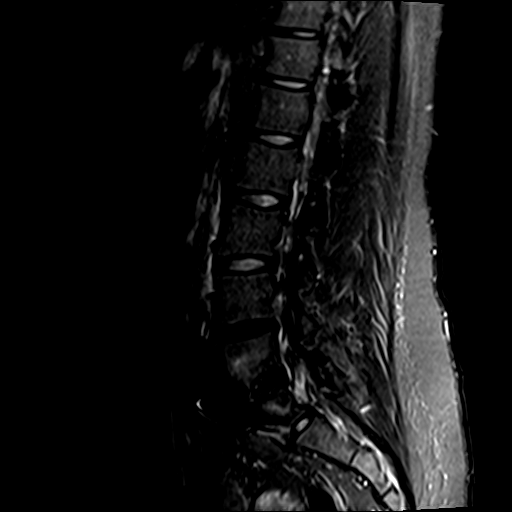
[im 8/13]
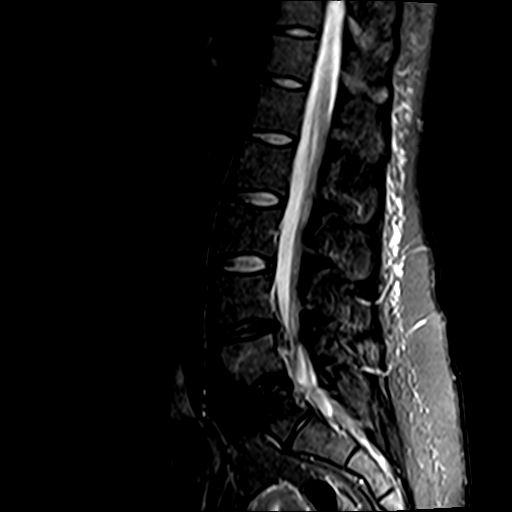
[im 10/13]
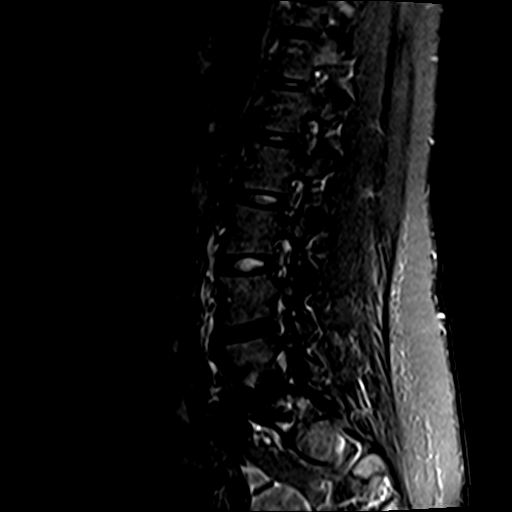
[im 13/13]
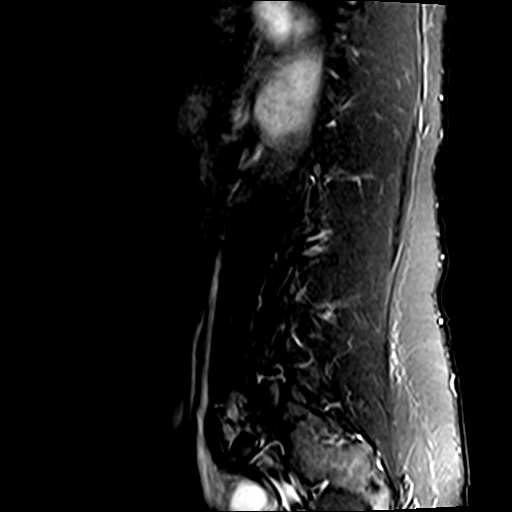

[Series 4: T2 · sagittal · 4.0mm · 0.88mm/px · 6 of 13 slices shown (1 of 2)]
[im 1/13]
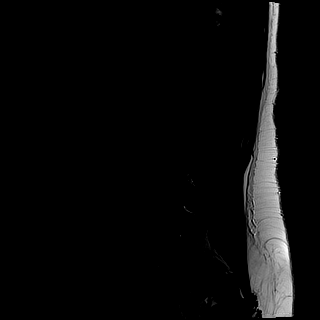
[im 3/13]
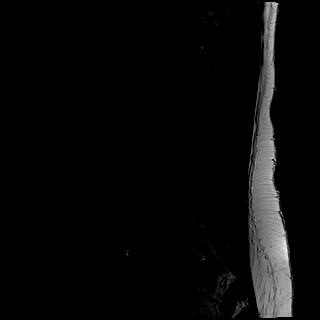
[im 5/13]
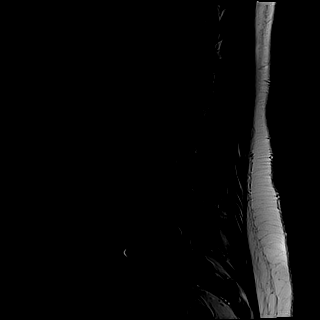
[im 8/13]
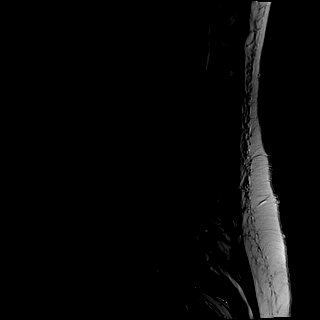
[im 10/13]
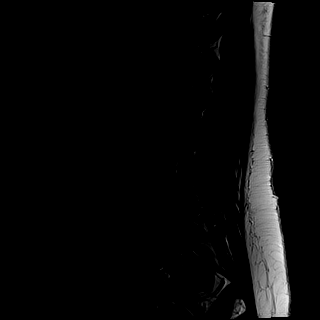
[im 13/13]
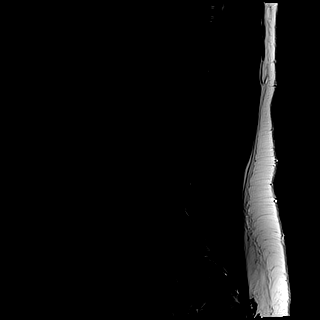

[Series 5: T1 · sagittal · 4.0mm · 0.88mm/px · 6 of 13 slices shown (1 of 2)]
[im 1/13]
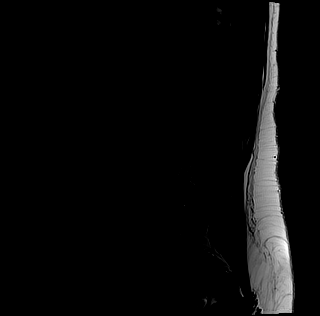
[im 3/13]
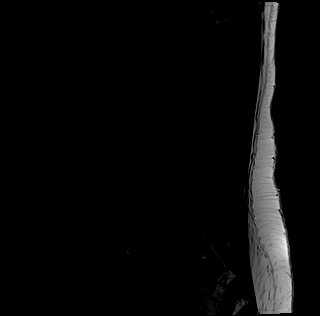
[im 5/13]
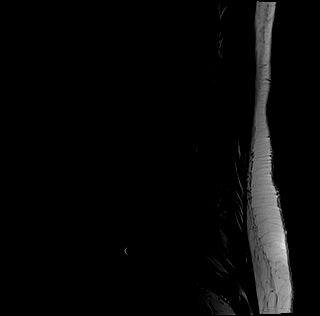
[im 8/13]
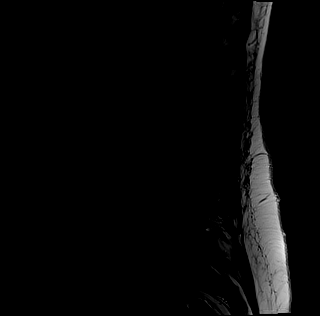
[im 10/13]
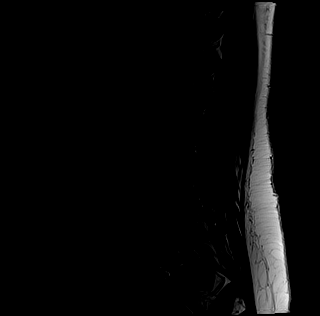
[im 13/13]
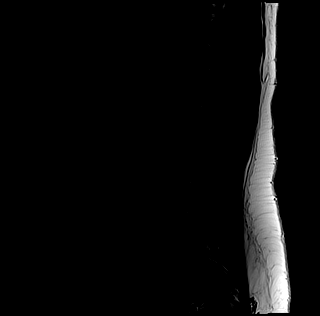

[Series 6: T1 · axial · 4.0mm · 0.78mm/px · z∈[-112,+76]mm · 13 of 33 slices shown (2 of 2)]
[im 1/33]
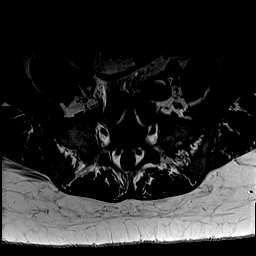
[im 3/33]
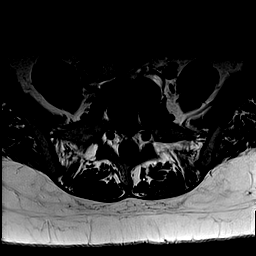
[im 5/33]
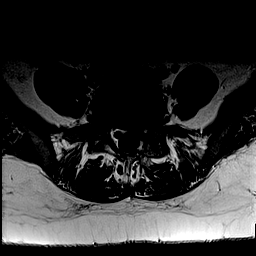
[im 7/33]
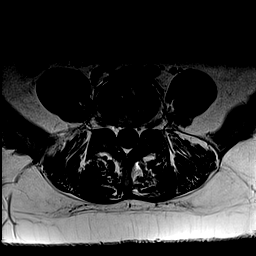
[im 10/33]
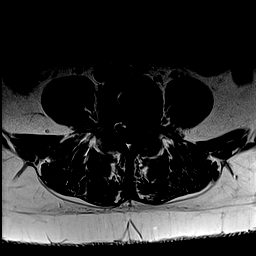
[im 12/33]
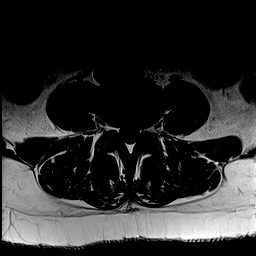
[im 14/33]
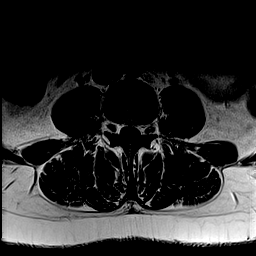
[im 17/33]
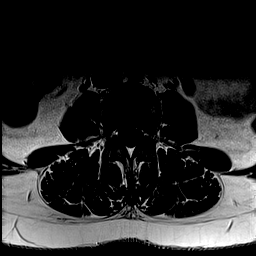
[im 19/33]
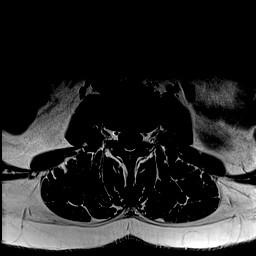
[im 21/33]
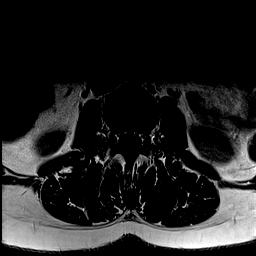
[im 23/33]
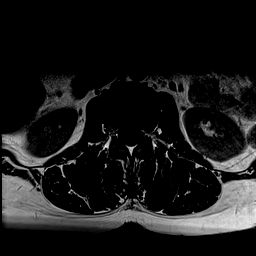
[im 28/33]
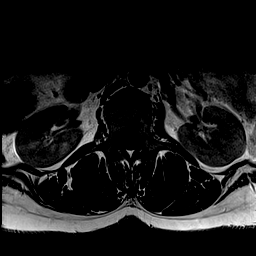
[im 33/33]
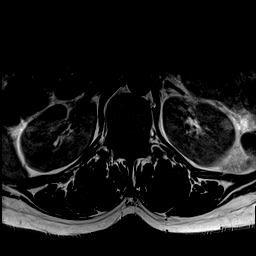

[Series 7: T2 · axial · 4.0mm · 0.78mm/px · z∈[-112,+76]mm · 15 of 33 slices shown (2 of 2)]
[im 1/33]
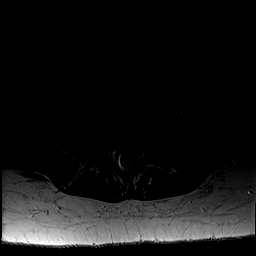
[im 3/33]
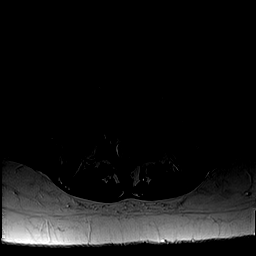
[im 5/33]
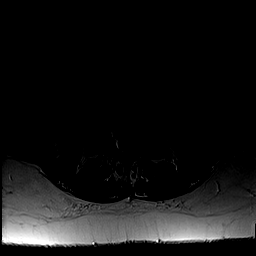
[im 7/33]
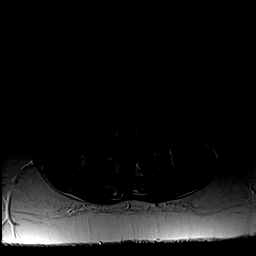
[im 10/33]
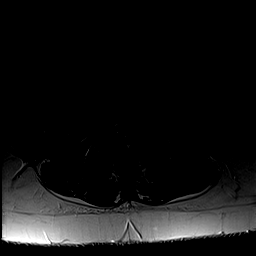
[im 12/33]
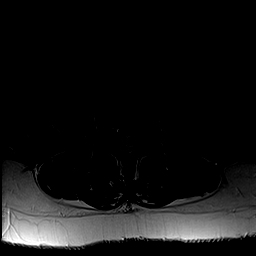
[im 14/33]
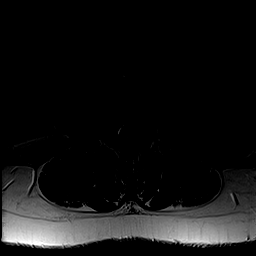
[im 17/33]
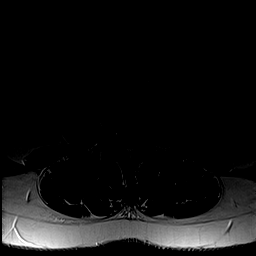
[im 19/33]
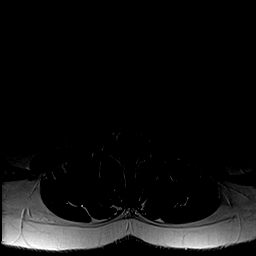
[im 21/33]
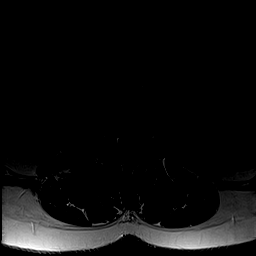
[im 23/33]
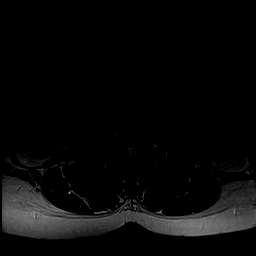
[im 26/33]
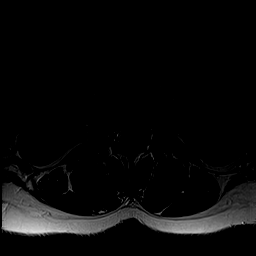
[im 28/33]
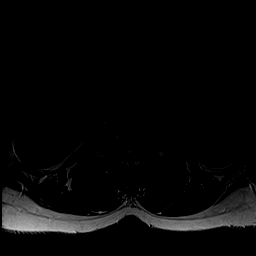
[im 30/33]
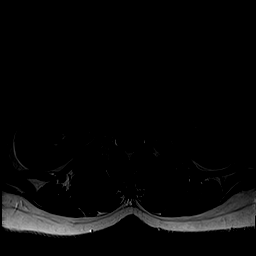
[im 33/33]
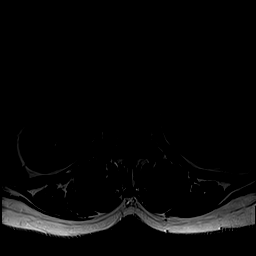

[46 of 48 positions shown; findings below may reference images not displayed]

FINDINGS: Segmentation:  Standard.

Alignment:  Physiologic.

Vertebrae:  No fracture, evidence of discitis, or bone lesion.

Conus medullaris: Extends to the L1-2 disc level and appears normal.

Paraspinal and other soft tissues: Negative

Disc levels:

T12- L1: Unremarkable.

L1-L2: Unremarkable.

L2-L3: Unremarkable.

L3-L4: Unremarkable.

L4-L5: Disc desiccation and bulging with left paracentral extrusion
that extends inferiorly to the mid L5 body level. Broad impingement
on the left L5 nerve.

L5-S1:ALIF.  No residual impingement.
IMPRESSION: 1. L4-5 left paracentral disc extrusion which migrates inferiorly.
Left L5 compression in the subarticular recess.
2. L5-0RMHEK.  No residual impingement.

## 2023-07-01 ENCOUNTER — Other Ambulatory Visit: Payer: Self-pay

## 2023-07-01 ENCOUNTER — Observation Stay (HOSPITAL_COMMUNITY)
Admission: EM | Admit: 2023-07-01 | Discharge: 2023-07-04 | Disposition: A | Discharge status: Home / Self Care | Payer: Medicaid Other | Attending: Internal Medicine | Admitting: Internal Medicine

## 2023-07-01 ENCOUNTER — Encounter (HOSPITAL_COMMUNITY): Payer: Self-pay

## 2023-07-01 DIAGNOSIS — K5900 Constipation, unspecified: Secondary | ICD-10-CM | POA: Insufficient documentation

## 2023-07-01 DIAGNOSIS — Z1152 Encounter for screening for COVID-19: Secondary | ICD-10-CM | POA: Diagnosis not present

## 2023-07-01 DIAGNOSIS — Z79899 Other long term (current) drug therapy: Secondary | ICD-10-CM | POA: Insufficient documentation

## 2023-07-01 DIAGNOSIS — M549 Dorsalgia, unspecified: Secondary | ICD-10-CM | POA: Insufficient documentation

## 2023-07-01 DIAGNOSIS — E876 Hypokalemia: Secondary | ICD-10-CM | POA: Insufficient documentation

## 2023-07-01 DIAGNOSIS — G8929 Other chronic pain: Secondary | ICD-10-CM | POA: Diagnosis not present

## 2023-07-01 DIAGNOSIS — T424X1A Poisoning by benzodiazepines, accidental (unintentional), initial encounter: Secondary | ICD-10-CM | POA: Diagnosis not present

## 2023-07-01 DIAGNOSIS — T40601A Poisoning by unspecified narcotics, accidental (unintentional), initial encounter: Secondary | ICD-10-CM

## 2023-07-01 DIAGNOSIS — T50901A Poisoning by unspecified drugs, medicaments and biological substances, accidental (unintentional), initial encounter: Principal | ICD-10-CM

## 2023-07-01 LAB — COMPREHENSIVE METABOLIC PANEL WITH GFR
ALT: 13 U/L (ref 0–44)
AST: 16 U/L (ref 15–41)
Albumin: 3.4 g/dL — ABNORMAL LOW (ref 3.5–5.0)
Alkaline Phosphatase: 65 U/L (ref 38–126)
Anion gap: 15 (ref 5–15)
BUN: 8 mg/dL (ref 6–20)
CO2: 26 mmol/L (ref 22–32)
Calcium: 8.9 mg/dL (ref 8.9–10.3)
Chloride: 99 mmol/L (ref 98–111)
Creatinine, Ser: 0.65 mg/dL (ref 0.44–1.00)
GFR, Estimated: >60 mL/min
Glucose, Bld: 106 mg/dL — ABNORMAL HIGH (ref 70–99)
Potassium: 3.7 mmol/L (ref 3.5–5.1)
Sodium: 140 mmol/L (ref 135–145)
Total Bilirubin: 0.2 mg/dL — ABNORMAL LOW (ref 0.3–1.2)
Total Protein: 6.5 g/dL (ref 6.5–8.1)

## 2023-07-01 LAB — CBC WITH DIFFERENTIAL/PLATELET
Abs Immature Granulocytes: 0.04 10*3/uL (ref 0.00–0.07)
Basophils Absolute: 0.1 10*3/uL (ref 0.0–0.1)
Basophils Relative: 1 %
Eosinophils Absolute: 0.8 10*3/uL — ABNORMAL HIGH (ref 0.0–0.5)
Eosinophils Relative: 7 %
HCT: 34.6 % — ABNORMAL LOW (ref 36.0–46.0)
Hemoglobin: 11.2 g/dL — ABNORMAL LOW (ref 12.0–15.0)
Immature Granulocytes: 0 %
Lymphocytes Relative: 23 %
Lymphs Abs: 2.9 10*3/uL (ref 0.7–4.0)
MCH: 28.4 pg (ref 26.0–34.0)
MCHC: 32.4 g/dL (ref 30.0–36.0)
MCV: 87.8 fL (ref 80.0–100.0)
Monocytes Absolute: 0.9 10*3/uL (ref 0.1–1.0)
Monocytes Relative: 7 %
Neutro Abs: 7.5 10*3/uL (ref 1.7–7.7)
Neutrophils Relative %: 62 %
Platelets: 431 10*3/uL — ABNORMAL HIGH (ref 150–400)
RBC: 3.94 MIL/uL (ref 3.87–5.11)
RDW: 13.6 % (ref 11.5–15.5)
WBC: 12.2 10*3/uL — ABNORMAL HIGH (ref 4.0–10.5)
nRBC: 0 % (ref 0.0–0.2)

## 2023-07-01 MED ORDER — LACTATED RINGERS IV BOLUS
1000.0000 mL | Freq: Once | INTRAVENOUS | Status: AC
  Administered 2023-07-01: 1000 mL via INTRAVENOUS

## 2023-07-01 NOTE — ED Notes (Signed)
Notified Dr. Earlene Plater that patient's Spo2 dropped to 85% on RA and placed on 3 L of O2 per [place] patient also presenting more sleepy since she first arrived and concerned for need for more narcan however no orders placed at this time.

## 2023-07-01 NOTE — ED Provider Notes (Signed)
[place] EMERGENCY DEPARTMENT AT [facility] Provider Note   CSN: [id number] Arrival date & time: 07/01/23  2043     History  Chief Complaint  Patient presents with   Drug Overdose    Crystal Acosta is a 42 y.o. female.   Drug Overdose  42 year old female presenting for accidental overdose.  Patient states she was meeting with a friend today in her friend's car.  Her friend gave her a pill which was reported to be Xanax.  The patient and the friend both took the pill.  The friend stopped breathing became cyanotic.  The patient did CPR on her friend who was taken to the hospital.  The patient then remembers waking up in the ambulance after receiving Narcan.  She denies any intent for self-harm.  She states she has not used recently and is otherwise been sober.  No SI or HI.  She has no symptoms now, no shortness of breath, chest pain, difficulty breathing.  No recent fevers or chills.  She is regretful for taking the medication.  Per EMS report she was found with low respiratory rate and some cyanosis.  This improved after intramuscular Narcan.     Home Medications Prior to Admission medications   Medication Sig Start Date End Date Taking? Authorizing Provider  acetaminophen (TYLENOL) 325 MG tablet Take 650 mg by mouth every 6 (six) hours as needed for moderate pain or headache.    [provider]  cyclobenzaprine (FLEXERIL) 10 MG tablet Take 1 tablet (10 mg total) by mouth 3 (three) times daily as needed for muscle spasms. Patient not taking: Reported on 09/23/2017 01/20/17   Donalee Citrin, MD  HYDROmorphone (DILAUDID) 4 MG tablet Take 1 tablet (4 mg total) by mouth every 3 (three) hours as needed for severe pain. 10/04/17   Donalee Citrin, MD      Allergies    Questran [cholestyramine]    Review of Systems   Review of Systems Review of systems completed and notable as per HPI.  ROS otherwise negative.   Physical Exam Updated Vital Signs BP 90/67   Pulse  100   Temp 98.7 F (37.1 C) (Axillary)   Resp 16   Ht 5\' 6"  (1.676 m)   Wt 72.6 kg   LMP  (LMP Unknown)   SpO2 (!) 89%   BMI 25.82 kg/m  Physical Exam Vitals and nursing note reviewed.  Constitutional:      Appearance: She is well-developed.     Comments: Tearful  HENT:     Head: Normocephalic and atraumatic.     Mouth/Throat:     Mouth: Mucous membranes are moist.     Pharynx: Oropharynx is clear.  Eyes:     Conjunctiva/sclera: Conjunctivae normal.  Cardiovascular:     Rate and Rhythm: Normal rate and regular rhythm.     Heart sounds: No murmur heard. Pulmonary:     Effort: Pulmonary effort is normal. No respiratory distress.     Breath sounds: Normal breath sounds.  Abdominal:     Palpations: Abdomen is soft.     Tenderness: There is no abdominal tenderness.  Musculoskeletal:        General: No swelling.     Cervical back: Neck supple.  Skin:    General: Skin is warm and dry.     Capillary Refill: Capillary refill takes less than 2 seconds.  Neurological:     General: No focal deficit present.     Mental Status: She is alert  and oriented to person, place, and time. Mental status is at baseline.     Cranial Nerves: No cranial nerve deficit.     Sensory: No sensory deficit.     Motor: No weakness.  Psychiatric:        Mood and Affect: Mood normal.     ED Results / Procedures / Treatments   Labs (all labs ordered are listed, but only abnormal results are displayed) Labs Reviewed  CBC WITH DIFFERENTIAL/PLATELET - Abnormal; Notable for the following components:      Result Value   WBC 12.2 (*)    Hemoglobin 11.2 (*)    HCT 34.6 (*)    Platelets 431 (*)    Eosinophils Absolute 0.8 (*)    All other components within normal limits  COMPREHENSIVE METABOLIC PANEL - Abnormal; Notable for the following components:   Glucose, Bld 106 (*)    Albumin 3.4 (*)    Total Bilirubin 0.2 (*)    All other components within normal limits    EKG EKG  Interpretation Date/Time:  Thursday July 01 2023 20:45:44 EDT Ventricular Rate:  109 PR Interval:  157 QRS Duration:  78 QT Interval:  321 QTC Calculation: 433 R Axis:   54  Text Interpretation: Sinus tachycardia Confirmed by Fulton Reek [phone]) on 07/01/2023 11:24:59 PM  Radiology No results found.  Procedures Procedures    Medications Ordered in ED Medications  lactated ringers bolus 1,000 mL (0 mLs Intravenous Stopped 07/01/23 2334)    ED Course/ Medical Decision Making/ A&P                             Medical Decision Making Amount and/or Complexity of Data Reviewed Labs: ordered.   Medical Decision Making:   Crystal Acosta is a 42 y.o. female who presented to the ED today with likely unintentional opioid overdose.  Vital signs reviewed notable for initial tachycardia.  She is quite anxious on initial arrival and regretful likely causing her tachycardia.  She is otherwise hemodynamically stable.  EKG is unremarkable.  I suspect h the pills she ingested was laced with likely opioids.  She denies SI or HI, I suspect this is an accidental overdose.  Will plan for 4-hour observation.  She is clear lungs bilaterally and no hypoxia, no indication for chest imaging at this time.   Patient placed on continuous vitals and telemetry monitoring while in ED which was reviewed periodically.  Reviewed and confirmed nursing documentation for past medical history, family history, social history.  Initial Study Results:   Laboratory  All laboratory results reviewed.  Labs notable for mild leukocytosis likely reactive, mild anemia.  EKG EKG was reviewed independently. Rate, rhythm, axis, intervals all examined and without medically relevant abnormality. ST segments without concerns for elevations.    Reassessment and Plan:   On reassessment she remained stable.  She is awake and alert.  Her anxiety and tachycardia have improved.  She is regretful for accidental overdose.  She  plans for abstinence.  Prescription written for Narcan, counseled on importance of avoiding illicit substances especially given risk for overdose.  She is understanding.  Handoff was given to Dr. Preston Fleeting at [phone] with plan to reassess at 4 hours and likely discharge home if she remained stable and has not required additional Narcan.   Patient's presentation is most consistent with acute presentation with potential threat to life or bodily function.  Final Clinical Impression(s) / ED Diagnoses Final diagnoses:  Accidental drug overdose, initial encounter    Rx / DC Orders ED Discharge Orders     None         Laurence Spates, MD 07/02/23 [phone]

## 2023-07-01 NOTE — Progress Notes (Signed)
   07/01/23 2240  Spiritual Encounters  Type of Visit Initial  Care provided to: Patient  Referral source Other (comment) (Chaplain's personal rounds)  Reason for visit Routine spiritual support  OnCall Visit Yes  Spiritual Framework  Presenting Themes Impactful experiences and emotions  Community/Connection Family;Friend(s)  Patient Stress Factors Health changes;Lack of knowledge  Interventions  Spiritual Care Interventions Made Established relationship of care and support;Compassionate presence;Reflective listening;Normalization of emotions  Intervention Outcomes  Outcomes Connection to spiritual care;Reduced isolation  Spiritual Care Plan  Spiritual Care Issues Still Outstanding Chaplain will continue to follow  Follow up plan  Chaplain will continue to monitor the patient's situation. Please contact the chaplain if patient becomes emotionally distressed, requiring support.   Chaplain completed rounds of ED. Chaplain observed pt in room. Chaplain checked with RN to assess whether it was acceptable for chaplain to introduce himself. Chaplain introduced self and role to pt. Pt immediately worried that chaplain had come to give "bad news", but chaplain assured her it was just an introductory visit. Pt was tearful throughout the visit. She repeatedly apologized for her current state, saying, "I'm not normally like this." She spoke at length about feeling "confused" and anxious because "I don't know what happened", saying, "I don't know if I touched something, or what". Pt expressed eagerness to be discharged and to be allowed to go home. Chaplain provided a compassionate presence in which reflective listening and normalization of emotions provided a space for pt to discuss current thoughts and feelings. Pt expressed gratitude for chaplain's visit.

## 2023-07-01 NOTE — ED Triage Notes (Signed)
Arrives [facility] from outside of an apartment complex beside of a car .  Called out for suspected overdose. Upon EMS arrival pt confused and GCS-13. Appeared cyanotic and breathing 7rr/min.   Administered 1mg  Narcan IM with positive improvement.   Upon arrival a/ox4. GCS-15. Pt keeps stating I was just taking my son to piano lessons. Admits to taking suboxone and was taking some Xanax today.

## 2023-07-01 NOTE — ED Provider Notes (Signed)
Care assumed from Dr. Earlene Plater, patient with apparent accidental opioid overdose being observed following IV naloxone.  12:29 AM Patient very drowsy, requiring nasal oxygen to maintain oxygen saturations.  She is arousable.  However, I feel that she needs additional naloxone.  Because of need for repeat dose, I have also ordered a naloxone infusion.  After three hours, I ordered the naloxone infusion be stopped. She continued to maintain adequate oxygen saturation, but has been extremely somnolent, but arousable. Also, she developed a low grade fever (100.6), and I ordered a dose of acetaminophen. Her blood pressure dropped into the 80's, and I ordered aggressive IV hydration. Last blood pressure is up to 90. She is still very somnolent, an dwill need additional observation. Case is signed out to Dr. Criss Alvine.  CRITICAL CARE Performed by: Dione Booze Total critical care time: 90 minutes Critical care time was exclusive of separately billable procedures and treating other patients. Critical care was necessary to treat or prevent imminent or life-threatening deterioration. Critical care was time spent personally by me on the following activities: development of treatment plan with patient and/or surrogate as well as nursing, discussions with consultants, evaluation of patient's response to treatment, examination of patient, obtaining history from patient or surrogate, ordering and performing treatments and interventions, ordering and review of laboratory studies, ordering and review of radiographic studies, pulse oximetry and re-evaluation of patient's condition.   Dione Booze, MD 07/02/23 0800

## 2023-07-02 ENCOUNTER — Emergency Department (HOSPITAL_COMMUNITY): Payer: Medicaid Other

## 2023-07-02 DIAGNOSIS — F1320 Sedative, hypnotic or anxiolytic dependence, uncomplicated: Secondary | ICD-10-CM | POA: Diagnosis not present

## 2023-07-02 DIAGNOSIS — F1490 Cocaine use, unspecified, uncomplicated: Secondary | ICD-10-CM

## 2023-07-02 DIAGNOSIS — T50901A Poisoning by unspecified drugs, medicaments and biological substances, accidental (unintentional), initial encounter: Secondary | ICD-10-CM | POA: Diagnosis present

## 2023-07-02 LAB — RAPID URINE DRUG SCREEN, HOSP PERFORMED
Amphetamines: NOT DETECTED
Barbiturates: NOT DETECTED
Benzodiazepines: POSITIVE — AB
Cocaine: POSITIVE — AB
Opiates: NOT DETECTED
Tetrahydrocannabinol: NOT DETECTED

## 2023-07-02 LAB — MRSA NEXT GEN BY PCR, NASAL: MRSA by PCR Next Gen: NOT DETECTED

## 2023-07-02 LAB — TSH: TSH: 0.532 u[IU]/mL (ref 0.350–4.500)

## 2023-07-02 LAB — URINALYSIS, W/ REFLEX TO CULTURE (INFECTION SUSPECTED)
Bacteria, UA: NONE SEEN
Bilirubin Urine: NEGATIVE
Glucose, UA: NEGATIVE mg/dL
Hgb urine dipstick: NEGATIVE
Ketones, ur: NEGATIVE mg/dL
Leukocytes,Ua: NEGATIVE
Nitrite: NEGATIVE
Protein, ur: NEGATIVE mg/dL
Specific Gravity, Urine: 1.023 (ref 1.005–1.030)
pH: 5 (ref 5.0–8.0)

## 2023-07-02 LAB — LACTIC ACID, PLASMA
Lactic Acid, Venous: 1.1 mmol/L (ref 0.5–1.9)
Lactic Acid, Venous: 0.8 mmol/L (ref 0.5–1.9)

## 2023-07-02 LAB — HEPATITIS PANEL, ACUTE
HCV Ab: NONREACTIVE
Hep A IgM: NONREACTIVE
Hep B C IgM: NONREACTIVE
Hepatitis B Surface Ag: NONREACTIVE

## 2023-07-02 LAB — I-STAT CG4 LACTIC ACID, ED
Lactic Acid, Venous: 0.5 mmol/L (ref 0.5–1.9)
Lactic Acid, Venous: 1 mmol/L (ref 0.5–1.9)

## 2023-07-02 LAB — I-STAT VENOUS BLOOD GAS, ED
Acid-base deficit: 1 mmol/L (ref 0.0–2.0)
Bicarbonate: 23.9 mmol/L (ref 20.0–28.0)
Calcium, Ion: 1.07 mmol/L — ABNORMAL LOW (ref 1.15–1.40)
HCT: 32 % — ABNORMAL LOW (ref 36.0–46.0)
Hemoglobin: 10.9 g/dL — ABNORMAL LOW (ref 12.0–15.0)
O2 Saturation: 94 %
Potassium: 3.7 mmol/L (ref 3.5–5.1)
Sodium: 139 mmol/L (ref 135–145)
TCO2: 25 mmol/L (ref 22–32)
pCO2, Ven: 41.1 mmHg — ABNORMAL LOW (ref 44–60)
pH, Ven: 7.373 (ref 7.25–7.43)
pO2, Ven: 71 mmHg — ABNORMAL HIGH (ref 32–45)

## 2023-07-02 LAB — BASIC METABOLIC PANEL
Anion gap: 5 (ref 5–15)
BUN: <5 mg/dL — ABNORMAL LOW (ref 6–20)
CO2: 25 mmol/L (ref 22–32)
Calcium: 7.9 mg/dL — ABNORMAL LOW (ref 8.9–10.3)
Chloride: 107 mmol/L (ref 98–111)
Creatinine, Ser: 0.58 mg/dL (ref 0.44–1.00)
GFR, Estimated: >60 mL/min (ref >60)
Glucose, Bld: 124 mg/dL — ABNORMAL HIGH (ref 70–99)
Potassium: 3.6 mmol/L (ref 3.5–5.1)
Sodium: 137 mmol/L (ref 135–145)

## 2023-07-02 LAB — HIV ANTIBODY (ROUTINE TESTING W REFLEX): HIV Screen 4th Generation wRfx: NONREACTIVE

## 2023-07-02 LAB — HCG, SERUM, QUALITATIVE: Preg, Serum: NEGATIVE

## 2023-07-02 LAB — ETHANOL: Alcohol, Ethyl (B): <10 mg/dL (ref <10)

## 2023-07-02 LAB — SARS CORONAVIRUS 2 BY RT PCR: SARS Coronavirus 2 by RT PCR: NEGATIVE

## 2023-07-02 MED ORDER — LACTATED RINGERS IV SOLN: INTRAVENOUS | Status: DC

## 2023-07-02 MED ORDER — SODIUM CHLORIDE 0.9 % IV BOLUS
1000.0000 mL | Freq: Once | INTRAVENOUS | Status: AC
  Administered 2023-07-02: 1000 mL via INTRAVENOUS

## 2023-07-02 MED ORDER — NALOXONE HCL 4 MG/10ML IJ SOLN
1.5000 mg/h | INTRAVENOUS | Status: DC
  Administered 2023-07-02 (×2): 1.5 mg/h via INTRAVENOUS
  Filled 2023-07-02 (×2): qty 10

## 2023-07-02 MED ORDER — NALOXONE HCL 0.4 MG/ML IJ SOLN: 0.4000 mg | INTRAMUSCULAR | Status: DC | PRN

## 2023-07-02 MED ORDER — SODIUM CHLORIDE 0.9 % IV BOLUS: 1000.0000 mL | Freq: Once | INTRAVENOUS | Status: DC

## 2023-07-02 MED ORDER — SODIUM CHLORIDE 0.9 % IV SOLN
INTRAVENOUS | Status: AC
  Filled 2023-07-02: qty 8

## 2023-07-02 MED ORDER — ACETAMINOPHEN 650 MG RE SUPP: 650.0000 mg | Freq: Four times a day (QID) | RECTAL | Status: DC | PRN

## 2023-07-02 MED ORDER — ACETAMINOPHEN 650 MG RE SUPP
650.0000 mg | Freq: Once | RECTAL | Status: AC
  Administered 2023-07-02: 650 mg via RECTAL

## 2023-07-02 MED ORDER — ENOXAPARIN SODIUM 40 MG/0.4ML IJ SOSY
40.0000 mg | PREFILLED_SYRINGE | Freq: Every day | INTRAMUSCULAR | Status: DC
  Administered 2023-07-02: 40 mg via SUBCUTANEOUS
  Filled 2023-07-02: qty 0.4

## 2023-07-02 MED ORDER — SENNOSIDES-DOCUSATE SODIUM 8.6-50 MG PO TABS: 1.0000 | ORAL_TABLET | Freq: Every evening | ORAL | Status: DC | PRN

## 2023-07-02 MED ORDER — NALOXONE HCL 0.4 MG/ML IJ SOLN
0.4000 mg | Freq: Once | INTRAMUSCULAR | Status: AC
  Administered 2023-07-02: 0.4 mg via INTRAVENOUS

## 2023-07-02 MED ORDER — ACETAMINOPHEN 325 MG PO TABS
650.0000 mg | ORAL_TABLET | Freq: Four times a day (QID) | ORAL | Status: DC | PRN
  Administered 2023-07-03: 650 mg via ORAL
  Filled 2023-07-02: qty 2

## 2023-07-02 MED ORDER — ACETAMINOPHEN 325 MG PO TABS: 650.0000 mg | ORAL_TABLET | Freq: Once | ORAL | Status: DC

## 2023-07-02 MED ORDER — ENOXAPARIN SODIUM 40 MG/0.4ML IJ SOSY
PREFILLED_SYRINGE | INTRAMUSCULAR | Status: AC
  Filled 2023-07-02: qty 0.4

## 2023-07-02 MED ORDER — NALOXONE HCL 0.4 MG/ML IJ SOLN
INTRAMUSCULAR | Status: AC
  Filled 2023-07-02: qty 1

## 2023-07-02 MED ORDER — SODIUM CHLORIDE 0.9 % IV SOLN
3.0000 g | Freq: Once | INTRAVENOUS | Status: AC
  Administered 2023-07-02: 3 g via INTRAVENOUS

## 2023-07-02 MED ORDER — ACETAMINOPHEN 650 MG RE SUPP
RECTAL | Status: AC
  Filled 2023-07-02: qty 1

## 2023-07-02 MED ORDER — NALOXONE HCL 4 MG/0.1ML NA LIQD: NASAL | 0 refills | Status: AC

## 2023-07-02 MED ORDER — ACETAMINOPHEN 325 MG PO TABS: 650.0000 mg | ORAL_TABLET | Freq: Once | ORAL | Status: DC | PRN

## 2023-07-02 MED ORDER — NALOXONE HCL 2 MG/2ML IJ SOSY
2.0000 mg | PREFILLED_SYRINGE | Freq: Once | INTRAMUSCULAR | Status: AC
  Administered 2023-07-02: 2 mg via INTRAVENOUS
  Filled 2023-07-02: qty 2

## 2023-07-02 NOTE — ED Notes (Signed)
Dr. Preston Fleeting aware patients BP 84/50 after 700 mls of NS infusing. Order received for 1 additional liter to infuse. 2nd IV started. Patient remains on 3 L of O2 per [facility]. ETCO2 reading at 50. Patient remains lethargic.

## 2023-07-02 NOTE — ED Notes (Signed)
Patient authorized her mother to have access to any and all medical records verbally in front of this Clinical research associate and RN [employer]   Same also completed the Request & Authorization For Use/Disclosure of Protected Health Information Form.   Form placed in medical records bin with [name] sticker on same.

## 2023-07-02 NOTE — ED Notes (Signed)
2C called for update on dirty/cleaning room.

## 2023-07-02 NOTE — ED Notes (Signed)
Dr. Preston Fleeting notified temp is still 100.6 one hour post tylenol and BP trending softer at 96/59 but map still in the 70s. Received verbal for 1 L of NS admin.

## 2023-07-02 NOTE — Discharge Instructions (Signed)
You were seen today after an accidental overdose.  You should avoid the use of any illicit substances.  You are being prescribed a dose of Narcan.  You should follow-up with a primary care provider.  You can return for any new or worsening symptoms.

## 2023-07-02 NOTE — ED Provider Notes (Signed)
Care transferred to me. I have broadened the workup. VBG is ok. Lactate normal. CXR with some atelectasis, but with low grade fever and hypoxia, consider aspiration. Thus I will place on unasyn. She has already received 3L of IVF. I suspect low BP is partially narcotic overdose. She is awake and able to talk at times, but quickly falls asleep. One time she was only breathing 7/min, so 0.4 narcan was given with good response. Remains mildly hypoxic. Thus will need admission. I don't think her low BP represents sepsis. Discussed with internal medicine teaching service for admission.   Pricilla Loveless, MD 07/02/23 [phone]

## 2023-07-02 NOTE — Progress Notes (Signed)
Internal Medicine Teaching Service Attending Note Date: 07/02/2023  Patient name: Crystal Acosta  Medical record number: [id number]  Date of birth: 15-Oct-1981   I have seen and evaluated Crystal Acosta and discussed their care with the Residency Team.  Discussed with Dr Park Breed.  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  She denies IVDA.  Her last HIV/Hep C screening was ~ 2 years ago on joining a new suboxonne program.  She was with a friend this am and took 1 pill of a medication she was not clear of. She became significantly drowsy. Her friend did as well and pt called EMS. Pt was started on naloxone drip and woke up slowly.  She is currently has clear mentation.  Physical Exam: Blood pressure (!) 88/57, pulse 64, temperature 98.8 F (37.1 C), temperature source Oral, resp. rate 14, height 5\' 6"  (1.676 m), weight 72.6 kg, SpO2 91%. General appearance: no distress Eyes: negative Throat: normal findings: buccal mucosa normal and oropharynx pink & moist without lesions or evidence of thrush Neck: no adenopathy and supple, symmetrical, trachea midline Resp: clear to auscultation bilaterally Cardio: regular rate and rhythm GI: normal findings: bowel sounds normal and soft, non-tender Extremities: edema none  Lab results: Results for orders placed or performed during the hospital encounter of 07/01/23 (from the past 24 hour(s))  CBC with Differential     Status: Abnormal   Collection Time: 07/01/23  8:51 PM  Result Value Ref Range   WBC 12.2 (H) 4.0 - 10.5 K/uL   RBC 3.94 3.87 - 5.11 MIL/uL   Hemoglobin 11.2 (L) 12.0 - 15.0 g/dL   HCT 40.9 (L) 81.1 - 91.4 %   MCV 87.8 80.0 - 100.0 fL   MCH 28.4 26.0 - 34.0 pg   MCHC 32.4 30.0 - 36.0 g/dL   RDW 78.2 95.6 - 21.3 %   Platelets 431 (H) 150 - 400 K/uL   nRBC 0.0 0.0 - 0.2 %   Neutrophils Relative % 62 %   Neutro Abs 7.5 1.7 - 7.7 K/uL   Lymphocytes Relative 23 %   Lymphs Abs  2.9 0.7 - 4.0 K/uL   Monocytes Relative 7 %   Monocytes Absolute 0.9 0.1 - 1.0 K/uL   Eosinophils Relative 7 %   Eosinophils Absolute 0.8 (H) 0.0 - 0.5 K/uL   Basophils Relative 1 %   Basophils Absolute 0.1 0.0 - 0.1 K/uL   Immature Granulocytes 0 %   Abs Immature Granulocytes 0.04 0.00 - 0.07 K/uL  Comprehensive metabolic panel     Status: Abnormal   Collection Time: 07/01/23  8:51 PM  Result Value Ref Range   Sodium 140 135 - 145 mmol/L   Potassium 3.7 3.5 - 5.1 mmol/L   Chloride 99 98 - 111 mmol/L   CO2 26 22 - 32 mmol/L   Glucose, Bld 106 (H) 70 - 99 mg/dL   BUN 8 6 - 20 mg/dL   Creatinine, Ser 0.86 0.44 - 1.00 mg/dL   Calcium 8.9 8.9 - 57.8 mg/dL   Total Protein 6.5 6.5 - 8.1 g/dL   Albumin 3.4 (L) 3.5 - 5.0 g/dL   AST 16 15 - 41 U/L   ALT 13 0 - 44 U/L   Alkaline Phosphatase 65 38 - 126 U/L   Total Bilirubin 0.2 (L) 0.3 - 1.2 mg/dL   GFR, Estimated >46 >96 mL/min   Anion gap 15 5 - 15  Urinalysis, w/ Reflex to Culture (Infection Suspected) -Urine, Clean Catch     Status: None   Collection Time: 07/02/23  7:52 AM  Result Value Ref Range   Specimen Source URINE, CLEAN CATCH    Color, Urine YELLOW YELLOW   APPearance CLEAR CLEAR   Specific Gravity, Urine 1.023 1.005 - 1.030   pH 5.0 5.0 - 8.0   Glucose, UA NEGATIVE NEGATIVE mg/dL   Hgb urine dipstick NEGATIVE NEGATIVE   Bilirubin Urine NEGATIVE NEGATIVE   Ketones, ur NEGATIVE NEGATIVE mg/dL   Protein, ur NEGATIVE NEGATIVE mg/dL   Nitrite NEGATIVE NEGATIVE   Leukocytes,Ua NEGATIVE NEGATIVE   RBC / HPF 0-5 0 - 5 RBC/hpf   WBC, UA 0-5 0 - 5 WBC/hpf   Bacteria, UA NONE SEEN NONE SEEN   Squamous Epithelial / HPF 0-5 0 - 5 /HPF   Mucus PRESENT   SARS Coronavirus 2 by RT PCR (hospital order, performed in [facility] hospital lab) *cepheid single result test* Anterior Nasal Swab     Status: None   Collection Time: 07/02/23  7:52 AM   Specimen: Anterior Nasal Swab  Result Value Ref Range   SARS Coronavirus 2 by RT PCR  NEGATIVE NEGATIVE  Ethanol     Status: None   Collection Time: 07/02/23  8:04 AM  Result Value Ref Range   Alcohol, Ethyl (B) <10 <10 mg/dL  I-Stat venous blood gas, ED     Status: Abnormal   Collection Time: 07/02/23  8:13 AM  Result Value Ref Range   pH, Ven 7.373 7.25 - 7.43   pCO2, Ven 41.1 (L) 44 - 60 mmHg   pO2, Ven 71 (H) 32 - 45 mmHg   Bicarbonate 23.9 20.0 - 28.0 mmol/L   TCO2 25 22 - 32 mmol/L   O2 Saturation 94 %   Acid-base deficit 1.0 0.0 - 2.0 mmol/L   Sodium 139 135 - 145 mmol/L   Potassium 3.7 3.5 - 5.1 mmol/L   Calcium, Ion 1.07 (L) 1.15 - 1.40 mmol/L   HCT 32.0 (L) 36.0 - 46.0 %   Hemoglobin 10.9 (L) 12.0 - 15.0 g/dL   Sample type VENOUS   I-Stat Lactic Acid     Status: None   Collection Time: 07/02/23  8:13 AM  Result Value Ref Range   Lactic Acid, Venous 0.5 0.5 - 1.9 mmol/L  Lactic acid, plasma     Status: None   Collection Time: 07/02/23 10:22 AM  Result Value Ref Range   Lactic Acid, Venous 1.1 0.5 - 1.9 mmol/L  Urine rapid drug screen (hosp performed)     Status: Abnormal   Collection Time: 07/02/23 11:30 AM  Result Value Ref Range   Opiates NONE DETECTED NONE DETECTED   Cocaine POSITIVE (A) NONE DETECTED   Benzodiazepines POSITIVE (A) NONE DETECTED   Amphetamines NONE DETECTED NONE DETECTED   Tetrahydrocannabinol NONE DETECTED NONE DETECTED   Barbiturates NONE DETECTED NONE DETECTED  hCG, serum, qualitative     Status: None   Collection Time: 07/02/23 12:00 PM  Result Value Ref Range   Preg, Serum NEGATIVE NEGATIVE  HIV Antibody (routine testing w rflx)     Status: None   Collection Time: 07/02/23 12:00 PM  Result Value Ref Range   HIV Screen 4th Generation wRfx Non Reactive Non Reactive  Basic metabolic panel     Status: Abnormal   Collection Time: 07/02/23 12:00 PM  Result Value Ref Range   Sodium 137 135 - 145 mmol/L  Potassium 3.6 3.5 - 5.1 mmol/L   Chloride 107 98 - 111 mmol/L   CO2 25 22 - 32 mmol/L   Glucose, Bld 124 (H) 70 - 99  mg/dL   BUN <5 (L) 6 - 20 mg/dL   Creatinine, Ser 1.61 0.44 - 1.00 mg/dL   Calcium 7.9 (L) 8.9 - 10.3 mg/dL   GFR, Estimated >09 >60 mL/min   Anion gap 5 5 - 15  TSH     Status: None   Collection Time: 07/02/23 12:00 PM  Result Value Ref Range   TSH 0.532 0.350 - 4.500 uIU/mL  I-Stat Lactic Acid     Status: None   Collection Time: 07/02/23 12:40 PM  Result Value Ref Range   Lactic Acid, Venous 1.0 0.5 - 1.9 mmol/L    Imaging results:  DG Chest Portable 1 View  Result Date: 07/02/2023 CLINICAL DATA:  Hypoxia EXAM: PORTABLE CHEST 1 VIEW COMPARISON:  Chest x-ray dated December 16, 2014 FINDINGS: Cardiac and mediastinal contours within normal limits. Mild bibasilar opacities. No evidence of pleural effusion pneumothorax. IMPRESSION: Mild bibasilar opacities, likely atelectasis. Electronically Signed   By: Allegra Lai M.D.   On: 07/02/2023 09:22    Assessment and Plan: I agree with the formulated Assessment and Plan with the following changes:  Cocaine+ Benzo+  Will watch her while she detoxes Nalaxone at her baseline dose Hold flumazenil due to risk of seizures.  Check HIV, Acute hepatitis panel, GC/Chlamydia   **Disclaimer: This note may have been dictated with voice recognition software. Similar sounding words can inadvertently be transcribed and this note may contain transcription errors which may not have been corrected upon publication of note.**

## 2023-07-02 NOTE — ED Notes (Signed)
Obtained manual BP of 88/48 notified Dr. Preston Fleeting of same. Patient has has 1,300 mls of NS with no improvement in blood pressure. No additional orders received.

## 2023-07-02 NOTE — Progress Notes (Signed)
ED Pharmacy Antibiotic Sign Off An antibiotic consult was received from an ED provider for unasyn per pharmacy dosing for aspiration pneumonia. A chart review was completed to assess appropriateness.   The following one time order(s) were placed:  Unasyn 3g IV x 1  Further antibiotic and/or antibiotic pharmacy consults should be ordered by the admitting provider if indicated.   Thank you for allowing pharmacy to be a part of this patient's care.   Kajsa Butrum, Drake Leach, [facility]  Clinical Pharmacist 07/02/23 8:44 AM

## 2023-07-02 NOTE — ED Notes (Signed)
Upon staff in and out cathing the pt. Small container of white pills and power with a straw found between pt legs. Pills and power disposed of in hazard box per security

## 2023-07-02 NOTE — ED Notes (Signed)
ED TO INPATIENT HANDOFF REPORT  ED Nurse Name and Phone #: [phone]  S Name/Age/Gender Crystal Acosta 42 y.o. female Room/Bed: TRACC/TRACC  Code Status   Code Status: Full Code  Home/SNF/Other Home Patient oriented to: self, place, time, and situation Is this baseline? Yes   Triage Complete: Triage complete  Chief Complaint Overdose of drug/medicinal substance [T50.901A]  Triage Note Arrives [facility] from outside of an apartment complex beside of a car .  Called out for suspected overdose. Upon EMS arrival pt confused and GCS-13. Appeared cyanotic and breathing 7rr/min.   Administered 1mg  Narcan IM with positive improvement.   Upon arrival a/ox4. GCS-15. Pt keeps stating I was just taking my son to piano lessons. Admits to taking suboxone and was taking some Xanax today.    Allergies Allergies  Allergen Reactions   Questran [Cholestyramine] Hives    Level of Care/Admitting Diagnosis ED Disposition     ED Disposition  Admit   Condition  --   Comment  Hospital Area: [facility] [[id number]]  Level of Care: Progressive [102]  Admit to Progressive based on following criteria: MULTISYSTEM THREATS such as stable sepsis, metabolic/electrolyte imbalance with or without encephalopathy that is responding to early treatment.  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   Depression    Gallstones    H/O varicella    Insomnia    Pneumonia 2016   Past Surgical History:  Procedure  Laterality Date   ABDOMINAL EXPOSURE N/A 10/10/2014   Procedure: ABDOMINAL EXPOSURE;  Surgeon: Larina Earthly, MD;  Location: [facility];  Service: Vascular;  Laterality: N/A;  ABDOMINAL EXPOSURE   ANTERIOR LUMBAR FUSION N/A 10/10/2014   Procedure: ANTERIOR LUMBAR FUSION 1 LEVEL;  Surgeon: Mariam Dollar, MD;  Location: [facility];  Service: Neurosurgery;  Laterality: N/A;  ANTERIOR LUMBAR FUSION 1 LEVEL LUMBAR 5-SACRAL 1   BACK SURGERY     lumber disc   CHOLECYSTECTOMY     CHOLECYSTECTOMY  07/11/2012   Procedure: LAPAROSCOPIC CHOLECYSTECTOMY WITH INTRAOPERATIVE CHOLANGIOGRAM;  Surgeon: Almond Lint, MD;  Location: [facility];  Service: General;  Laterality: N/A;   ERCP  07/12/2012   Procedure: ENDOSCOPIC RETROGRADE CHOLANGIOPANCREATOGRAPHY (ERCP);  Surgeon: Petra Kuba, MD;  Location: [facility];  Service: Endoscopy;  Laterality: N/A;   ESOPHAGOGASTRODUODENOSCOPY  08/01/2012   Procedure: ESOPHAGOGASTRODUODENOSCOPY (EGD);  Surgeon: Florencia Reasons, MD;  Location: [facility];  Service: Endoscopy;  Laterality: N/A;   LUMBAR LAMINECTOMY/DECOMPRESSION MICRODISCECTOMY Left 01/20/2017   Procedure: Left Lumbar Four-Five Microdiscectomy;  Surgeon: Donalee Citrin, MD;  Location: [facility] OR;  Service: Neurosurgery;  Laterality: Left;     A IV Location/Drains/Wounds Patient Lines/Drains/Airways Status     Active Line/Drains/Airways     Name Placement date Placement time Site Days   Peripheral IV 07/01/23 20 G 1" Left;Posterior Hand 07/01/23  2048  Hand  1   Closed System Drain 1 Posterior Back Accordion (Hemovac) 10 Fr. 09/30/17  1603  Back  2101   Incision (Closed)  01/20/17 Back Other (Comment) 01/20/17  1031  -- 2354   Incision (Closed) 09/30/17 Back 09/30/17  1523  -- 2101            Intake/Output Last 24 hours  Intake/Output Summary (Last 24 hours) at 07/02/2023 1615 Last data filed at 07/02/2023 6387 Gross per 24 hour  Intake 2000 ml  Output --  Net 2000 ml    Labs/Imaging Results for orders  placed or performed during the hospital encounter of 07/01/23 (from the past 48 hour(s))  CBC with Differential     Status: Abnormal   Collection Time: 07/01/23  8:51 PM  Result Value Ref Range   WBC 12.2 (H) 4.0 - 10.5 K/uL   RBC 3.94 3.87 - 5.11 MIL/uL   Hemoglobin 11.2 (L) 12.0 - 15.0 g/dL   HCT 56.4 (L) 33.2 - 95.1 %   MCV 87.8 80.0 - 100.0 fL   MCH 28.4 26.0 - 34.0 pg   MCHC 32.4 30.0 - 36.0 g/dL   RDW 88.4 16.6 - 06.3 %   Platelets 431 (H) 150 - 400 K/uL   nRBC 0.0 0.0 - 0.2 %   Neutrophils Relative % 62 %   Neutro Abs 7.5 1.7 - 7.7 K/uL   Lymphocytes Relative 23 %   Lymphs Abs 2.9 0.7 - 4.0 K/uL   Monocytes Relative 7 %   Monocytes Absolute 0.9 0.1 - 1.0 K/uL   Eosinophils Relative 7 %   Eosinophils Absolute 0.8 (H) 0.0 - 0.5 K/uL   Basophils Relative 1 %   Basophils Absolute 0.1 0.0 - 0.1 K/uL   Immature Granulocytes 0 %   Abs Immature Granulocytes 0.04 0.00 - 0.07 K/uL    Comment: Performed at [facility], [address] [place] [address], [place], [place] [address]  Comprehensive metabolic panel     Status: Abnormal   Collection Time: 07/01/23  8:51 PM  Result Value Ref Range   Sodium 140 135 - 145 mmol/L   Potassium 3.7 3.5 - 5.1 mmol/L   Chloride 99 98 - 111 mmol/L   CO2 26 22 - 32 mmol/L   Glucose, Bld 106 (H) 70 - 99 mg/dL    Comment: Glucose reference range applies only to samples taken after fasting for at least 8 hours.   BUN 8 6 - 20 mg/dL   Creatinine, Ser 0.93 0.44 - 1.00 mg/dL   Calcium 8.9 8.9 - 23.5 mg/dL   Total Protein 6.5 6.5 - 8.1 g/dL   Albumin 3.4 (L) 3.5 - 5.0 g/dL   AST 16 15 - 41 U/L   ALT 13 0 - 44 U/L   Alkaline Phosphatase 65 38 - 126 U/L   Total Bilirubin 0.2 (L) 0.3 - 1.2 mg/dL   GFR, Estimated >57 >32 mL/min    Comment: (NOTE) Calculated using the CKD-EPI Creatinine Equation (2021)    Anion gap 15 5 - 15    Comment: Performed at [facility] Lab, [address] [place], [place] [address]  Urinalysis, w/ Reflex to Culture (Infection  Suspected) -Urine, Clean Catch     Status: None   Collection Time: 07/02/23  7:52 AM  Result Value Ref Range   Specimen Source URINE, CLEAN CATCH    Color, Urine YELLOW YELLOW   APPearance CLEAR CLEAR   Specific Gravity, Urine 1.023 1.005 - 1.030   pH 5.0 5.0 - 8.0   Glucose, UA NEGATIVE NEGATIVE mg/dL   Hgb urine dipstick NEGATIVE NEGATIVE   Bilirubin Urine NEGATIVE NEGATIVE  Ketones, ur NEGATIVE NEGATIVE mg/dL   Protein, ur NEGATIVE NEGATIVE mg/dL   Nitrite NEGATIVE NEGATIVE   Leukocytes,Ua NEGATIVE NEGATIVE   RBC / HPF 0-5 0 - 5 RBC/hpf   WBC, UA 0-5 0 - 5 WBC/hpf    Comment:        Reflex urine culture not performed if WBC <=10, OR if Squamous epithelial cells >5. If Squamous epithelial cells >5 suggest recollection.    Bacteria, UA NONE SEEN NONE SEEN   Squamous Epithelial / HPF 0-5 0 - 5 /HPF   Mucus PRESENT     Comment: Performed at [facility], [address] [place], [place] [address]  SARS Coronavirus 2 by RT PCR (hospital order, performed in [facility] hospital lab) *cepheid single result test* Anterior Nasal Swab     Status: None   Collection Time: 07/02/23  7:52 AM   Specimen: Anterior Nasal Swab  Result Value Ref Range   SARS Coronavirus 2 by RT PCR NEGATIVE NEGATIVE    Comment: Performed at [facility], [address] [place], [place] [address]  Ethanol     Status: None   Collection Time: 07/02/23  8:04 AM  Result Value Ref Range   Alcohol, Ethyl (B) <10 <10 mg/dL    Comment: (NOTE) Lowest detectable limit for serum alcohol is 10 mg/dL.  For medical purposes only. Performed at [facility], [address], [place], [place] [address]   I-Stat venous blood gas, ED     Status: Abnormal   Collection Time: 07/02/23  8:13 AM  Result Value Ref Range   pH, Ven 7.373 7.25 - 7.43   pCO2, Ven 41.1 (L) 44 - 60 mmHg   pO2, Ven 71 (H) 32 - 45 mmHg   Bicarbonate 23.9 20.0 - 28.0 mmol/L   TCO2 25 22 - 32 mmol/L   O2 Saturation 94 %    Acid-base deficit 1.0 0.0 - 2.0 mmol/L   Sodium 139 135 - 145 mmol/L   Potassium 3.7 3.5 - 5.1 mmol/L   Calcium, Ion 1.07 (L) 1.15 - 1.40 mmol/L   HCT 32.0 (L) 36.0 - 46.0 %   Hemoglobin 10.9 (L) 12.0 - 15.0 g/dL   Sample type VENOUS   I-Stat Lactic Acid     Status: None   Collection Time: 07/02/23  8:13 AM  Result Value Ref Range   Lactic Acid, Venous 0.5 0.5 - 1.9 mmol/L  Lactic acid, plasma     Status: None   Collection Time: 07/02/23 10:22 AM  Result Value Ref Range   Lactic Acid, Venous 1.1 0.5 - 1.9 mmol/L    Comment: Performed at [facility], [address], [place], [place] [address]  Urine rapid drug screen (hosp performed)     Status: Abnormal   Collection Time: 07/02/23 11:30 AM  Result Value Ref Range   Opiates NONE DETECTED NONE DETECTED   Cocaine POSITIVE (A) NONE DETECTED   Benzodiazepines POSITIVE (A) NONE DETECTED   Amphetamines NONE DETECTED NONE DETECTED   Tetrahydrocannabinol NONE DETECTED NONE DETECTED   Barbiturates NONE DETECTED NONE DETECTED    Comment: (NOTE) DRUG SCREEN FOR MEDICAL PURPOSES ONLY.  IF CONFIRMATION IS NEEDED FOR ANY PURPOSE, NOTIFY LAB WITHIN 5 DAYS.  LOWEST DETECTABLE LIMITS FOR URINE DRUG SCREEN Drug Class                     Cutoff (ng/mL) Amphetamine and metabolites    1000 Barbiturate and  metabolites    200 Benzodiazepine                 200 Opiates and metabolites        300 Cocaine and metabolites        300 THC                            50 Performed at [facility], [address] [place], [place] [address]   hCG, serum, qualitative     Status: None   Collection Time: 07/02/23 12:00 PM  Result Value Ref Range   Preg, Serum NEGATIVE NEGATIVE    Comment:        THE SENSITIVITY OF THIS METHODOLOGY IS >10 mIU/mL. Performed at [facility] Lab, [address], [place], [place] [address]   HIV Antibody (routine testing w rflx)     Status: None   Collection Time: 07/02/23 12:00 PM  Result Value Ref Range    HIV Screen 4th Generation wRfx Non Reactive Non Reactive    Comment: Performed at [facility], [address] [place], [place] [address]  Basic metabolic panel     Status: Abnormal   Collection Time: 07/02/23 12:00 PM  Result Value Ref Range   Sodium 137 135 - 145 mmol/L   Potassium 3.6 3.5 - 5.1 mmol/L   Chloride 107 98 - 111 mmol/L   CO2 25 22 - 32 mmol/L   Glucose, Bld 124 (H) 70 - 99 mg/dL    Comment: Glucose reference range applies only to samples taken after fasting for at least 8 hours.   BUN <5 (L) 6 - 20 mg/dL   Creatinine, Ser 0.10 0.44 - 1.00 mg/dL   Calcium 7.9 (L) 8.9 - 10.3 mg/dL   GFR, Estimated >27 >25 mL/min    Comment: (NOTE) Calculated using the CKD-EPI Creatinine Equation (2021)    Anion gap 5 5 - 15    Comment: Performed at [facility], [address] [place], [place] [address]  TSH     Status: None   Collection Time: 07/02/23 12:00 PM  Result Value Ref Range   TSH 0.532 0.350 - 4.500 uIU/mL    Comment: Performed by a 3rd Generation assay with a functional sensitivity of <=0.01 uIU/mL. Performed at [facility], [address], [place], [place] [address]   I-Stat Lactic Acid     Status: None   Collection Time: 07/02/23 12:40 PM  Result Value Ref Range   Lactic Acid, Venous 1.0 0.5 - 1.9 mmol/L   DG Chest Portable 1 View  Result Date: 07/02/2023 CLINICAL DATA:  Hypoxia EXAM: PORTABLE CHEST 1 VIEW COMPARISON:  Chest x-ray dated December 16, 2014 FINDINGS: Cardiac and mediastinal contours within normal limits. Mild bibasilar opacities. No evidence of pleural effusion pneumothorax. IMPRESSION: Mild bibasilar opacities, likely atelectasis. Electronically Signed   By: Allegra Lai M.D.   On: 07/02/2023 09:22    Pending Labs Unresulted Labs (From admission, onward)     Start     Ordered   07/02/23 1022  Lactic acid, plasma  STAT Now then every 3 hours,   R      07/02/23 1022            Vitals/Pain Today's Vitals   07/02/23  1245 07/02/23 1300 07/02/23 1315 07/02/23 1330  BP: (!) 87/58 (!) 81/63 (!) 93/53 (!) 88/57  Pulse: 66 70 69 64  Resp: 11 14 14    Temp:      TempSrc:      SpO2: (!) 84% 92% 93% 91%  Weight:      Height:      PainSc:        Isolation Precautions No active isolations  Medications Medications  sodium chloride 0.9 % bolus 1,000 mL (1,000 mLs Intravenous Not Given 07/02/23 0540)  enoxaparin (LOVENOX) injection 40 mg (40 mg Subcutaneous Given 07/02/23 1123)  acetaminophen (TYLENOL) tablet 650 mg (has no administration in time range)    Or  acetaminophen (TYLENOL) suppository 650 mg (has no administration in time range)  senna-docusate (Senokot-S) tablet 1 tablet (has no administration in time range)  lactated ringers infusion ( Intravenous New Bag/Given 07/02/23 1123)  naloxone (NARCAN) injection 0.4 mg (has no administration in time range)  acetaminophen (TYLENOL) 650 MG suppository (has no administration in time range)  lactated ringers bolus 1,000 mL (0 mLs Intravenous Stopped 07/01/23 2334)  naloxone (NARCAN) injection 2 mg (2 mg Intravenous Given 07/02/23 0033)  acetaminophen (TYLENOL) suppository 650 mg (650 mg Rectal Given 07/02/23 0426)  sodium chloride 0.9 % bolus 1,000 mL (0 mLs Intravenous Stopped 07/02/23 0745)  sodium chloride 0.9 % bolus 1,000 mL (0 mLs Intravenous Stopped 07/02/23 0652)  naloxone (NARCAN) injection 0.4 mg (0.4 mg Intravenous Given 07/02/23 0827)  Ampicillin-Sulbactam (UNASYN) 3 g in sodium chloride 0.9 % 100 mL IVPB (0 g Intravenous Stopped 07/02/23 1102)    Mobility walks     Focused Assessments Neuro Assessment Handoff:  Swallow screen pass? Yes  Cardiac Rhythm: Sinus tachycardia       Neuro Assessment:   Neuro Checks:      Has TPA been given? No If patient is a Neuro Trauma and patient is going to OR before floor call report to 4N Charge nurse: [phone] or [phone]   R Recommendations: See Admitting Provider Note  Report given to:    Additional Notes:

## 2023-07-02 NOTE — H&P (Signed)
Date: 07/02/2023               Patient Name:  Crystal Acosta MRN: [id number]  DOB: [birth year] Age / Sex: 42 y.o., female   PCP: Betsy Coder Zannie Cove, MD         Medical Service: Internal Medicine Teaching Service         Attending Physician: Dr. Ginnie Smart, MD       First Contact: Priscila Colbert Coyer, MD        Pager: [phone]        Second Contact: Morene Crocker, MD    Pager: [facility] [phone]    Chief Complaint: Drug overdose  History of Present Illness:  Pt is a 42 year old female with hx of chronic back pain and polysubstance use presented to the ED via EMS after drug overdose. She responded to naloxone given by EMS and stated she took a benzo pill from friend. States her friend "turned blue" after taking it and she called 911. She took her suboxone but states at normal dose.   States having some back pain but no other symptoms.   Review of Systems negative unless stated in the HPI.  In the ED, pt was given multiple doses of naloxone with improvement. Her BP remained soft and was given IVF but did not improve with IVF. Her lactic acid was normal and MAP>65. After prolonged stay in the ED hoping for discharge, she was admitted for detoxification. UDS was ordered and was positive for cocaine and bezos.   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    Allergies: Allergies as of 07/01/2023 - Review Complete 07/01/2023   Allergen Reaction Noted   Questran [cholestyramine] Hives 07/29/2012    Past Surgical History: Past Surgical History:  Procedure Laterality Date   ABDOMINAL EXPOSURE N/A 10/10/2014   Procedure: ABDOMINAL EXPOSURE;  Surgeon: Larina Earthly, MD;  Location: [facility];  Service: Vascular;  Laterality: N/A;  ABDOMINAL EXPOSURE   ANTERIOR LUMBAR FUSION N/A 10/10/2014   Procedure: ANTERIOR LUMBAR FUSION 1 LEVEL;  Surgeon: Mariam Dollar, MD;  Location: [facility];  Service: Neurosurgery;  Laterality: N/A;  ANTERIOR LUMBAR FUSION 1 LEVEL LUMBAR 5-SACRAL 1   BACK SURGERY     lumber disc   CHOLECYSTECTOMY     CHOLECYSTECTOMY  07/11/2012   Procedure: LAPAROSCOPIC CHOLECYSTECTOMY WITH INTRAOPERATIVE CHOLANGIOGRAM;  Surgeon: Almond Lint, MD;  Location: [facility];  Service: General;  Laterality: N/A;   ERCP  07/12/2012   Procedure: ENDOSCOPIC RETROGRADE CHOLANGIOPANCREATOGRAPHY (ERCP);  Surgeon: Petra Kuba, MD;  Location: [facility];  Service: Endoscopy;  Laterality: N/A;   ESOPHAGOGASTRODUODENOSCOPY  08/01/2012   Procedure: ESOPHAGOGASTRODUODENOSCOPY (EGD);  Surgeon: Florencia Reasons, MD;  Location: [facility];  Service: Endoscopy;  Laterality: N/A;   LUMBAR LAMINECTOMY/DECOMPRESSION MICRODISCECTOMY Left 01/20/2017   Procedure: Left Lumbar Four-Five Microdiscectomy;  Surgeon: Jillyn Hidden  Wynetta Emery, MD;  Location: [facility] OR;  Service: Neurosurgery;  Laterality: Left;    Family History:  Family History  Problem Relation Age of Onset   Hypertension Mother    Heart disease Maternal Grandfather     Social History: Was able to get answer intermittently but pt kept falling asleep.  Lives at home with son.  Currently buy and sells merchandise.  Does not use alcohol.  Illicit drug use- pt has hx of substance use.  IADLs/ADLs- can person independently at baseline   Physical Exam: Blood pressure (!) 81/59, pulse 64, temperature 98.8 F (37.1 C), temperature source Oral, resp. rate 10, height 5\' 6"  (1.676 m), weight  72.6 kg, SpO2 96%. General: somnolent HENT: NCAT Lungs: bibasilar crackles L>R Cardiovascular: NSR, good pulses Abdomen: No TTP, bowel sounds present MSK: no asymmetry Skin: no lesions on exposed skin Neuro: somnolent but oriented x4, CNII-XII grossly normal, good strength and sensation in all extremities.  Psych: unable to assess  Diagnostics:     Latest Ref Rng & Units 07/02/2023    8:13 AM 07/01/2023    8:51 PM 09/27/2017   10:13 AM  CBC  WBC 4.0 - 10.5 K/uL  12.2  6.2   Hemoglobin 12.0 - 15.0 g/dL 40.9  81.1  91.4   Hematocrit 36.0 - 46.0 % 32.0  34.6  42.3   Platelets 150 - 400 K/uL  431  358        Latest Ref Rng & Units 07/02/2023    8:13 AM 07/01/2023    8:51 PM 09/27/2017   10:13 AM  CMP  Glucose 70 - 99 mg/dL  782  956   BUN 6 - 20 mg/dL  8  8   Creatinine 2.13 - 1.00 mg/dL  0.86  5.78   Sodium 469 - 145 mmol/L 139  140  136   Potassium 3.5 - 5.1 mmol/L 3.7  3.7  3.8   Chloride 98 - 111 mmol/L  99  103   CO2 22 - 32 mmol/L  26  23   Calcium 8.9 - 10.3 mg/dL  8.9  9.3   Total Protein 6.5 - 8.1 g/dL  6.5    Total Bilirubin 0.3 - 1.2 mg/dL  0.2    Alkaline Phos 38 - 126 U/L  65    AST 15 - 41 U/L  16    ALT 0 - 44 U/L  13      DG Chest Portable 1 View  Result Date: 07/02/2023 CLINICAL DATA:  Hypoxia EXAM: PORTABLE CHEST 1 VIEW COMPARISON:  Chest x-ray dated December 16, 2014 FINDINGS: Cardiac and mediastinal contours within normal limits. Mild bibasilar opacities. No evidence of pleural effusion pneumothorax. IMPRESSION: Mild bibasilar opacities, likely atelectasis. Electronically Signed   By: Allegra Lai M.D.   On: 07/02/2023 09:22     EKG: personally reviewed my interpretation is sinus tachycardia, no ST changes  CXR: personally reviewed my interpretation is normal CXR, no infiltrate noted  Assessment & Plan by Problem:  Present on Admission:  Overdose of drug/medicinal substance   Polysubstance Overdose Likely benzodiazapine overdose. Treatment with  supportive care. Some response to naloxone so suspect con-comitant opioid overdose as well.. She is HDS. BP is soft but suspect secondary to benzodiazapine use. Will admit to progressive for close monitoring. Lactic acid on iStat is normal but will check plasma lactic acid. VBG normal. UDS showed benzodiazapine and cocaine use. Cocaine use appears remote.  Start COWS monitoring and hold home suboxone for now.  Will need to be ordered once pt is at baseline.   Chronic Back Pain: treat conservatively with tylenol and lidocaine patch prn. Pt appears to be somnolent so currently no need for any therapies. Can restart home Suboxone once pt back to baseline.    DVT prophx: Lovenox Diet: NPO Bowel: PRN Code: Full  Prior to Admission Living Arrangement: Home Anticipated Discharge Location: Home Barriers to Discharge: Medical Workup  Dispo: Admit patient to Observation with expected length of stay less than 2 midnights.  Gwenevere Abbot, MD Eligha Bridegroom. [facility] Internal Medicine Residency, PGY-3 Pager: [phone] After 5 pm or weekends:  1st Contact: Pager: [phone]  2nd Contact: Pager: [phone]

## 2023-07-02 NOTE — ED Notes (Signed)
Admitting at bedside 

## 2023-07-03 DIAGNOSIS — E876 Hypokalemia: Secondary | ICD-10-CM

## 2023-07-03 DIAGNOSIS — T50901A Poisoning by unspecified drugs, medicaments and biological substances, accidental (unintentional), initial encounter: Secondary | ICD-10-CM

## 2023-07-03 DIAGNOSIS — G8929 Other chronic pain: Secondary | ICD-10-CM

## 2023-07-03 DIAGNOSIS — M549 Dorsalgia, unspecified: Secondary | ICD-10-CM | POA: Diagnosis not present

## 2023-07-03 DIAGNOSIS — K5900 Constipation, unspecified: Secondary | ICD-10-CM

## 2023-07-03 LAB — CBC
HCT: 33.3 % — ABNORMAL LOW (ref 36.0–46.0)
Hemoglobin: 10.7 g/dL — ABNORMAL LOW (ref 12.0–15.0)
MCH: 29.3 pg (ref 26.0–34.0)
MCHC: 32.1 g/dL (ref 30.0–36.0)
MCV: 91.2 fL (ref 80.0–100.0)
Platelets: 333 10*3/uL (ref 150–400)
RBC: 3.65 MIL/uL — ABNORMAL LOW (ref 3.87–5.11)
RDW: 13.8 % (ref 11.5–15.5)
WBC: 7.9 10*3/uL (ref 4.0–10.5)
nRBC: 0 % (ref 0.0–0.2)

## 2023-07-03 LAB — BASIC METABOLIC PANEL
Anion gap: 8 (ref 5–15)
BUN: 5 mg/dL — ABNORMAL LOW (ref 6–20)
CO2: 26 mmol/L (ref 22–32)
Calcium: 8.5 mg/dL — ABNORMAL LOW (ref 8.9–10.3)
Chloride: 103 mmol/L (ref 98–111)
Creatinine, Ser: 0.61 mg/dL (ref 0.44–1.00)
GFR, Estimated: >60 mL/min (ref >60)
Glucose, Bld: 86 mg/dL (ref 70–99)
Potassium: 3.2 mmol/L — ABNORMAL LOW (ref 3.5–5.1)
Sodium: 137 mmol/L (ref 135–145)

## 2023-07-03 MED ORDER — POTASSIUM CHLORIDE CRYS ER 20 MEQ PO TBCR
40.0000 meq | EXTENDED_RELEASE_TABLET | Freq: Once | ORAL | Status: AC
  Administered 2023-07-03: 40 meq via ORAL
  Filled 2023-07-03: qty 2

## 2023-07-03 MED ORDER — LIDOCAINE 5 % EX PTCH
1.0000 | MEDICATED_PATCH | CUTANEOUS | Status: DC
  Filled 2023-07-03 (×2): qty 1

## 2023-07-03 NOTE — Progress Notes (Signed)
Summary: Pt is a 42 year old female with hx of chronic back pain and polysubstance use presented to the ED via EMS after drug overdose. She responded to naloxone given by EMS and stated she took a benzo pill from friend. States her friend "turned blue" after taking it and she called 911. She took her suboxone but states at normal dose. EMS 1g narcan IM, multiple doses of naloxone with improvement in ED. MAP > 65, lactic acid normal. UDS positive for benzodiazepines and cocaine. On home suboxone since 2018 (per chart).   PCP Dr. Effie Shy  Subjective:  Patient is resting in bed in no acute distress. Became emotional speaking about yesterday's incident and expressed worry about her friend.   Patient denies CP, heart palpitations, shortness of breath, chills, tremors, N/V, abdominal pain, problems with urination, constipation, or diarrhea. Was able to eat breakfast without issue. Is tolerating liquids and regular diet.   Patient stated she did not want her mother to get more than a general update. Preferred to speak with her mother first. Patient's mother is in possession of patient's cell phone.   Objective:  Vital signs in last 24 hours: Vitals:   07/03/23 0825 07/03/23 0956 07/03/23 1054 07/03/23 1100  BP:   111/74   Pulse: 79 73 86   Resp: 13 15 18    Temp:   98.4 F (36.9 C)   TempSrc:   Oral Oral  SpO2: 95% 96% 97%   Weight:      Height:          Latest Ref Rng & Units 07/03/2023    8:10 AM 07/02/2023    8:13 AM 07/01/2023    8:51 PM  CBC  WBC 4.0 - 10.5 K/uL 7.9   12.2   Hemoglobin 12.0 - 15.0 g/dL 08.6  57.8  46.9   Hematocrit 36.0 - 46.0 % 33.3  32.0  34.6   Platelets 150 - 400 K/uL 333   431        Latest Ref Rng & Units 07/03/2023    8:10 AM 07/02/2023   12:00 PM 07/02/2023    8:13 AM  BMP  Glucose 70 - 99 mg/dL 86  629    BUN 6 - 20 mg/dL 5  <5    Creatinine 5.28 - 1.00 mg/dL 4.13  2.44    Sodium 010 - 145 mmol/L 137  137  139   Potassium 3.5 - 5.1 mmol/L 3.2   3.6  3.7   Chloride 98 - 111 mmol/L 103  107    CO2 22 - 32 mmol/L 26  25    Calcium 8.9 - 10.3 mg/dL 8.5  7.9      Pregnancy test: negative MRSA: negative HIV: non-reactive Hep panel: negative Lactic acid plasma: 1.1 WNL  TSH: 0.532 GC/chlamydia: pending collection   Physical Exam Constitutional: Patient is resting in no acute distress; conversing appropriately CV: RRR, no m/r/g, no LE edema  Pulmonary/Respiratory: Improved breath sounds posterior lungs; normal respiratory effort on room air  Abdominal: Soft, non-tender, non-distended; positive bowel sounds Neuro: Alert and oriented to self, place, and situation  Skin: Warm and dry Psych: Normal mood and affect, intermittently sad about situation   Assessment/Plan: Pt is a 42 year old female with hx of chronic back pain and polysubstance use admitted for drug overdose.   Principal Problem:   Overdose of drug/medicinal substance  Polysubstance Overdose Somnolent on arrival and initially hypotensive. EKG on admission with sinus tachycardia and no  ST changes. Likely benzodiazapine overdose. UDS positive for benzos and cocaine. Some response to naloxone, suspected concomitant opioid overdose not detected on UDS. On suboxone therapy since 2018. Treating overdose with supportive care.   Patient made NPO at admission due to risk for aspiration pneumonia, given 3g Unasyn. CXR with mild bibasilar opacities. No cough in room, patient drinking liquids without coughing, choking, or aspirating. Patient is afebrile and HDS. Will monitor.    Serum plasma lactic acid, I-STAT lactic acid, and VBG all normal. Started COWS monitoring on admission and held home suboxone. Most recent COWS 0. Per patient, suboxone available at home. Patient appears to be back to baseline, but restarting suboxone now may precipitate withdrawal. Will wait for patient's COWS to go up slightly or show symptoms of beginning a withdrawal and will resume home suboxone then.    Plan:  - Regular diet - Wait for patient's COWS to increase slightly, then restart home suboxone  - Discontinued LR infusion    Hypokalemia K 3.2 today, down from 3.6 yesterday.  - Ordered PO potassium 40 mEq - Monitor, replete as needed   Chronic Back Pain: treat conservatively with tylenol and lidocaine patch prn. Pt appears to be somnolent so currently no need for any therapies.  - Tylenol 650 mg q6H PRN  - Lidocaine 5% patch for pack   Constipation Not complaining of this today, but will provide Senokot tablet PRN as needed.   Diet: Regular Prior to Admission Living Arrangement: Home with son Anticipated Discharge Location: Home Barriers to Discharge: Medical management Dispo: Anticipated discharge in approximately 1 day(s).  DVT prophylaxis: Lovenox  Philomena Doheny, MD, PGY-1 07/03/2023, 2:14 PM Pager: [phone] After 5pm on weekdays and 1pm on weekends: On Call pager [phone]

## 2023-07-03 NOTE — Hospital Course (Addendum)
Pt is a 42 year old female with hx of chronic back pain and polysubstance use presented to the ED via EMS after drug overdose. She responded to naloxone given by EMS and stated she took a benzo pill from friend. States her friend "turned blue" after taking it and she called 911. She took her suboxone but states at normal dose. EMS 1g narcan IM, multiple doses of naloxone with improvement in ED. MAP > 65, lactic acid normal. UDS positive for benzodiazepines and cocaine. On home suboxone since 2018 (per chart). Patient remained somnolent on day of admission.   #Polysubstance Overdose Somnolent on arrival and initially hypotensive. EKG on admission with sinus tachycardia and no ST changes. Likely benzodiazapine overdose. UDS positive for benzodiazepines and cocaine. Opioids not detected on UDS but suspect concomitant opioid overdose due some response to naloxone. On suboxone therapy since 2018. Patient received supportive care to treat overdose. Initial concern for aspiration pneumonia with CXR with mild bibasilar opacities. Patient made NPO and given 3 g Unasyn. Started on LR infusion. Patient remained afebrile, no cough, and no signs of choking or aspiration. Patient's diet was advanced as tolerated.    Serum plasma lactic acid, I-STAT lactic acid, and VBG all normal. Started COWS monitoring on admission and held home suboxone. Morning after admission patient appeared to be at baseline with COWS 0, LR infusion stopped. Per patient, suboxone available at home. Waited for COWS score > 10 to resume home suboxone dose to avoid precipitating a withdrawal.    #Hypokalemia Patient with K 3.6, 3.2 on 7/20. Ordered PO potassium 40 mEq. Continued to monitor until discharge.    #Chronic Back Pain Treated conservatively with Tylenol 650 mg q6H PRN and lidocaine 5% patch PRN.    #Constipation Not complaining of this day after admission, but provided Senokot tablet PRN as needed.

## 2023-07-03 NOTE — Plan of Care (Signed)

## 2023-07-04 DIAGNOSIS — T50901A Poisoning by unspecified drugs, medicaments and biological substances, accidental (unintentional), initial encounter: Secondary | ICD-10-CM | POA: Diagnosis not present

## 2023-07-04 DIAGNOSIS — M549 Dorsalgia, unspecified: Secondary | ICD-10-CM | POA: Diagnosis not present

## 2023-07-04 DIAGNOSIS — G8929 Other chronic pain: Secondary | ICD-10-CM | POA: Diagnosis not present

## 2023-07-04 DIAGNOSIS — E876 Hypokalemia: Secondary | ICD-10-CM | POA: Diagnosis not present

## 2023-07-04 LAB — CBC
HCT: 32.2 % — ABNORMAL LOW (ref 36.0–46.0)
Hemoglobin: 10.5 g/dL — ABNORMAL LOW (ref 12.0–15.0)
MCH: 29 pg (ref 26.0–34.0)
MCHC: 32.6 g/dL (ref 30.0–36.0)
MCV: 89 fL (ref 80.0–100.0)
Platelets: 356 10*3/uL (ref 150–400)
RBC: 3.62 MIL/uL — ABNORMAL LOW (ref 3.87–5.11)
RDW: 13.4 % (ref 11.5–15.5)
WBC: 7 10*3/uL (ref 4.0–10.5)
nRBC: 0 % (ref 0.0–0.2)

## 2023-07-04 LAB — BASIC METABOLIC PANEL
Anion gap: 4 — ABNORMAL LOW (ref 5–15)
BUN: <5 mg/dL — ABNORMAL LOW (ref 6–20)
CO2: 27 mmol/L (ref 22–32)
Calcium: 8.2 mg/dL — ABNORMAL LOW (ref 8.9–10.3)
Chloride: 106 mmol/L (ref 98–111)
Creatinine, Ser: 0.57 mg/dL (ref 0.44–1.00)
GFR, Estimated: >60 mL/min (ref >60)
Glucose, Bld: 114 mg/dL — ABNORMAL HIGH (ref 70–99)
Potassium: 3.3 mmol/L — ABNORMAL LOW (ref 3.5–5.1)
Sodium: 137 mmol/L (ref 135–145)

## 2023-07-04 MED ORDER — BUPRENORPHINE HCL-NALOXONE HCL 8-2 MG SL SUBL
1.0000 | SUBLINGUAL_TABLET | Freq: Two times a day (BID) | SUBLINGUAL | Status: DC
  Administered 2023-07-04: 1 via SUBLINGUAL
  Filled 2023-07-04: qty 1

## 2023-07-04 MED ORDER — FOOD THICKENER (SIMPLYTHICK HONEY): 1.0000 | ORAL | Status: DC | PRN

## 2023-07-04 MED ORDER — POTASSIUM CHLORIDE CRYS ER 20 MEQ PO TBCR
40.0000 meq | EXTENDED_RELEASE_TABLET | Freq: Once | ORAL | Status: AC
  Administered 2023-07-04: 40 meq via ORAL
  Filled 2023-07-04: qty 2

## 2023-07-04 NOTE — Discharge Summary (Signed)
Name: Crystal Acosta MRN: [id number] DOB: Nov 28, 1981 42 y.o. PCP: Penelope Galas, MD  Date of Admission: 07/01/2023  8:43 PM Date of Discharge: 07/04/2023 6:44 PM Attending Physician: Dr. Criselda Peaches  Discharge Diagnosis: Principal Problem:   Overdose of drug/medicinal substance    Discharge Medications: Allergies as of 07/04/2023       Reactions   Questran [cholestyramine] Hives        Medication List     TAKE these medications    acetaminophen 500 MG tablet Commonly known as: TYLENOL Take 1,000 mg by mouth daily as needed for moderate pain or headache.   naloxone 4 MG/0.1ML Liqd nasal spray kit Commonly known as: NARCAN Administer into nose for suspected opioid overdose.   Suboxone 8-2 MG Film Generic drug: Buprenorphine HCl-Naloxone HCl Place 1 Film under the tongue in the morning and at bedtime.        Disposition and follow-up:   Ms.Chenel D Acosta was discharged from [facility] in Stable condition.  At the hospital follow up visit please address:  1.  Follow-up:  Suboxone use - Patient admitted for substance overdose. Drug screen on admission was positive for benzodiazepines and cocaine. Patient was treated with multiple rounds of naloxone/narcan. Followed COWS score and resumed home dose of suboxone. Patient reported compliance with home suboxone use, please follow-up on patient's access to treatment.    Narcan kit - Patient educated on use of benzodiazepines in combination with opioids, as well as the use of unlabeled or unprescribed medications. Provided with a Narcan nasal spray kit prescription. Suggest continued education on polysubstance use.    Hypokalemia - Patient received IV fluids and was NPO on admission. Potassium levels started to trend up on admission. Repeat BMP to assess electrolytes and replete as needed.    Chronic back pain  - Patient complaining of back pain, treated conservatively with PRN Tylenol and lidocaine  patch. Follow up on pain improvement and/or conservative pain management.    2.  Labs / imaging needed at time of follow-up: BMP  3.  Pending labs/ test needing follow-up: none  4.  Medication Changes  STOPPED     ADDED  - Naloxone/Narcan spray kit     MODIFIED   Follow-up Appointments:  Follow-up Information     Chow, Zannie Cove, MD. Schedule an appointment as soon as possible for a visit .   Specialty: Internal Medicine Contact information: [address] [place] [address] [phone]                 Hospital Course by problem list:  Summary Pt is a 42 y.o. with history of polysubstance use and chronic back pain who presented to the ED via EMS after drug overdose. She stated she took a benzo pill from a friend, friend turned blue and patient called 911. She reports taking her  prescribed dose suboxone that day. Patient responded to 1g narcan IM and multiple doses of naloxone by EMS with improvement in the ED. MAP > 65, lactic acid normal. UDS positive for benzodiazepines and cocaine. On home suboxone since 2018 (per chart). Patient remained somnolent on day of admission.   #Polysubstance Overdose Somnolent on arrival and initially hypotensive. EKG on admission with sinus tachycardia and no ST changes. Likely benzodiazapine overdose or effect of benzodiazepine with suboxone. Opioids not detected on UDS but suspect concomitant opioid overdose due some response to naloxone. Patient received supportive care to treat overdose. Initial concern for aspiration pneumonia  with CXR with mild bibasilar opacities. Patient made NPO and given 3 g Unasyn. Intermittently hypotensive. Started on LR infusion. Patient remained afebrile, no cough, and no signs of choking or aspiration. Patient's diet was advanced as tolerated.    Serum plasma lactic acid, I-STAT lactic acid, and VBG all normal. Started COWS monitoring on admission and held home suboxone. Morning after admission  patient appeared to be at baseline with COWS 0, LR infusion stopped. Per patient, suboxone available at home. Resumed home suboxone dose when patient began demonstrating signs of withdrawals and COWS > 3 to avoid precipitating a withdrawal.    #Hypokalemia Patient with K 3.6 on admission, then 3.2 on hospital day 1. Ordered PO potassium 40 mEq. Potassium improved 3.3. on day of discharge. No concerns on telemetry. Continue to monitor at outpatient follow-up.   #Chronic Back Pain Treated conservatively with Tylenol 650 mg q6H PRN and lidocaine 5% patch PRN.    #Constipation Not complaining of this day after admission, but provided Senokot tablet PRN as needed.   Discharge Subjective: No overnight events reported. Patient ready to go home. Feeling sad about the situation, continues to express worry about friend. Denies CP, SOB, headache, or N/V. Endorses starting to get some tremors and anxiety around withdrawal. Patient looking forward to resuming suboxone. Reviewed importance of being consistent with suboxone therapy and avoiding polysubstance use.   Discharge Exam:   Blood pressure 111/63, 119/73, pulse 75, 83, temperature 98.9 F (37.2 C), temperature source Oral, resp. rate 14, height 5\' 6"  (1.676 m), weight 79 kg, SpO2 92%.  Constitutional: Resting in bed, under covers (cold), in no acute distress HENT: Normocephalic atraumatic, mucous membranes moist Cardiovascular: Regular rate and rhythm, no m/r/g, no LE edema Pulmonary/Chest: Normal work of breathing on room air, lungs clear to auscultation bilaterally. Abdominal: Soft, non-tender, non-distended. Positive bowel sounds.  Neurological: Alert & oriented to self, place, and situation.  MSK: No gross abnormalities.  Skin: Warm and dry. Psych: Normal mood and affect  Pertinent Labs, Studies, and Procedures:     Latest Ref Rng & Units 07/04/2023    2:08 AM 07/03/2023    8:10 AM 07/02/2023    8:13 AM  CBC  WBC 4.0 - 10.5 K/uL 7.0  7.9     Hemoglobin 12.0 - 15.0 g/dL 16.1  09.6  04.5   Hematocrit 36.0 - 46.0 % 32.2  33.3  32.0   Platelets 150 - 400 K/uL 356  333         Latest Ref Rng & Units 07/04/2023    2:08 AM 07/03/2023    8:10 AM 07/02/2023   12:00 PM  CMP  Glucose 70 - 99 mg/dL 409  86  811   BUN 6 - 20 mg/dL <5  5  <5   Creatinine 0.44 - 1.00 mg/dL 9.14  7.82  9.56   Sodium 135 - 145 mmol/L 137  137  137   Potassium 3.5 - 5.1 mmol/L 3.3  3.2  3.6   Chloride 98 - 111 mmol/L 106  103  107   CO2 22 - 32 mmol/L 27  26  25    Calcium 8.9 - 10.3 mg/dL 8.2  8.5  7.9     DG Chest Portable 1 View  Result Date: 07/02/2023 CLINICAL DATA:  Hypoxia EXAM: PORTABLE CHEST 1 VIEW COMPARISON:  Chest x-ray dated December 16, 2014 FINDINGS: Cardiac and mediastinal contours within normal limits. Mild bibasilar opacities. No evidence of pleural effusion pneumothorax. IMPRESSION: Mild bibasilar opacities,  likely atelectasis. Electronically Signed   By: Allegra Lai M.D.   On: 07/02/2023 09:22     Discharge Instructions: Discharge Instructions     Call MD for:  difficulty breathing, headache or visual disturbances   Complete by: As directed    Call MD for:  extreme fatigue   Complete by: As directed    Call MD for:  hives   Complete by: As directed    Call MD for:  persistant dizziness or light-headedness   Complete by: As directed    Call MD for:  persistant nausea and vomiting   Complete by: As directed    Call MD for:  redness, tenderness, or signs of infection (pain, swelling, redness, odor or green/yellow discharge around incision site)   Complete by: As directed    Call MD for:  severe uncontrolled pain   Complete by: As directed    Call MD for:  temperature >100.4   Complete by: As directed    Diet general   Complete by: As directed    Discharge instructions   Complete by: As directed    PATIENT INSTRUCTIONS  - You were seen for substance overdose. Your urine drug screen on admission was positive for  benzodiazepines and cocaine. You were treated with multiple rounds of naloxone/narcan. We followed your COWS score, which measures acute opioid withdrawal. When it was safe to do so, we resumed your home dose of suboxone. Please continue to take your suboxone as prescribed.   - Please DO NOT take benzodiazepines while receiving suboxone treatment. Taking both at the same time can cause respiratory failure, impairment, unconsciousness, or even death.   - Narcan spray is a medication that can help reverse an opioid overdose. Please discuss this medication with your pharmacist and/or primary care provider.   - Your potassium levels were low on daily labs, and we repleted your potassium as needed. You will get follow-up labs at your appointment with Dr. Betsy Coder.   - Your chronic back pain was treated conservatively with tylenol 650 mg and a lidocaine patch. Please follow-up on your pain management with Dr. Betsy Coder.   - Please follow up with your primary care provider, Dr. Effie Shy, in the next 7-10 days for continued management of suboxone and follow-up regarding this admission. Confirm that you have your 07/16/2023 appointment.   - Please call our clinic during office hours (8:00 AM - 5 PM) at [phone] if you have any questions regarding your care. Call 911 if you are having a medical emergency, including any new fever, confusion, shortness of breath, chest pain, or withdrawal symptoms.   It was a pleasure serving you during your stay with [place]! - Dr. Justin Mend and team   Increase activity slowly   Complete by: As directed        Signed: Carigan Lister Colbert Coyer, MD [facility] - PGY1 Pager: [phone] 07/04/2023, 6:44 PM    Please contact the on call pager after 5 pm and on weekends at [phone].

## 2023-07-27 ENCOUNTER — Ambulatory Visit: Payer: Medicaid Other | Admitting: Podiatry

## 2024-06-07 ENCOUNTER — Ambulatory Visit (INDEPENDENT_AMBULATORY_CARE_PROVIDER_SITE_OTHER): Admitting: Podiatry

## 2024-06-07 DIAGNOSIS — Z91199 Patient's noncompliance with other medical treatment and regimen due to unspecified reason: Secondary | ICD-10-CM

## 2024-06-07 NOTE — Progress Notes (Signed)
 No show
